# Patient Record
Sex: Female | Born: 1985 | Race: White | Hispanic: No | Marital: Married | Smoking: Former smoker
Health system: Southern US, Community
[De-identification: ages and names within clinical notes are randomized; demographics above are authoritative.]

## PROBLEM LIST (undated history)

## (undated) ENCOUNTER — Inpatient Hospital Stay: Payer: Self-pay

## (undated) DIAGNOSIS — E669 Obesity, unspecified: Secondary | ICD-10-CM

## (undated) DIAGNOSIS — A6000 Herpesviral infection of urogenital system, unspecified: Secondary | ICD-10-CM

## (undated) DIAGNOSIS — E049 Nontoxic goiter, unspecified: Secondary | ICD-10-CM

## (undated) DIAGNOSIS — F329 Major depressive disorder, single episode, unspecified: Secondary | ICD-10-CM

## (undated) DIAGNOSIS — O139 Gestational [pregnancy-induced] hypertension without significant proteinuria, unspecified trimester: Secondary | ICD-10-CM

## (undated) DIAGNOSIS — F909 Attention-deficit hyperactivity disorder, unspecified type: Secondary | ICD-10-CM

## (undated) DIAGNOSIS — F32A Depression, unspecified: Secondary | ICD-10-CM

## (undated) DIAGNOSIS — E66811 Obesity, class 1: Secondary | ICD-10-CM

## (undated) HISTORY — PX: THYROID SURGERY: SHX805

## (undated) HISTORY — PX: WISDOM TOOTH EXTRACTION: SHX21

## (undated) HISTORY — PX: BREAST ENHANCEMENT SURGERY: SHX7

## (undated) HISTORY — PX: DILATION AND CURETTAGE OF UTERUS: SHX78

## (undated) HISTORY — DX: Attention-deficit hyperactivity disorder, unspecified type: F90.9

---

## 2006-01-12 ENCOUNTER — Ambulatory Visit: Payer: Self-pay

## 2006-09-15 ENCOUNTER — Emergency Department (HOSPITAL_COMMUNITY): Admission: EM | Admit: 2006-09-15 | Discharge: 2006-09-15 | Payer: Self-pay | Admitting: Emergency Medicine

## 2006-09-22 ENCOUNTER — Emergency Department (HOSPITAL_COMMUNITY): Admission: EM | Admit: 2006-09-22 | Discharge: 2006-09-22 | Payer: Self-pay | Admitting: Emergency Medicine

## 2010-09-27 ENCOUNTER — Ambulatory Visit: Payer: Self-pay | Admitting: Otolaryngology

## 2010-10-12 ENCOUNTER — Ambulatory Visit: Payer: Self-pay | Admitting: Otolaryngology

## 2010-11-04 ENCOUNTER — Other Ambulatory Visit: Payer: Self-pay | Admitting: Otolaryngology

## 2011-08-09 ENCOUNTER — Other Ambulatory Visit: Payer: Self-pay

## 2011-08-16 ENCOUNTER — Ambulatory Visit: Payer: Self-pay | Admitting: Otolaryngology

## 2011-12-03 ENCOUNTER — Observation Stay: Payer: Self-pay | Admitting: Obstetrics and Gynecology

## 2011-12-03 LAB — COMPREHENSIVE METABOLIC PANEL
Alkaline Phosphatase: 131 U/L (ref 50–136)
Anion Gap: 14 (ref 7–16)
Calcium, Total: 8.5 mg/dL (ref 8.5–10.1)
Co2: 20 mmol/L — ABNORMAL LOW (ref 21–32)
EGFR (African American): >60
Glucose: 80 mg/dL (ref 65–99)
Osmolality: 285 (ref 275–301)
Potassium: 3.5 mmol/L (ref 3.5–5.1)
SGOT(AST): 25 U/L (ref 15–37)
SGPT (ALT): 33 U/L
Total Protein: 5.9 g/dL — ABNORMAL LOW (ref 6.4–8.2)

## 2011-12-03 LAB — CBC
Platelet: 199 10*3/uL (ref 150–440)
RBC: 3.64 10*6/uL — ABNORMAL LOW (ref 3.80–5.20)
RDW: 13.2 % (ref 11.5–14.5)
WBC: 9.4 10*3/uL (ref 3.6–11.0)

## 2011-12-04 ENCOUNTER — Ambulatory Visit: Payer: Self-pay

## 2011-12-04 LAB — URINALYSIS, COMPLETE
Bacteria: NONE SEEN
Bilirubin,UR: NEGATIVE
Blood: NEGATIVE
Glucose,UR: NEGATIVE mg/dL
Ketone: NEGATIVE
Leukocyte Esterase: NEGATIVE
Nitrite: NEGATIVE
Ph: 6
Protein: NEGATIVE
RBC,UR: NONE SEEN /HPF
Specific Gravity: 1.005
Squamous Epithelial: 2
WBC UR: NONE SEEN /HPF

## 2011-12-04 LAB — PROTEIN / CREATININE RATIO, URINE
Creatinine, Urine: 46.9 mg/dL (ref 30.0–125.0)
Protein, Random Urine: <5 mg/dL — ABNORMAL LOW (ref 0–12)

## 2011-12-05 ENCOUNTER — Ambulatory Visit: Payer: Self-pay | Admitting: Pediatric Cardiology

## 2011-12-06 ENCOUNTER — Encounter: Payer: Self-pay | Admitting: Pediatric Cardiology

## 2012-01-12 ENCOUNTER — Inpatient Hospital Stay: Payer: Self-pay

## 2012-01-12 LAB — PROTEIN / CREATININE RATIO, URINE: Protein, Random Urine: 267 mg/dL — ABNORMAL HIGH (ref 0–12)

## 2012-01-12 LAB — PIH PROFILE
Anion Gap: 9 (ref 7–16)
BUN: 9 mg/dL (ref 7–18)
Creatinine: 0.61 mg/dL (ref 0.60–1.30)
EGFR (Non-African Amer.): >60
MCH: 33.2 pg (ref 26.0–34.0)
MCV: 96 fL (ref 80–100)
Platelet: 173 10*3/uL (ref 150–440)
RBC: 3.73 10*6/uL — ABNORMAL LOW (ref 3.80–5.20)
RDW: 13 % (ref 11.5–14.5)
SGOT(AST): 26 U/L (ref 15–37)

## 2012-01-13 LAB — PROTEIN, URINE, 24 HOUR
Collection Hours: 24 hours
Total Volume: 5100 mL

## 2012-01-14 LAB — PIH PROFILE
BUN: 9 mg/dL (ref 7–18)
Creatinine: 0.59 mg/dL — ABNORMAL LOW (ref 0.60–1.30)
EGFR (African American): >60
EGFR (Non-African Amer.): >60
HCT: 37 % (ref 35.0–47.0)
MCH: 32.7 pg (ref 26.0–34.0)
MCHC: 33.7 g/dL (ref 32.0–36.0)
MCV: 97 fL (ref 80–100)
Potassium: 4 mmol/L (ref 3.5–5.1)
RDW: 13.4 % (ref 11.5–14.5)
SGOT(AST): 31 U/L (ref 15–37)
Uric Acid: 5.5 mg/dL (ref 2.6–6.0)

## 2012-01-16 LAB — CBC WITH DIFFERENTIAL/PLATELET
Basophil %: 0.1 %
Eosinophil %: 0.1 %
HCT: 39.8 % (ref 35.0–47.0)
HGB: 13.3 g/dL (ref 12.0–16.0)
Lymphocyte #: 2.2 10*3/uL (ref 1.0–3.6)
Lymphocyte %: 15.9 %
MCH: 32.6 pg (ref 26.0–34.0)
Monocyte #: 0.7 x10 3/mm (ref 0.2–0.9)
Neutrophil #: 10.8 10*3/uL — ABNORMAL HIGH (ref 1.4–6.5)
Neutrophil %: 78.9 %
RDW: 13.7 % (ref 11.5–14.5)

## 2012-01-18 LAB — HEMATOCRIT: HCT: 31.1 % — ABNORMAL LOW (ref 35.0–47.0)

## 2012-01-20 LAB — BASIC METABOLIC PANEL
Anion Gap: 10 (ref 7–16)
BUN: 10 mg/dL (ref 7–18)
Calcium, Total: 8.2 mg/dL — ABNORMAL LOW (ref 8.5–10.1)
Co2: 23 mmol/L (ref 21–32)
EGFR (Non-African Amer.): >60
Glucose: 93 mg/dL (ref 65–99)
Osmolality: 284 (ref 275–301)
Potassium: 3.7 mmol/L (ref 3.5–5.1)

## 2012-01-20 LAB — CBC WITH DIFFERENTIAL/PLATELET
Basophil %: 0.3 %
Eosinophil #: 0.1 10*3/uL (ref 0.0–0.7)
Eosinophil %: 0.7 %
HGB: 11 g/dL — ABNORMAL LOW (ref 12.0–16.0)
Lymphocyte %: 25.6 %
MCH: 33.6 pg (ref 26.0–34.0)
MCV: 98 fL (ref 80–100)
Monocyte #: 0.6 x10 3/mm (ref 0.2–0.9)
Neutrophil #: 5.8 10*3/uL (ref 1.4–6.5)
Platelet: 202 10*3/uL (ref 150–440)
RBC: 3.28 10*6/uL — ABNORMAL LOW (ref 3.80–5.20)
RDW: 13.6 % (ref 11.5–14.5)
WBC: 8.8 10*3/uL (ref 3.6–11.0)

## 2012-01-20 LAB — HEPATIC FUNCTION PANEL A (ARMC)
Albumin: 2.2 g/dL — ABNORMAL LOW (ref 3.4–5.0)
Alkaline Phosphatase: 143 U/L — ABNORMAL HIGH (ref 50–136)
Bilirubin,Total: 0.2 mg/dL (ref 0.2–1.0)
SGPT (ALT): 108 U/L — ABNORMAL HIGH
Total Protein: 5.5 g/dL — ABNORMAL LOW (ref 6.4–8.2)

## 2012-01-21 LAB — HEPATIC FUNCTION PANEL A (ARMC)
Alkaline Phosphatase: 158 U/L — ABNORMAL HIGH (ref 50–136)
SGPT (ALT): 99 U/L — ABNORMAL HIGH
Total Protein: 6.1 g/dL — ABNORMAL LOW (ref 6.4–8.2)

## 2013-09-30 ENCOUNTER — Ambulatory Visit: Payer: Self-pay | Admitting: Podiatry

## 2014-09-12 NOTE — L&D Delivery Note (Signed)
Delivery Note At 5:10 PM a viable female was delivered via C-Section, Low Transverse (Presentation: complete breech).  APGAR: 8, 9; weight 7 lb 2.6 oz (3250 g).   Placenta status: Intact, Manual removal.  Cord:  with the following complications: .  Cord pH: venous 7.20, arterial 7.15   Anesthesia: Spinal  Est. Blood Loss (mL): 600cc  See op note  For details  Mom to postpartum.  Baby to Couplet care / Skin to Skin.  ----- Ranae Plumber, MD Attending Obstetrician and Gynecologist [facility]

## 2014-11-04 LAB — OB RESULTS CONSOLE GC/CHLAMYDIA
Chlamydia: NEGATIVE
GC PROBE AMP, GENITAL: NEGATIVE

## 2014-11-13 LAB — OB RESULTS CONSOLE RUBELLA ANTIBODY, IGM: RUBELLA: UNDETERMINED

## 2014-11-13 LAB — OB RESULTS CONSOLE RPR: RPR: NONREACTIVE

## 2014-11-13 LAB — OB RESULTS CONSOLE HEPATITIS B SURFACE ANTIGEN: HEP B S AG: NEGATIVE

## 2014-11-13 LAB — OB RESULTS CONSOLE VARICELLA ZOSTER ANTIBODY, IGG: VARICELLA IGG: IMMUNE

## 2014-11-13 LAB — OB RESULTS CONSOLE HIV ANTIBODY (ROUTINE TESTING): HIV: NONREACTIVE

## 2015-01-04 NOTE — Consult Note (Signed)
PATIENT NAME:  Kimberly Knapp, Kimberly Knapp MR#:  [id number] DATE OF BIRTH:  Jan 06, 1986  DATE OF CONSULTATION:  01/20/2012  REFERRING PHYSICIAN:  Dr. Luella Cook from OB/GYN CONSULTING PHYSICIAN:  Krystal Eaton, MD  REASON FOR CONSULTATION: High blood pressure.    HISTORY OF PRESENT ILLNESS: Patient is a 29 year old Caucasian female with no history of hypertension who is postoperative day three of normal spontaneous vaginal delivery. Her pregnancy was complicated by preeclampsia with elevated blood pressure and proteinuria. This was started around 30 weeks pregnancy per patient. Patient was induced on Saturday and gave birth on Tuesday. Patient has had elevated blood pressure and currently is on labetalol. She is on 300 b.i.d. and it was increased yesterday to the current dose. Patient continues to have elevated blood pressure up to 180s/90s and medicine was consulted for further blood pressure management. Patient currently denies any chest pains, palpitations, shortness of breath, dizziness, abdominal pain. There no seizures. Patient did have a 24 hours of magnesium at least.   PAST MEDICAL HISTORY: History of thyroid nodule which was benign.   MEDICATIONS PREDELIVERY:  1. Prenatal vitamins. 2. Valtrex.  CURRENT MEDICATIONS: Currently she is on: 1. Colace 100 mg b.i.d.  2. Labetalol 300 mg b.i.d.  3. Prenatal vitamin 1 tab daily.  4. Ambien 10 mg at bedtime.  5. Tylenol p.r.n.  6. Norco 1 to 2 tabs q.4-6 hours p.r.n.  7. Magnesium hydroxide 30 mL p.r.n. for constipation. 8. Preparation H suppository one suppository rectally t.i.d. p.r.n. for hemorrhoids. 9. Epifoam cream.   ALLERGIES: Iodine and shrimp.   SOCIAL HISTORY: Denies tobacco, alcohol or drug use.   FAMILY HISTORY: Several family members with preeclampsia including her mom and aunt. History of lung cancer and heart disease in granddad.   REVIEW OF SYSTEMS: CONSTITUTIONAL: No fever, fatigue. No weakness. EYES: No blurry vision, double  vision. ENT: No headaches, hearing loss, or tinnitus. RESPIRATORY: No cough, wheezing, shortness of breath or chest pains. CARDIOVASCULAR: No chest pain, orthopnea. Had increased lower extremity edema during pregnancy. GASTROINTESTINAL: No nausea, vomiting, diarrhea. No abdominal pain. ENDOCRINE: Denies polyuria, nocturia. HEME/LYMPH: No anemia or easy bruising. SKIN: No rashes. MUSCULOSKELETAL: No arthritis. NEUROLOGIC: Denies numbness, transient ischemic attack, or CVA. PSYCHIATRIC: Denies anxiety or insomnia.   PHYSICAL EXAMINATION:  VITAL SIGNS: Temperature 97.8, pulse rate 73, respiratory rate 18, blood pressure 185/91, last was 163/81, highest diastolic noted to be 102 early afternoon, oxygen sats 98% on room air.   GENERAL: Patient is a mildly obese Caucasian female, lying in bed, no obvious distress, talking in full sentences.   HEENT: Normocephalic, atraumatic. Anicteric sclerae. Moist mucous membranes.   NECK: Supple. No JVD.   CARDIOVASCULAR: S1, S2. No murmurs, rubs, or gallops.   LUNGS: Clear to auscultation without wheezing, rhonchi.   ABDOMEN: Soft, nontender, nondistended. Positive hypoactive bowel sounds in all quadrants.   EXTREMITIES: 2+ pitting edema to below the knees.  NEUROLOGICAL: Cranial nerves II through XII grossly intact. Strength is 5/5 in all extremities.   PSYCH: Awake, alert, oriented x3, pleasant.   LABORATORY, DIAGNOSTIC AND RADIOLOGICAL DATA: Creatinine 0.59 on 05/04. Chloride 112. Hematocrit on 05/08 was 31.1, it was a 39.8 05/06. Platelets 208 as of 05/06. 24 hour urine creatinine was elevated at 1142. Urine creatinine spot was 22.4. 24 hour urine protein was 2907. RPR nonreactive from 05/06.  ASSESSMENT AND PLAN: We have a 29 year old Caucasian female status post spontaneous vaginal delivery of baby girl with complicated pregnancy by preeclampsia who has had elevated blood pressures  since week 30 of pregnancy. Patient still has elevated blood pressure  currently and is on labetalol. At this point we would increase labetalol to 300 mg every eight hours and can increase this further depending if heart rate can tolerate. Patient has no symptoms of chest pains, dizziness, headaches, palpitations. If the heart rate cannot tolerate increased labetalol we can add a second agent of nifedipine and re-evaluate blood pressure to keep systolic less than 150 and diastolic less than 90. Would also check a CBC, BMP and liver function tests to evaluate for possible sequelae of the hypertension. Patient did receive magnesium for the preeclampsia. High blood pressure post delivery and in a preeclamptic patient could be present for weeks after delivery. The blood pressure should be monitored here and also post discharge within one week and medications titrated as needed. Would defer to OB/GYN for further care.   Thank you for the opportunity to see this patient and would follow along with you.  ____________________________ Krystal EatonShayiq Tanisha Lutes, MD sa:cms D: 01/20/2012 20:00:36 ET T: 01/21/2012 14:33:33 ET JOB#: [id number]  cc: Krystal EatonShayiq Selenne Coggin, MD, <Dictator> Deloris PingPhilip J. Luella Cookosenow, MD Krystal EatonSHAYIQ Tashara Suder MD ELECTRONICALLY SIGNED 01/27/2012 16:30

## 2015-01-20 NOTE — H&P (Signed)
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  She denies headaches, visual disturbances, and RUQ pain.  She has had a baseline 24-hour urine total protein on 3/19 that returned as 167mg .  Also, of note she was recently seen by [facility] perinatal for asymmetric fetal growth (head circumference <2%ile) and had noted fetal cardiac dysrythmia.  She notes +FM, no contractions, no vaginal bleeding and no leakage of fluid.O+, R Eq, VZI, RPR NR, NGsAg neg, GBS unknown.    Presents with increased BPs, some in severe range    Patient's Medical History HSV    Patient's Surgical History D&C  Breast augmentation, Neck Surgery, Wisdom Teeth Extraction    Medications Pre Natal Vitamins    Allergies Iodine, shellfish    Social History Former tobacco user    Family History Non-Contributory   ROS:   ROS All systems were reviewed.  HEENT, CNS, GI, GU, Respiratory, CV, Renal and Musculoskeletal systems were found to be normal.   Exam:   Vital Signs 126-152/67-90    General no apparent distress    Mental Status clear    Chest clear    Heart normal sinus rhythm    Abdomen gravid, non-tender    Estimated Fetal Weight Average for gestational age    Back no CVAT    Reflexes 2+    Mebranes Intact    FHT normal rate with no decels    FHT Description 135/mod var/+accels/no decels    Ucx absent   Impression:   Impression evaluation for gestational hypertension, early 3rd trimester   Plan:   Plan EFM/NST, monitor BP, PIH panel    Comments Will monitor BPs.  Given her report of severe-range blood pressures with minimal activity at home (folding clothes), I am concerned that she may quickly develop preeclampsia early in her pregnancy.  I will go ahead and administer betamethasone tonight in case she quickly progresses to severe preeclampsia.  Discussed  risks/benefits of bmtz at length with patient and her family.   Electronic Signatures: Conard NovakJackson, Clary Meeker D (MD)  (Signed 24-Mar-13 00:03)  Authored: L&D Evaluation   Last Updated: 24-Mar-13 00:03 by Conard NovakJackson, Monda Chastain D (MD)

## 2015-01-20 NOTE — H&P (Signed)
L&D Evaluation:  History:   HPI 29 [facility]4P0030 [facility] clinic for evaluation of PIH. BP in clinic 142/80, 2+ proteinuria and 5lbs weight gain in 1 week.  Patient reports HA but has had HA off and on throughout pregnancy.  These have been self limited and have not changed in character.  She denies vision changes, RUQ or epigastric pain, some increase in edema.  +FM, no LOF, no VB. no CTX.  Patient has been followed in high risk clinic for hyperthyroidsim and BMI 31.  She had an HSV outbreak at 19 weeks and continues on daily supression.  She had several elevated BP's some in severe range at 30 weeks.  Collected a 24-hr urine during that admission 167mg , s/p BMZ on 3/23 & 3/24. Fetus with PAC's evaluated by [facility], aslo US at 30 weeks with asymetric growth with small head circumference seen by DP and felt to be normal not requiring further work up.  PNL: O+ / ABSC neg / RI / VZI / RPR NR / HIV neg / HBsAg neg / 1-hr OGTT 112.  Last TSH 1.24 with FT4 of 1.05, Antithyroglobulin antibodies of <20IU/mL.    Presents with Evalutate for preeclampsia    Patient's Medical History Thyroid Disease  HSV    Patient's Surgical History D&C  breast augmentation, thryoid biopsy, wisdom teeth extraction    Medications Pre Natal Vitamins    Allergies Shellfish, iodine    Social History none    Family History Non-Contributory   Exam:   Vital Signs BP >140/90    Urine Protein 2+    General no apparent distress    Mental Status clear    Abdomen gravid, non-tender    Estimated Fetal Weight Average for gestational age    Fetal Position vtx    Edema 1+    Reflexes 2+    Clonus negative    FHT normal rate with no decels    Ucx absent   Impression:   Impression evaluation for PIH   Plan:   Comments 1) BP elevation - will obtain pre-eclampsia labs, pending review of serial BP's will decide on inpatient vs outpatient 24-hr urine protein  colection.  Addendum: PIH panel normal with exception of uric acid of 5.2 elevated for pregnancy as well as P/C ratio of 2151. Will admit for 24-hr urine nd serial BP's.  BP's have been running mild range 140-150/90's.   Electronic Signatures: Lorrene ReidStaebler, Larae Caison M (MD)  (Signed [phone]-May-13 18:09)  Authored: L&D Evaluation, Consent   Last Updated: 02-May-13 18:09 by Lorrene ReidStaebler, Gearold Wainer M (MD)

## 2015-05-19 LAB — OB RESULTS CONSOLE GBS: STREP GROUP B AG: NEGATIVE

## 2015-05-20 LAB — OB RESULTS CONSOLE GC/CHLAMYDIA
CHLAMYDIA, DNA PROBE: NEGATIVE
Gonorrhea: NEGATIVE

## 2015-05-27 ENCOUNTER — Encounter
Admission: RE | Admit: 2015-05-27 | Discharge: 2015-05-27 | Disposition: A | Discharge status: Home / Self Care | Payer: BLUE CROSS/BLUE SHIELD | Source: Ambulatory Visit | Attending: Obstetrics and Gynecology | Admitting: Obstetrics and Gynecology

## 2015-05-27 LAB — CBC
HCT: 35.9 % (ref 35.0–47.0)
Hemoglobin: 11.8 g/dL — ABNORMAL LOW (ref 12.0–16.0)
MCH: 29.9 pg (ref 26.0–34.0)
MCHC: 32.9 g/dL (ref 32.0–36.0)
MCV: 90.8 fL (ref 80.0–100.0)
PLATELETS: 255 10*3/uL (ref 150–440)
RBC: 3.95 MIL/uL (ref 3.80–5.20)
RDW: 12.9 % (ref 11.5–14.5)
WBC: 9.2 10*3/uL (ref 3.6–11.0)

## 2015-05-27 LAB — RAPID HIV SCREEN (HIV 1/2 AB+AG)
HIV 1/2 ANTIBODIES: NONREACTIVE
HIV-1 P24 ANTIGEN - HIV24: NONREACTIVE

## 2015-05-27 LAB — TYPE AND SCREEN
ABO/RH(D): O POS
Antibody Screen: NEGATIVE

## 2015-05-27 LAB — ABO/RH: ABO/RH(D): O POS

## 2015-05-27 NOTE — Procedures (Signed)
Kimberly Knapp   Your procedure is scheduled on: date 05/28/2015 at 10am   Report to Birthplace at [facility]             [address]             [place], [place] [address]  Call this number if you have problems the morning of surgery: [phone]   Remember:   Do not eat food or drink liquids after midnight.     Do not wear jewelry, make-up or nail polish.  Do not wear lotions, powders, or perfumes. You may wear deodorant.  Do not shave prior to surgery.  Do not bring valuables to the hospital.  [facility] is not responsible for any            belongings or valuables.                Contacts, dentures or bridgework may not be worn into surgery.  Leave suitcase in the car. After surgery it may be brought to your room.  For patients admitted to the hospital, discharge time is determined by your             treatment team.               Special Instructions: Preparing the skin before Cesarean Section              To help prevent the risk of infection at your surgical site, we are providing             you with rinse-free Sage 2% Chlorhexidine Gluconate (HCG) disposable             wipes.               The night before surgery:              1. Shower or bathe with warm water             2. Do not apply lotion or perfume             3. Wait one hour after shower, skin should be dry and cool             4. Open Sage wipe package - 2 disposable cloths are inside             5. Wipe the lower abdomen from the pubic line to the navel and hip bone to hip             bone with one cloth             6. Use the second cloth to wipe the front of the upper thighs             7. Allow the area to dry for one minute. DO NOT RINSE             8. Skin may feel "tacky" for several minutes             9. Dress in freshly laundered, clean clothes           10. Do not shower the morning of surgery    Please read over the following  fact sheets that you were given: Coughing and  Deep Breathing and Surgical Site Infection Prevention

## 2015-05-28 ENCOUNTER — Inpatient Hospital Stay: Payer: BLUE CROSS/BLUE SHIELD | Admitting: Anesthesiology

## 2015-05-28 ENCOUNTER — Encounter
Admission: RE | Disposition: A | Discharge status: Home / Self Care | Payer: Self-pay | Source: Ambulatory Visit | Attending: Obstetrics and Gynecology

## 2015-05-28 ENCOUNTER — Encounter: Payer: Self-pay | Admitting: Anesthesiology

## 2015-05-28 ENCOUNTER — Inpatient Hospital Stay
Admission: RE | Admit: 2015-05-28 | Discharge: 2015-05-31 | DRG: 765 | Disposition: A | Discharge status: Home / Self Care | Payer: BLUE CROSS/BLUE SHIELD | Source: Ambulatory Visit | Attending: Obstetrics and Gynecology | Admitting: Obstetrics and Gynecology

## 2015-05-28 DIAGNOSIS — Z8249 Family history of ischemic heart disease and other diseases of the circulatory system: Secondary | ICD-10-CM

## 2015-05-28 DIAGNOSIS — E669 Obesity, unspecified: Secondary | ICD-10-CM | POA: Diagnosis present

## 2015-05-28 DIAGNOSIS — Z888 Allergy status to other drugs, medicaments and biological substances status: Secondary | ICD-10-CM | POA: Diagnosis not present

## 2015-05-28 DIAGNOSIS — Z9889 Other specified postprocedural states: Secondary | ICD-10-CM

## 2015-05-28 DIAGNOSIS — Z87891 Personal history of nicotine dependence: Secondary | ICD-10-CM

## 2015-05-28 DIAGNOSIS — O321XX0 Maternal care for breech presentation, not applicable or unspecified: Secondary | ICD-10-CM | POA: Diagnosis present

## 2015-05-28 DIAGNOSIS — Z7982 Long term (current) use of aspirin: Secondary | ICD-10-CM | POA: Diagnosis not present

## 2015-05-28 DIAGNOSIS — Z3A37 37 weeks gestation of pregnancy: Secondary | ICD-10-CM | POA: Diagnosis present

## 2015-05-28 DIAGNOSIS — Z801 Family history of malignant neoplasm of trachea, bronchus and lung: Secondary | ICD-10-CM

## 2015-05-28 DIAGNOSIS — O133 Gestational [pregnancy-induced] hypertension without significant proteinuria, third trimester: Secondary | ICD-10-CM | POA: Diagnosis present

## 2015-05-28 DIAGNOSIS — Z833 Family history of diabetes mellitus: Secondary | ICD-10-CM

## 2015-05-28 DIAGNOSIS — O0990 Supervision of high risk pregnancy, unspecified, unspecified trimester: Secondary | ICD-10-CM

## 2015-05-28 DIAGNOSIS — Z6833 Body mass index (BMI) 33.0-33.9, adult: Secondary | ICD-10-CM

## 2015-05-28 DIAGNOSIS — D500 Iron deficiency anemia secondary to blood loss (chronic): Secondary | ICD-10-CM | POA: Diagnosis present

## 2015-05-28 DIAGNOSIS — Z79899 Other long term (current) drug therapy: Secondary | ICD-10-CM

## 2015-05-28 DIAGNOSIS — O99214 Obesity complicating childbirth: Secondary | ICD-10-CM | POA: Diagnosis present

## 2015-05-28 DIAGNOSIS — A6000 Herpesviral infection of urogenital system, unspecified: Secondary | ICD-10-CM | POA: Diagnosis present

## 2015-05-28 HISTORY — DX: Herpesviral infection of urogenital system, unspecified: A60.00

## 2015-05-28 HISTORY — DX: Gestational (pregnancy-induced) hypertension without significant proteinuria, unspecified trimester: O13.9

## 2015-05-28 HISTORY — DX: Nontoxic goiter, unspecified: E04.9

## 2015-05-28 HISTORY — DX: Depression, unspecified: F32.A

## 2015-05-28 HISTORY — DX: Obesity, class 1: E66.811

## 2015-05-28 HISTORY — DX: Obesity, unspecified: E66.9

## 2015-05-28 HISTORY — DX: Major depressive disorder, single episode, unspecified: F32.9

## 2015-05-28 LAB — RPR: RPR Ser Ql: NONREACTIVE

## 2015-05-28 SURGERY — Surgical Case
Anesthesia: Epidural

## 2015-05-28 MED ORDER — LACTATED RINGERS IV SOLN: 500.0000 mL | INTRAVENOUS | Status: DC | PRN

## 2015-05-28 MED ORDER — CITRIC ACID-SODIUM CITRATE 334-500 MG/5ML PO SOLN: 30.0000 mL | ORAL | Status: DC | PRN

## 2015-05-28 MED ORDER — BUPIVACAINE HCL 0.5 % IJ SOLN
5.0000 mL | Freq: Once | INTRAMUSCULAR | Status: DC
  Filled 2015-05-28: qty 5

## 2015-05-28 MED ORDER — ONDANSETRON HCL 4 MG/2ML IJ SOLN: 4.0000 mg | Freq: Four times a day (QID) | INTRAMUSCULAR | Status: DC | PRN

## 2015-05-28 MED ORDER — CALCIUM CARBONATE ANTACID 500 MG PO CHEW
CHEWABLE_TABLET | ORAL | Status: AC
  Administered 2015-05-28: 500 mg via ORAL
  Filled 2015-05-28: qty 2

## 2015-05-28 MED ORDER — DINOPROSTONE 10 MG VA INST
10.0000 mg | VAGINAL_INSERT | Freq: Once | VAGINAL | Status: AC
  Administered 2015-05-28: 10 mg via VAGINAL
  Filled 2015-05-28: qty 1

## 2015-05-28 MED ORDER — OXYTOCIN 10 UNIT/ML IJ SOLN: 10.0000 [IU] | Freq: Once | INTRAMUSCULAR | Status: DC

## 2015-05-28 MED ORDER — LACTATED RINGERS IV SOLN
Freq: Once | INTRAVENOUS | Status: AC
  Administered 2015-05-28: 07:00:00 via INTRAVENOUS

## 2015-05-28 MED ORDER — CALCIUM CARBONATE ANTACID 500 MG PO CHEW
1.0000 | CHEWABLE_TABLET | Freq: Three times a day (TID) | ORAL | Status: DC | PRN
Start: 2015-05-28 — End: 2015-05-31
  Administered 2015-05-28: 500 mg via ORAL

## 2015-05-28 MED ORDER — OXYTOCIN 40 UNITS IN LACTATED RINGERS INFUSION - SIMPLE MED
62.5000 mL/h | INTRAVENOUS | Status: DC
  Filled 2015-05-28 (×2): qty 1000

## 2015-05-28 MED ORDER — BUPIVACAINE 0.25 % ON-Q PUMP DUAL CATH 400 ML: 400.0000 mL | INJECTION | Status: DC

## 2015-05-28 MED ORDER — CITRIC ACID-SODIUM CITRATE 334-500 MG/5ML PO SOLN: 30.0000 mL | ORAL | Status: DC

## 2015-05-28 MED ORDER — OXYTOCIN BOLUS FROM INFUSION: 500.0000 mL | INTRAVENOUS | Status: DC

## 2015-05-28 MED ORDER — LACTATED RINGERS IV SOLN
INTRAVENOUS | Status: DC
  Administered 2015-05-28: 07:00:00 via INTRAVENOUS

## 2015-05-28 MED ORDER — CEFAZOLIN SODIUM-DEXTROSE 2-3 GM-% IV SOLR: 2.0000 g | INTRAVENOUS | Status: DC

## 2015-05-28 MED ORDER — TERBUTALINE SULFATE 1 MG/ML IJ SOLN: 0.2500 mg | Freq: Once | INTRAMUSCULAR | Status: DC | PRN

## 2015-05-28 MED ORDER — LIDOCAINE HCL (PF) 1 % IJ SOLN
30.0000 mL | INTRAMUSCULAR | Status: DC | PRN
  Filled 2015-05-28: qty 30

## 2015-05-28 MED ORDER — LACTATED RINGERS IV SOLN
INTRAVENOUS | Status: DC
  Administered 2015-05-29: 125 mL/h via INTRAVENOUS
  Administered 2015-05-29: 16:00:00 via INTRAVENOUS

## 2015-05-28 SURGICAL SUPPLY — 26 items
CANISTER SUCT 3000ML (MISCELLANEOUS) IMPLANT
CATH KIT ON-Q SILVERSOAK 5IN (CATHETERS) IMPLANT
DRSG TELFA 3X8 NADH (GAUZE/BANDAGES/DRESSINGS) IMPLANT
ELECT CAUTERY BLADE 6.4 (BLADE) IMPLANT
GAUZE SPONGE 4X4 12PLY STRL (GAUZE/BANDAGES/DRESSINGS) IMPLANT
GLOVE BIO SURGEON STRL SZ7 (GLOVE) IMPLANT
GLOVE INDICATOR 7.5 STRL GRN (GLOVE) IMPLANT
GOWN STRL REUS W/ TWL LRG LVL3 (GOWN DISPOSABLE) IMPLANT
GOWN STRL REUS W/TWL LRG LVL3 (GOWN DISPOSABLE)
LIQUID BAND (GAUZE/BANDAGES/DRESSINGS) IMPLANT
NS IRRIG 1000ML POUR BTL (IV SOLUTION) IMPLANT
PACK C SECTION AR (MISCELLANEOUS) IMPLANT
PAD GROUND ADULT SPLIT (MISCELLANEOUS) IMPLANT
PAD OB MATERNITY 4.3X12.25 (PERSONAL CARE ITEMS) IMPLANT
PAD PREP 24X41 OB/GYN DISP (PERSONAL CARE ITEMS) IMPLANT
SPONGE LAP 18X18 5 PK (GAUZE/BANDAGES/DRESSINGS) IMPLANT
STRIP CLOSURE SKIN 1/2X4 (GAUZE/BANDAGES/DRESSINGS) IMPLANT
SUT CHROMIC GUT BROWN 0 54 (SUTURE) IMPLANT
SUT CHROMIC GUT BROWN 0 54IN (SUTURE)
SUT MNCRL 4-0 (SUTURE)
SUT MNCRL 4-0 27XMFL (SUTURE)
SUT PDS AB 1 TP1 96 (SUTURE) IMPLANT
SUT PLAIN 2 0 XLH (SUTURE) IMPLANT
SUT VIC AB 0 CT1 36 (SUTURE) IMPLANT
SUTURE MNCRL 4-0 27XMF (SUTURE) IMPLANT
SWABSTK COMLB BENZOIN TINCTURE (MISCELLANEOUS) IMPLANT

## 2015-05-28 NOTE — H&P (Signed)
OB History & Physical   History of Present Illness:  Chief Complaint: Presents for external cephalic version and either induction or cesarean delivery  HPI:  Kimberly Knapp is a 29 y.o. [id number] female at [redacted]w[redacted]d dated by her LMP consistent with an 8 week ultrasound.  Her pregnancy has been complicated by obesity with an initial BMI of 33, a history of preeclampsia with severe features requiring induction at 35 weeks, depression, herpes genitalis, gestational hypertension in the current pregnancy..    She denies contractions.   She denies leakage of fluid.   She denies vginal bleeding.   She reports fetal movement.  She denies headaches, visual disturbances, and RUQ pain.  She denies any current symptoms of active or a prodromal outbreak of herpes.  Maternal Medical History:   Past Medical History  Diagnosis Date  . Pregnancy induced hypertension   . Depression   . Obesity (BMI 30.0-34.9)   . Goiter   . Herpes genitalis     Past Surgical History  Procedure Laterality Date  . Breast enhancement surgery Bilateral   . Dilation and curettage of uterus    . Thyroid surgery      thyroid biopsy  . Wisdom tooth extraction      Allergies  Allergen Reactions  . Iodine Swelling  . Shrimp [Shellfish Allergy] Swelling    Prior to Admission medications   Medication Sig Start Date End Date Taking? Authorizing Provider  aspirin 81 MG tablet Take 81 mg by mouth daily.   Yes Historical Provider, MD  Prenat w/o A-FeCbn-Meth-FA-DHA (PRENATE MINI PO) Take 1 tablet by mouth daily.   Yes Historical Provider, MD  ranitidine (ZANTAC) 150 MG capsule Take 150 mg by mouth 2 (two) times daily.   Yes Historical Provider, MD  valACYclovir (VALTREX) 1000 MG tablet Take 1,000 mg by mouth daily.   Yes Historical Provider, MD  Witch Hazel (PREPARATION H EX) Apply 1 application topically daily as needed.    Historical Provider, MD    OB History  Gravida Para Term Preterm AB SAB TAB Ectopic Multiple Living   5 1 0 # Outcome Date GA Lbr Len/2nd Weight Sex Delivery Anes PTL Lv  5 Current           4 SAB           3 SAB           2 SAB           1 Preterm               Prenatal care site: [facility]  Social History: She  reports that she has quit smoking. She has never used smokeless tobacco. She reports that she does not drink alcohol or use illicit drugs.  Family History: family history includes Diabetes type II in an other family member; Heart disease in an other family member; Lung cancer in some other family members.   Review of Systems: Negative x 10 systems reviewed except as noted in the HPI.    Physical Exam:  Vital Signs: BP 125/70 mmHg  Pulse 84  Temp(Src) 98 F (36.7 C) (Oral)  LMP 09/10/2014 General: no acute distress.  HEENT: normocephalic, atraumatic Heart: regular rate & rhythm.  No murmurs/rubs/gallops Lungs: clear to auscultation bilaterally Abdomen: soft, gravid, non-tender;  EFW: 7.5 pounds Pelvic:   External: Normal external female genitalia. No evidence of herpetic lesions.  Cervix: Dilation: Closed / Effacement (%):  50 / Station: -3  Extremities: non-tender, symmetric, trace edema bilaterally.  DTRs: 2+  Neurologic: Alert & oriented x 3.    Pertinent Results:  Prenatal Labs: Blood type/Rh O positive  Antibody screen negative  Rubella Not immune  RPR Non-reactive  HBsAg negative  HIV negative  GC negative  Chlamydia negative  Genetic screening Declined   1 hour GTT Early (88), 28 week 109  3 hour GTT n/a  GBS negative on 05/19/15   Baseline FHR: 130 beats/min   Variability: moderate   Accelerations: present   Decelerations: absent Contractions: present, frequency: infrequent, irregular Overall assessment: category 1  Bedside Ultrasound:  Number of Fetus: 1  Presentation: cephalic  Fluid: grossly normal  Placental Location: anterior  Assessment:  Kimberly Knapp is a 29 y.o. [id number] female at [redacted]w[redacted]d with gestational  hypertension.   Plan:  1. Admit to Labor & Delivery  2. CBC, T&S, NPO initially with likely transition to clears, IVF 3. GBS negative.   4. Fetwal well-being: reassuring  5. No need for external cephalic version as fetus is cephalic.  Will plan for induction of labor with either cervidil or cytotec.  Discussed that induction with cytotec is an off-label use of the medication.  Will leave to Dr. Bonney Aid as he is on-call today.  Patient is accepting of either method.   6. Herpes genitalis: no evidence of current outbreak and patient currently on suppressive medication.   Conard Novak, MD 05/28/2015 8:35 AM

## 2015-05-28 NOTE — Anesthesia Preprocedure Evaluation (Addendum)
Anesthesia Evaluation  Patient identified by MRN, date of birth, ID band Patient awake    Reviewed: Allergy & Precautions, H&P , NPO status , Patient's Chart, lab work & pertinent test results  History of Anesthesia Complications Negative for: history of anesthetic complications  Airway Mallampati: II  TM Distance: >3 FB Neck ROM: full    Dental no notable dental hx. (+) Teeth Intact   Pulmonary neg shortness of breath, former smoker,    Pulmonary exam normal breath sounds clear to auscultation       Cardiovascular Exercise Tolerance: Good hypertension, Normal cardiovascular exam Rhythm:regular Rate:Normal     Neuro/Psych PSYCHIATRIC DISORDERS Depression negative neurological ROS  negative psych ROS   GI/Hepatic Neg liver ROS, GERD  Controlled,  Endo/Other  negative endocrine ROS  Renal/GU negative Renal ROS  negative genitourinary   Musculoskeletal   Abdominal   Peds  Hematology negative hematology ROS (+)   Anesthesia Other Findings 29 y.o. [id number] female at [redacted]w[redacted]d dated by her LMP consistent with an 8 week ultrasound. Her pregnancy has been complicated by obesity with an initial BMI of 33, a history of preeclampsia with severe features requiring induction at 35 weeks, depression, herpes genitalis, gestational hypertension in the current pregnancy..   Reproductive/Obstetrics negative OB ROS                            Anesthesia Physical Anesthesia Plan  ASA: III  Anesthesia Plan: Spinal, Epidural and General   Post-op Pain Management:    Induction:   Airway Management Planned:   Additional Equipment:   Intra-op Plan:   Post-operative Plan:   Informed Consent: I have reviewed the patients History and Physical, chart, labs and discussed the procedure including the risks, benefits and alternatives for the proposed anesthesia with the patient or authorized representative who has  indicated his/her understanding and acceptance.   Dental Advisory Given  Plan Discussed with: Anesthesiologist, CRNA and Surgeon  Anesthesia Plan Comments:         Anesthesia Quick Evaluation

## 2015-05-29 ENCOUNTER — Inpatient Hospital Stay: Payer: BLUE CROSS/BLUE SHIELD | Admitting: Anesthesiology

## 2015-05-29 ENCOUNTER — Encounter: Payer: Self-pay | Admitting: Anesthesiology

## 2015-05-29 ENCOUNTER — Encounter
Admission: RE | Disposition: A | Discharge status: Home / Self Care | Payer: Self-pay | Source: Ambulatory Visit | Attending: Obstetrics and Gynecology

## 2015-05-29 LAB — COMPREHENSIVE METABOLIC PANEL
ALK PHOS: 195 U/L — AB (ref 38–126)
ALT: 17 U/L (ref 14–54)
ANION GAP: 6 (ref 5–15)
AST: 27 U/L (ref 15–41)
Albumin: 2.9 g/dL — ABNORMAL LOW (ref 3.5–5.0)
BILIRUBIN TOTAL: 0.7 mg/dL (ref 0.3–1.2)
BUN: 5 mg/dL — ABNORMAL LOW (ref 6–20)
CALCIUM: 8.8 mg/dL — AB (ref 8.9–10.3)
CO2: 21 mmol/L — AB (ref 22–32)
CREATININE: 0.58 mg/dL (ref 0.44–1.00)
Chloride: 110 mmol/L (ref 101–111)
Glucose, Bld: 75 mg/dL (ref 65–99)
Potassium: 3.3 mmol/L — ABNORMAL LOW (ref 3.5–5.1)
Sodium: 137 mmol/L (ref 135–145)
TOTAL PROTEIN: 6.2 g/dL — AB (ref 6.5–8.1)

## 2015-05-29 LAB — CBC
HEMATOCRIT: 35.1 % (ref 35.0–47.0)
HEMOGLOBIN: 11.6 g/dL — AB (ref 12.0–16.0)
MCH: 30 pg (ref 26.0–34.0)
MCHC: 33.2 g/dL (ref 32.0–36.0)
MCV: 90.4 fL (ref 80.0–100.0)
Platelets: 236 10*3/uL (ref 150–440)
RBC: 3.88 MIL/uL (ref 3.80–5.20)
RDW: 12.6 % (ref 11.5–14.5)
WBC: 9.7 10*3/uL (ref 3.6–11.0)

## 2015-05-29 SURGERY — Surgical Case
Anesthesia: Epidural | Site: Abdomen | Wound class: Clean Contaminated

## 2015-05-29 MED ORDER — FENTANYL CITRATE (PF) 100 MCG/2ML IJ SOLN
INTRAMUSCULAR | Status: DC | PRN
  Administered 2015-05-29: 15 ug via INTRATHECAL

## 2015-05-29 MED ORDER — SIMETHICONE 80 MG PO CHEW
80.0000 mg | CHEWABLE_TABLET | Freq: Three times a day (TID) | ORAL | Status: DC
Start: 2015-05-30 — End: 2015-05-31
  Administered 2015-05-30 – 2015-05-31 (×3): 80 mg via ORAL
  Filled 2015-05-29 (×3): qty 1

## 2015-05-29 MED ORDER — MISOPROSTOL 25 MCG QUARTER TABLET
25.0000 ug | ORAL_TABLET | ORAL | Status: DC | PRN
  Administered 2015-05-29: 25 ug via ORAL
  Filled 2015-05-29: qty 1

## 2015-05-29 MED ORDER — BUPIVACAINE HCL 0.5 % IJ SOLN: INTRAMUSCULAR | Status: DC | PRN

## 2015-05-29 MED ORDER — CITRIC ACID-SODIUM CITRATE 334-500 MG/5ML PO SOLN
30.0000 mL | ORAL | Status: AC
  Administered 2015-05-29: 30 mL via ORAL
  Filled 2015-05-29: qty 30

## 2015-05-29 MED ORDER — MEPERIDINE HCL 25 MG/ML IJ SOLN: 6.2500 mg | INTRAMUSCULAR | Status: DC | PRN

## 2015-05-29 MED ORDER — SODIUM CHLORIDE 0.9 % IJ SOLN: 3.0000 mL | INTRAMUSCULAR | Status: DC | PRN

## 2015-05-29 MED ORDER — BUPIVACAINE 0.25 % ON-Q PUMP DUAL CATH 400 ML
400.0000 mL | INJECTION | Status: DC
  Filled 2015-05-29: qty 400

## 2015-05-29 MED ORDER — ONDANSETRON HCL 4 MG/2ML IJ SOLN
4.0000 mg | Freq: Three times a day (TID) | INTRAMUSCULAR | Status: DC | PRN
Start: 2015-05-29 — End: 2015-05-29

## 2015-05-29 MED ORDER — NALBUPHINE HCL 10 MG/ML IJ SOLN: 5.0000 mg | Freq: Once | INTRAMUSCULAR | Status: DC | PRN

## 2015-05-29 MED ORDER — KETOROLAC TROMETHAMINE 30 MG/ML IJ SOLN
INTRAMUSCULAR | Status: AC
  Administered 2015-05-29: 30 mg via INTRAVENOUS
  Filled 2015-05-29: qty 8

## 2015-05-29 MED ORDER — MISOPROSTOL 25 MCG QUARTER TABLET: 25.0000 ug | ORAL_TABLET | ORAL | Status: DC | PRN

## 2015-05-29 MED ORDER — SIMETHICONE 80 MG PO CHEW: 80.0000 mg | CHEWABLE_TABLET | ORAL | Status: DC | PRN

## 2015-05-29 MED ORDER — OXYTOCIN 10 UNIT/ML IJ SOLN
INTRAMUSCULAR | Status: AC
Start: 2015-05-29 — End: 2015-05-29
  Filled 2015-05-29: qty 2

## 2015-05-29 MED ORDER — CEFAZOLIN SODIUM-DEXTROSE 2-3 GM-% IV SOLR
2.0000 g | INTRAVENOUS | Status: AC
  Administered 2015-05-29: 2 g via INTRAVENOUS
  Filled 2015-05-29: qty 50

## 2015-05-29 MED ORDER — OXYTOCIN 40 UNITS IN LACTATED RINGERS INFUSION - SIMPLE MED
INTRAVENOUS | Status: AC
  Filled 2015-05-29: qty 1000

## 2015-05-29 MED ORDER — PHENYLEPHRINE HCL 10 MG/ML IJ SOLN
INTRAMUSCULAR | Status: DC | PRN
  Administered 2015-05-29: 100 ug via INTRAVENOUS

## 2015-05-29 MED ORDER — LACTATED RINGERS IV SOLN
INTRAVENOUS | Status: DC
  Administered 2015-05-30: 06:00:00 via INTRAVENOUS

## 2015-05-29 MED ORDER — NALBUPHINE HCL 10 MG/ML IJ SOLN: 5.0000 mg | INTRAMUSCULAR | Status: DC | PRN

## 2015-05-29 MED ORDER — IBUPROFEN 600 MG PO TABS
600.0000 mg | ORAL_TABLET | Freq: Four times a day (QID) | ORAL | Status: DC | PRN
  Administered 2015-05-30 – 2015-05-31 (×4): 600 mg via ORAL
  Filled 2015-05-29 (×4): qty 1

## 2015-05-29 MED ORDER — MEASLES, MUMPS & RUBELLA VAC ~~LOC~~ INJ
0.5000 mL | INJECTION | Freq: Once | SUBCUTANEOUS | Status: AC
  Administered 2015-05-31: 0.5 mL via SUBCUTANEOUS
  Filled 2015-05-29: qty 0.5

## 2015-05-29 MED ORDER — BUPIVACAINE 0.5 % ON-Q PUMP DUAL CATH 300 ML
INJECTION | Status: DC | PRN
  Administered 2015-05-29: 300 mL

## 2015-05-29 MED ORDER — DIPHENHYDRAMINE HCL 25 MG PO CAPS: 25.0000 mg | ORAL_CAPSULE | ORAL | Status: DC | PRN

## 2015-05-29 MED ORDER — SIMETHICONE 80 MG PO CHEW
80.0000 mg | CHEWABLE_TABLET | ORAL | Status: DC
  Administered 2015-05-31: 80 mg via ORAL
  Filled 2015-05-29 (×2): qty 1

## 2015-05-29 MED ORDER — TERBUTALINE SULFATE 1 MG/ML IJ SOLN: 0.2500 mg | Freq: Once | INTRAMUSCULAR | Status: DC | PRN

## 2015-05-29 MED ORDER — MENTHOL 3 MG MT LOZG: 1.0000 | LOZENGE | OROMUCOSAL | Status: DC | PRN

## 2015-05-29 MED ORDER — SODIUM CHLORIDE 0.9 % IV SOLN
10000.0000 ug | INTRAVENOUS | Status: DC | PRN
  Administered 2015-05-29: 35 ug/min via INTRAVENOUS

## 2015-05-29 MED ORDER — LANOLIN HYDROUS EX OINT: 1.0000 "application " | TOPICAL_OINTMENT | CUTANEOUS | Status: DC | PRN

## 2015-05-29 MED ORDER — OXYTOCIN 40 UNITS IN LACTATED RINGERS INFUSION - SIMPLE MED
1.0000 m[IU]/min | INTRAVENOUS | Status: DC
  Administered 2015-05-29: 1 m[IU]/min via INTRAVENOUS

## 2015-05-29 MED ORDER — ONDANSETRON HCL 4 MG/2ML IJ SOLN: 4.0000 mg | Freq: Three times a day (TID) | INTRAMUSCULAR | Status: DC | PRN

## 2015-05-29 MED ORDER — ONDANSETRON HCL 4 MG/2ML IJ SOLN
INTRAMUSCULAR | Status: DC | PRN
  Administered 2015-05-29: 4 mg via INTRAVENOUS

## 2015-05-29 MED ORDER — SODIUM CHLORIDE 0.9 % IJ SOLN
INTRAMUSCULAR | Status: AC
  Filled 2015-05-29: qty 50

## 2015-05-29 MED ORDER — SCOPOLAMINE 1 MG/3DAYS TD PT72: 1.0000 | MEDICATED_PATCH | Freq: Once | TRANSDERMAL | Status: DC

## 2015-05-29 MED ORDER — OXYTOCIN 40 UNITS IN LACTATED RINGERS INFUSION - SIMPLE MED: 62.5000 mL/h | INTRAVENOUS | Status: AC

## 2015-05-29 MED ORDER — OXYTOCIN 40 UNITS IN LACTATED RINGERS INFUSION - SIMPLE MED
INTRAVENOUS | Status: DC | PRN
  Administered 2015-05-29: 1000 mL via INTRAVENOUS

## 2015-05-29 MED ORDER — PRENATAL MULTIVITAMIN CH
1.0000 | ORAL_TABLET | Freq: Every day | ORAL | Status: DC
  Administered 2015-05-30: 1 via ORAL
  Filled 2015-05-29: qty 1

## 2015-05-29 MED ORDER — KETOROLAC TROMETHAMINE 30 MG/ML IJ SOLN
30.0000 mg | Freq: Four times a day (QID) | INTRAMUSCULAR | Status: DC | PRN
  Administered 2015-05-29: 30 mg via INTRAVENOUS

## 2015-05-29 MED ORDER — MISOPROSTOL 25 MCG QUARTER TABLET
ORAL_TABLET | ORAL | Status: AC
  Filled 2015-05-29: qty 0.25

## 2015-05-29 MED ORDER — WITCH HAZEL-GLYCERIN EX PADS: 1.0000 "application " | MEDICATED_PAD | CUTANEOUS | Status: DC | PRN

## 2015-05-29 MED ORDER — OXYCODONE-ACETAMINOPHEN 5-325 MG PO TABS
2.0000 | ORAL_TABLET | ORAL | Status: DC | PRN
  Administered 2015-05-31 (×3): 2 via ORAL
  Filled 2015-05-29 (×2): qty 2

## 2015-05-29 MED ORDER — DIBUCAINE 1 % RE OINT: 1.0000 "application " | TOPICAL_OINTMENT | RECTAL | Status: DC | PRN

## 2015-05-29 MED ORDER — DIPHENHYDRAMINE HCL 25 MG PO CAPS: 25.0000 mg | ORAL_CAPSULE | Freq: Four times a day (QID) | ORAL | Status: DC | PRN

## 2015-05-29 MED ORDER — KETOROLAC TROMETHAMINE 30 MG/ML IJ SOLN: 30.0000 mg | Freq: Four times a day (QID) | INTRAMUSCULAR | Status: AC | PRN

## 2015-05-29 MED ORDER — NALOXONE HCL 1 MG/ML IJ SOLN: 1.0000 ug/kg/h | INTRAVENOUS | Status: DC | PRN

## 2015-05-29 MED ORDER — MORPHINE SULFATE (PF) 0.5 MG/ML IJ SOLN
INTRAMUSCULAR | Status: DC | PRN
  Administered 2015-05-29: .1 mg via INTRATHECAL

## 2015-05-29 MED ORDER — OXYCODONE-ACETAMINOPHEN 5-325 MG PO TABS
1.0000 | ORAL_TABLET | ORAL | Status: DC | PRN
  Administered 2015-05-30 (×3): 1 via ORAL
  Filled 2015-05-29 (×5): qty 1

## 2015-05-29 MED ORDER — SENNOSIDES-DOCUSATE SODIUM 8.6-50 MG PO TABS
2.0000 | ORAL_TABLET | ORAL | Status: DC
  Administered 2015-05-31: 2 via ORAL
  Filled 2015-05-29: qty 2

## 2015-05-29 MED ORDER — LIDOCAINE HCL (PF) 1 % IJ SOLN
INTRAMUSCULAR | Status: AC
  Filled 2015-05-29: qty 30

## 2015-05-29 MED ORDER — MISOPROSTOL 200 MCG PO TABS
ORAL_TABLET | ORAL | Status: AC
  Filled 2015-05-29: qty 4

## 2015-05-29 MED ORDER — DIPHENHYDRAMINE HCL 50 MG/ML IJ SOLN: 12.5000 mg | INTRAMUSCULAR | Status: DC | PRN

## 2015-05-29 MED ORDER — NALOXONE HCL 0.4 MG/ML IJ SOLN: 0.4000 mg | INTRAMUSCULAR | Status: DC | PRN

## 2015-05-29 MED ORDER — DIPHENHYDRAMINE HCL 50 MG/ML IJ SOLN
12.5000 mg | INTRAMUSCULAR | Status: DC | PRN
Start: 2015-05-29 — End: 2015-05-29

## 2015-05-29 MED ORDER — BUPIVACAINE ON-Q PAIN PUMP (FOR ORDER SET NO CHG)
INJECTION | Status: DC
  Filled 2015-05-29: qty 1

## 2015-05-29 MED ORDER — IBUPROFEN 600 MG PO TABS: 600.0000 mg | ORAL_TABLET | Freq: Four times a day (QID) | ORAL | Status: DC

## 2015-05-29 MED ORDER — AMMONIA AROMATIC IN INHA
RESPIRATORY_TRACT | Status: AC
  Filled 2015-05-29: qty 10

## 2015-05-29 MED ORDER — TETANUS-DIPHTH-ACELL PERTUSSIS 5-2.5-18.5 LF-MCG/0.5 IM SUSP
0.5000 mL | Freq: Once | INTRAMUSCULAR | Status: AC
  Administered 2015-05-31: 0.5 mL via INTRAMUSCULAR
  Filled 2015-05-29: qty 0.5

## 2015-05-29 MED ORDER — BUPIVACAINE HCL (PF) 0.5 % IJ SOLN
10.0000 mL | Freq: Once | INTRAMUSCULAR | Status: DC
  Filled 2015-05-29: qty 30

## 2015-05-29 MED ORDER — BUPIVACAINE IN DEXTROSE 0.75-8.25 % IT SOLN
INTRATHECAL | Status: DC | PRN
  Administered 2015-05-29: 1.6 mL via INTRATHECAL

## 2015-05-29 MED ORDER — KETOROLAC TROMETHAMINE 30 MG/ML IJ SOLN: 30.0000 mg | Freq: Four times a day (QID) | INTRAMUSCULAR | Status: DC | PRN

## 2015-05-29 MED ORDER — ACETAMINOPHEN 325 MG PO TABS: 650.0000 mg | ORAL_TABLET | ORAL | Status: DC | PRN

## 2015-05-29 SURGICAL SUPPLY — 28 items
CANISTER SUCT 3000ML (MISCELLANEOUS) ×2 IMPLANT
CATH KIT ON-Q SILVERSOAK 5IN (CATHETERS) ×4 IMPLANT
DRSG TELFA 3X8 NADH (GAUZE/BANDAGES/DRESSINGS) ×2 IMPLANT
ELECT CAUTERY BLADE 6.4 (BLADE) ×2 IMPLANT
GAUZE SPONGE 4X4 12PLY STRL (GAUZE/BANDAGES/DRESSINGS) ×2 IMPLANT
GLOVE BIOGEL PI IND STRL 6.5 (GLOVE) ×1 IMPLANT
GLOVE BIOGEL PI INDICATOR 6.5 (GLOVE) ×1
GLOVE SURG SYN 6.5 ES PF (GLOVE) ×2 IMPLANT
GOWN STRL REUS W/ TWL LRG LVL3 (GOWN DISPOSABLE) ×3 IMPLANT
GOWN STRL REUS W/TWL LRG LVL3 (GOWN DISPOSABLE) ×6
LIQUID BAND (GAUZE/BANDAGES/DRESSINGS) ×2 IMPLANT
NS IRRIG 1000ML POUR BTL (IV SOLUTION) ×2 IMPLANT
PACK C SECTION AR (MISCELLANEOUS) ×2 IMPLANT
PAD GROUND ADULT SPLIT (MISCELLANEOUS) ×2 IMPLANT
PAD OB MATERNITY 4.3X12.25 (PERSONAL CARE ITEMS) ×2 IMPLANT
PAD PREP 24X41 OB/GYN DISP (PERSONAL CARE ITEMS) ×2 IMPLANT
STRAP SAFETY BODY (MISCELLANEOUS) ×2 IMPLANT
STRIP CLOSURE SKIN 1/2X4 (GAUZE/BANDAGES/DRESSINGS) ×2 IMPLANT
SUT MNCRL 4-0 (SUTURE) ×1
SUT MNCRL 4-0 27XMFL (SUTURE) ×1
SUT PDS AB 1 TP1 96 (SUTURE) ×2 IMPLANT
SUT VIC AB 0 CT1 36 (SUTURE) ×4 IMPLANT
SUT VIC AB 2-0 CT1 27 (SUTURE) ×2
SUT VIC AB 2-0 CT1 TAPERPNT 27 (SUTURE) ×1 IMPLANT
SUT VIC AB 3-0 SH 27 (SUTURE) ×2
SUT VIC AB 3-0 SH 27X BRD (SUTURE) ×1 IMPLANT
SUTURE MNCRL 4-0 27XMF (SUTURE) ×1 IMPLANT
SWABSTK COMLB BENZOIN TINCTURE (MISCELLANEOUS) ×2 IMPLANT

## 2015-05-29 NOTE — Discharge Instructions (Signed)
Discharge instructions:  ° °Call office if you have any of the following: headache, visual changes, fever >101 F, chills, breast concerns, excessive vaginal bleeding, incision drainage or problems, leg pain or redness, depression or any other concerns.  ° °Activity: Do not lift > 10 lbs for 6 weeks.  °No intercourse or tampons for 6 weeks.  °No driving for 1-2 weeks.  ° °Call your doctor for increased pain or vaginal bleeding, temperature above 101.0, depression, or concerns.  No strenuous activity or heavy lifting for 6 weeks.  No intercourse, tampons, douching, or enemas for 6 weeks.  No tub baths-showers only.  No driving for 2 weeks or while taking pain medications.  Continue prenatal vitamin and iron.  Increase calories and fluids while breastfeeding. ° ° °

## 2015-05-29 NOTE — Discharge Summary (Signed)
Obstetrical Discharge Summary  Date of Admission: 05/28/2015 Date of Discharge: 05/31/2015  Primary OB: [facility]  Gestational Age at Delivery: [redacted]w[redacted]d  Antepartum complications: history of preeclampsia, history of 35-week delivery, BMI 336 history of goiter, Rubella non-immune, history of depression/depression during pregnancy, gestational hypertension @ 33 weeks Reason for Admission: gestational hypertension, breech, for external cephalic version and IOL or C/S --- version not performed due to cephalic on admission Date of Delivery:  05/29/15 Delivered By: CVikki PortsWard Delivery Type: primary cesarean section, low transverse incision with double layer closure Intrapartum complications/course: Ms GWitcheris a [id number] developed gestational hypertension at 368weeks gestation. She was managed as an outpatient and throughout had an unstable fetal lie. At 37 weeks she was set up for external cephalic version, and if successful would be induced, or if unsuccessful would be delivered by cesarean. Upon admission the baby was cephalic. Induction was started with cervadil, then pitocin, then cytotec. She was checked for placement of another cytotec and found to again be breech. External cephalic version versus cesarean delivery were offered and discussed in detail, with risks and benefits of both. Patient elected for cesarean. Anesthesia: epidural Placenta: sponatneous Laceration: n/a Episiotomy: none Newborn Data: Live born female  Birth Weight: 7 lb 2.6 oz (3250 g) APGAR: 8, 9    Discharge Physical Exam:  General: NAD CV: RRR Pulm: CTABL, nl effort ABD: s/nd/nt, fundus firm and below the umbilicus Lochia: moderate Incision: c/d/i DVT Evaluation: LE non-ttp, no evidence of DVT on exam.  HEMOGLOBIN  Date Value Ref Range Status  05/29/2015 11.6* 12.0 - 16.0 g/dL Final   HGB  Date Value Ref Range Status  01/20/2012 11.0* 12.0-16.0 g/dL Final   HCT  Date Value Ref Range  Status  05/29/2015 35.1 35.0 - 47.0 % Final  05/31/2015 28.9      Post partum course: Normal PO course. On POD #2 pt was ambulating without difficulty. Pain well managed with po meds. Mild blood loss anemia treated with Iron. Postpartum Procedures: MMR, Tdap, flu vaccines Disposition: stable, discharge to home.  Rh Immune globulin given:  no Rubella vaccine given: yes Tdap vaccine given in AP or PP setting: post partum Flu vaccine given in AP or PP setting: postpartum  Contraception:  undecided  Prenatal Labs:  Blood type/Rh O positive  Antibody screen negative  Rubella Not immune  RPR Non-reactive  HBsAg negative  HIV negative  GC negative  Chlamydia negative  Genetic screening Declined   1 hour GTT Early (88), 28 week 109  3 hour GTT n/a  GBS negative on 05/19/15          Plan:  NOneita Jollywas discharged to home in good condition. [facility]2 [facility] for incision check   Discharge Medications:   Medication List    ASK your doctor about these medications             ferrous sulfate 325 (65 FE) MG tablet  Take 1 tablet (325 mg total) by mouth daily with breakfast.     ibuprofen 600 MG tablet  Commonly known as:  ADVIL,MOTRIN  Take 1 tablet (600 mg total) by mouth every 6 (six) hours as needed for mild pain.     oxyCODONE-acetaminophen 5-325 MG per tablet  Commonly known as:  PERCOCET/ROXICET  Take 1-2 tablets by mouth every 4 (four) hours as needed (for pain scale 4-7).     PRENATE MINI PO  Take  1 tablet by mouth daily.     PREPARATION H EX  Apply 1 application topically daily as needed.        Signed:  Burlene Arnt, CNM

## 2015-05-29 NOTE — Anesthesia Preprocedure Evaluation (Signed)
Anesthesia Evaluation  Patient identified by MRN, date of birth, ID band Patient awake    Reviewed: Allergy & Precautions, H&P , NPO status , Patient's Chart, lab work & pertinent test results  History of Anesthesia Complications Negative for: history of anesthetic complications  Airway Mallampati: II  TM Distance: >3 FB Neck ROM: full    Dental no notable dental hx. (+) Teeth Intact   Pulmonary neg shortness of breath, former smoker,    Pulmonary exam normal breath sounds clear to auscultation       Cardiovascular Exercise Tolerance: Good hypertension, Normal cardiovascular exam Rhythm:regular Rate:Normal     Neuro/Psych PSYCHIATRIC DISORDERS Depression negative neurological ROS  negative psych ROS   GI/Hepatic Neg liver ROS, GERD  Controlled,  Endo/Other  negative endocrine ROS  Renal/GU negative Renal ROS  negative genitourinary   Musculoskeletal   Abdominal   Peds  Hematology negative hematology ROS (+)   Anesthesia Other Findings 29 y.o. [id number] female at [redacted]w[redacted]d dated by her LMP consistent with an 8 week ultrasound. Her pregnancy has been complicated by obesity with an initial BMI of 33, a history of preeclampsia with severe features requiring induction at 35 weeks, depression, herpes genitalis, gestational hypertension in the current pregnancy..   Reproductive/Obstetrics negative OB ROS                            Anesthesia Physical Anesthesia Plan  ASA: III  Anesthesia Plan: Spinal, Epidural and General   Post-op Pain Management:    Induction:   Airway Management Planned:   Additional Equipment:   Intra-op Plan:   Post-operative Plan:   Informed Consent: I have reviewed the patients History and Physical, chart, labs and discussed the procedure including the risks, benefits and alternatives for the proposed anesthesia with the patient or authorized representative who has  indicated his/her understanding and acceptance.   Dental Advisory Given  Plan Discussed with: Anesthesiologist, CRNA and Surgeon  Anesthesia Plan Comments:         Anesthesia Quick Evaluation  

## 2015-05-29 NOTE — Progress Notes (Addendum)
Patient was due for her cytotec at 12:15.    I performed the exam and while she was 1/long/high, I felt as though I palpated a foot or some extremity.  Ultrasound was performed at the bedside, and fetal lie had changed from cephalic to breech, head in RUQ.    I informed the patient of this finding and offered her external cephalic version versus cesarean delivery.  Due to her gestational hypertension, delivery is warranted.  She has been IOL since yesterday AM when she presented for external cephalic version and was found to be cephalic.  She is s/p cervadil and cytotec overnight.    Her BPs have been WNL and stable, thus time is not urgent for decision.  She will speak with her husband to decide what they would like to do.  I discussed that even if external cephalic version was successful, there may be spontaneous change, also that since the fetus is not engaged, rupture of membranes is unsafe.    They will let me know if they have any questions or of their decision when they have made it.    ----- Ranae Plumber, MD Attending Obstetrician and Gynecologist [facility]    ------------------------------ADDENDUM---------------------------------  Patient has elected for cesarean.  Risks and benefit have been reviewed in detail.  Notify anesthesia and or staff.  To be scheduled later this afternoon.  ----- Ranae Plumber, MD Attending Obstetrician and Gynecologist [facility]

## 2015-05-29 NOTE — Anesthesia Procedure Notes (Signed)
Spinal Patient location during procedure: OR Start time: 05/29/2015 4:40 PM End time: 05/29/2015 4:43 PM Staffing Anesthesiologist: Katy Fitch K Performed by: anesthesiologist  Preanesthetic Checklist Completed: patient identified, site marked, surgical consent, pre-op evaluation, timeout performed, IV checked, risks and benefits discussed and monitors and equipment checked Spinal Block Patient position: sitting Prep: Betadine Patient monitoring: heart rate, continuous pulse ox, blood pressure and cardiac monitor Approach: midline Location: L4-5 Injection technique: single-shot Needle Needle type: Whitacre and Introducer  Needle gauge: 24 G Needle length: 9 cm Assessment Sensory level: T5. Additional Notes Negative paresthesia. Negative blood return. Positive free-flowing CSF. Expiration date of kit checked and confirmed. Patient tolerated procedure well, without complications.

## 2015-05-29 NOTE — Progress Notes (Signed)
Subjective:  Feeling some contraction, not painful.  Objective:   Vitals: Blood pressure 121/64, pulse 91, temperature 97.4 F (36.3 C), temperature source Oral, resp. rate 16, last menstrual period 09/10/2014. General: NAD Abdomen: gravid, non-tender Cervical Exam:  Fingertip per nursing FHT: 110, moderate, +accles, no decels Toco: q3-52min  No results found for this or any previous visit (from the past 24 hour(s)).  Assessment:   29 y.o. [id number] [redacted]w[redacted]d IOL GHTN  Plan:   1) Labor - contracting to frequently for cytotec, will start pitocin plan on foley bulb once 1cm  2) Fetus - category I tracing

## 2015-05-29 NOTE — Op Note (Addendum)
Cesarean Section Procedure Note  Pre-operative Diagnosis: [redacted]w[redacted]d with gestational hypertension and breech presentation/unstable fetal lie  Post-operative Diagnosis: same, with live born female  Attending: Chelsea [name]  Assistant: n/a  Anesthesia: Spinal         Drains: Foley to gravity         Total IV Fluids:  ml  EBL:  Findings: 1. Normal appearing uterus, bilateral ovaries and tubes 2. Live born female, complete breech Birth Weight: 7 lb 2.6 oz (3250 g) APGAR: 8, 9   Specimens: Cord Blood,Cord Gas               Complications:  None apparent      Disposition: VS stable to PACU         Indication for procedure: Kimberly Knapp is a 29yo [id number] who developed gestational hypertension at [redacted] weeks gestation. She was managed as an outpatient and throughout had an unstable fetal lie.  At 37 weeks she was set up for external cephalic version, and if successful would be induced, or if unsuccessful would be delivered by cesarean.  Upon admission the baby was cephalic.  Induction was started with cervadil, then pitocin, then cytotec.  She was checked for placement of another cytotec and found to again be breech.  External cephalic version versus cesarean delivery were offered and discussed in detail, with risks and benefits of both.  Patient elected for cesarean.    Procedure Details   The risks, benefits, complications, treatment options, and expected outcomes were discussed with the patient. Informed consent was obtained. The patient was taken to Operating Room, identified as Kimberly Knapp and the procedure verified as a cesarean delivery.   After administration of anesthesia, the patient was draped and prepped in the usual sterile manner. A surgical time out was performed, with the pediatric team present. Anesthesia was found to be adequate.  A Pfannenstiel incision was made and carried down through the subcutaneous tissue to the fascia. Fascial incision was made and extended  transversely. The fascia was separated from the underlying rectus tissue superiorly and inferiorly. The peritoneum was identified and entered. Peritoneal incision was extended longitudinally.  A low transverse uterine incision was made. Delivered from breech presentation was a Live born female Birth Weight: 7 lb 2.6 oz (3250 g) APGAR: 8, 9. The umbilical cord was doubly clamped and cut, and the baby was handed off to the awaitng pediatrician.  Due to poor initial activity, a sample of cord was obtained and sent for cord gas.  After transfer to the warming bed there was a vigorous cry.  Cord blood was obtained for evaluation. The placenta was removed intact and appeared normal. The uterus was cleared of clots, membranes, and debris. The uterus, tubes and ovaries appeared normal. The uterine incision was closed with running locking sutures of 0 Vicryl, and then a second, imbricating stitch was placed. Hemostasis was observed. The abdominal cavity was evacuated of extraneous fluid. The uterus was returned to the abdominal cavity and again the incision was inspected for hemostasis, which was confirmed.  The paracolic gutters were cleaned.  The peritoneum was closed with 3-0 vicryl and the On-Q catheters were inserted sub-fascially. The fascia was then reapproximated with running suture of vicryl. After a change of gloves, the subcutaneous tissues were irrigated and reapproximated with 3-0 vicryl. The skin was closed with 4-0 Monocryl and covered with surgical glue. Bupivacaine was injected into the On-Q catheters.  Prior to leaving the OR we sang both Happy  Birthday and a Wellsite geologist of [employer].  Instrument, sponge, and needle counts were correct prior the abdominal closure and at the conclusion of the case.   I was present and performed this procedure in its entirety.  The patient was instructed that this closure would not prohibit her from trying to have a vaginal delivery in the  future.  ----- Ranae Plumber, MD Attending Obstetrician and Gynecologist [facility]

## 2015-05-29 NOTE — Transfer of Care (Signed)
Immediate Anesthesia Transfer of Care Note  Patient: Kimberly Knapp  Procedure(s) Performed: Procedure(s): CESAREAN SECTION (N/A)  Patient Location: PACU  Anesthesia Type:Spinal  Level of Consciousness: awake, alert  and oriented  Airway & Oxygen Therapy: Patient Spontanous Breathing  Post-op Assessment: Report given to RN and Post -op Vital signs reviewed and stable  Post vital signs: Reviewed and stable  Last Vitals:  Filed Vitals:   05/29/15 1804  BP: 131/84  Pulse: 79  Temp: 36.6 C  Resp: 18    Complications: No apparent anesthesia complications

## 2015-05-29 NOTE — OR Nursing (Signed)
Placenta placed in refridgerator

## 2015-05-30 LAB — CBC
HEMATOCRIT: 28.9 % — AB (ref 35.0–47.0)
HEMOGLOBIN: 9.5 g/dL — AB (ref 12.0–16.0)
MCH: 30.3 pg (ref 26.0–34.0)
MCHC: 33 g/dL (ref 32.0–36.0)
MCV: 92 fL (ref 80.0–100.0)
Platelets: 205 10*3/uL (ref 150–440)
RBC: 3.15 MIL/uL — AB (ref 3.80–5.20)
RDW: 12.8 % (ref 11.5–14.5)
WBC: 10.8 10*3/uL (ref 3.6–11.0)

## 2015-05-30 NOTE — Anesthesia Postprocedure Evaluation (Signed)
  Anesthesia Post-op Note  Patient: Kimberly Knapp  Procedure(s) Performed: Procedure(s): CESAREAN SECTION (N/A)  Anesthesia type:Spinal, Epidural, General  Patient location: PACU  Post pain: Pain level controlled  Post assessment: Post-op Vital signs reviewed, Patient's Cardiovascular Status Stable, Respiratory Function Stable, Patent Airway and No signs of Nausea or vomiting  Post vital signs: Reviewed and stable  Last Vitals:  Filed Vitals:   05/30/15 1315  BP: 145/70  Pulse: 103  Temp: 36.6 C  Resp: 20    Level of consciousness: awake, alert  and patient cooperative  Complications: No apparent anesthesia complications

## 2015-05-30 NOTE — Progress Notes (Signed)
  Post-operative Day 1  Subjective: no complaints, up ad lib, voiding and tolerating PO    Objective: Blood pressure 134/80, pulse 87, temperature 98.3 F (36.8 C), temperature source Oral, resp. rate 20, height  (1.702 m), weight 250 lb (113.399 kg), last menstrual period 09/10/2014, SpO2 99 %  Physical Exam:  General: alert and cooperative Lochia: appropriate Uterine Fundus: firm Incision: healing well, no significant drainage, On-Q intact DVT Evaluation: No evidence of DVT seen on physical exam. Abdomen: soft, mildly distended with gas   Recent Labs  05/29/15 1514 05/30/15 0544  HGB 11.6* 9.5*  HCT 35.1 28.9*    Assessment POD #1, mild blood loss anemia  Plan: Continue PO care, Advance activity as tolerated, Fe replacement, anemia precautions and Discharge POD 2 or 3  Feeding: breast Contraception: undecided R Non-immune/V Immune TDAP will need postpartum Flu: will need postpartum Blood Type: O+   Marta Antu, CNM 05/30/2015, 12:02 PM

## 2015-05-31 MED ORDER — IBUPROFEN 600 MG PO TABS: 600.0000 mg | ORAL_TABLET | Freq: Four times a day (QID) | ORAL | Status: DC | PRN

## 2015-05-31 MED ORDER — INFLUENZA VAC SPLIT QUAD 0.5 ML IM SUSY: 0.5000 mL | PREFILLED_SYRINGE | INTRAMUSCULAR | Status: DC

## 2015-05-31 MED ORDER — FERROUS SULFATE 325 (65 FE) MG PO TABS: 325.0000 mg | ORAL_TABLET | Freq: Every day | ORAL | Status: DC

## 2015-05-31 MED ORDER — INFLUENZA VAC SPLIT QUAD 0.5 ML IM SUSY
0.5000 mL | PREFILLED_SYRINGE | Freq: Once | INTRAMUSCULAR | Status: AC
  Administered 2015-05-31: 0.5 mL via INTRAMUSCULAR
  Filled 2015-05-31: qty 0.5

## 2015-05-31 MED ORDER — TETANUS-DIPHTH-ACELL PERTUSSIS 5-2.5-18.5 LF-MCG/0.5 IM SUSP: 0.5000 mL | Freq: Once | INTRAMUSCULAR | Status: DC

## 2015-05-31 MED ORDER — OXYCODONE-ACETAMINOPHEN 5-325 MG PO TABS
1.0000 | ORAL_TABLET | ORAL | Status: DC | PRN
Start: 2015-05-31 — End: 2016-11-12

## 2015-05-31 MED ORDER — MEASLES, MUMPS & RUBELLA VAC ~~LOC~~ INJ: 0.5000 mL | INJECTION | Freq: Once | SUBCUTANEOUS | Status: DC

## 2015-05-31 NOTE — Progress Notes (Signed)
Vaccines given.  VIS reviewed. Discharge summary reviewed and all questions answered.

## 2015-06-01 ENCOUNTER — Encounter: Payer: Self-pay | Admitting: Obstetrics & Gynecology

## 2015-06-03 ENCOUNTER — Ambulatory Visit
Admission: RE | Admit: 2015-06-03 | Discharge: 2015-06-03 | Disposition: A | Discharge status: Home / Self Care | Payer: BLUE CROSS/BLUE SHIELD | Source: Ambulatory Visit | Attending: Pediatrics | Admitting: Pediatrics

## 2015-06-03 NOTE — Lactation Note (Signed)
Lactation Consultation Note  Patient Name: Kimberly Knapp Date: 06/03/2015     Maternal Data    Mom had bilateral saline breast implants placed under muscle in 2003. Left breast had been A cup; Right B cup. Now both DD. Healed periareolar incision seen. Mom had poor feeds with 1 baby (now 31 y.o.) and did not pump long. Does says breasts became larger and more sensitive during pregnancy. States baby fed fairly well in hopsital, just very sleepy. Breasts are very firm with lumpy areas dispersed. Mom hand expressed 3 ml form left breast before feed to soften areola. Breast never softened during feeding. She pumped 15 minutes with PNS and 30mm flanges with only 3 ml from right side. Mom lives nearby and plans to go home to apply cabbage leaves and continue with pain meds for engorgement. Right now, not sure if poor supply is from engorgement, sleepy baby, implants or all three. Once engorgement and sleepy baby relsolves, we should know if supply is from breast surgery.   Feeding  Birth wt= 7'2.6"  DC= 6'14" Today at MD office= 6'11".  My office with clean diaper only= 6'8". Kimberly Knapp is jaundiced and sleepy. Bili 13.5, but low intermediate risk for a 5 day old. She woke long enough to try to breastfeed for a while. Mouth, tongue, suck palate WNL. We never heard any swallows even with breast compression and vigorous nursing. After 6 minutes, she stopped trying. No milk intake per pre/post wt check. We then did same thing on right breast with same results. I fed her all of mom's pumped colostrum and then gave her Simillac with iron with slow flow nipple. She took a total of 45ml.  PLAN: Mom to control engorgement with cold packs/cabbage, pain meds, hand expression/pumping every 2-3 hours while Kimberly Knapp gets slow flow bottle of breastmilk/formula of approx 45 ml every 3 hours...adjusting, increasing volume as needed over time. Mom to follow up with LC within the week to retry more breastfeeding and  adjust plan.  I reported to Dr Noralyn Pick about consult and bili level. She agrees with the plan.   [facility] Score/Interventions                      Lactation Tools Discussed/Used     Consult Status      Sunday Corn 06/03/2015, 3:39 PM

## 2016-11-12 ENCOUNTER — Ambulatory Visit (HOSPITAL_COMMUNITY)
Admission: EM | Admit: 2016-11-12 | Discharge: 2016-11-12 | Disposition: A | Discharge status: Home / Self Care | Payer: BLUE CROSS/BLUE SHIELD | Attending: Internal Medicine | Admitting: Internal Medicine

## 2016-11-12 ENCOUNTER — Encounter (HOSPITAL_COMMUNITY): Payer: Self-pay | Admitting: Family Medicine

## 2016-11-12 DIAGNOSIS — M940 Chondrocostal junction syndrome [Tietze]: Secondary | ICD-10-CM

## 2016-11-12 DIAGNOSIS — R0789 Other chest pain: Secondary | ICD-10-CM | POA: Diagnosis not present

## 2016-11-12 NOTE — Discharge Instructions (Signed)
The pain in your chest is coming from inflammation of the cartilage adjacent to the breast bone.. EKG is normal. You have very few risk factors for heart disease at this time. No diagnoses of heart disease or lung problems. This allows for low probability of a cardiovascular or cardiopulmonary problem. For discomfort place ice over the area of soreness. He may take ibuprofen or Aleve for inflammation and pain. Remember this may last a few weeks as we discussed. If you develop chest pain of any tight and you have associated symptoms such as shortness of breath, sweating, vomiting, weakness, passing out or more severe pain call 911 or go to the emergency department.

## 2016-11-12 NOTE — ED Provider Notes (Signed)
CSN: [id number]     Arrival date & time 11/12/16  1751 History   First MD Initiated Contact with Patient 11/12/16 1910     Chief Complaint  Patient presents with  . Chest Pain   (Consider location/radiation/quality/duration/timing/severity/associated sxs/prior Treatment) 31 year old female complaining of chest tightness along the lower third of the sternum. It does not radiate. It started approximately 2-3 days ago it tends to come and go. It is not associated with other symptoms such as diaphoresis, nausea, vomiting, shortness of breath, dizziness, syncope, abdominal pain or indigestion/GI symptoms. She has no history of cardiopulmonary disease. No history of hypertension except when she was pregnant. The  chest discomfort is not affected by exertion or rest. It is not worse or better by that one. Nothing seems to make it worse. Nothing seems to make it better. She does have a history of anxiety, she took exam 0.25 mg thinking it might help and there was no change. She has a history of ADD and takes Adderall.       Past Medical History:  Diagnosis Date  . Depression   . Goiter   . Herpes genitalis   . Obesity (BMI 30.0-34.9)   . Pregnancy induced hypertension    Past Surgical History:  Procedure Laterality Date  . BREAST ENHANCEMENT SURGERY Bilateral   . CESAREAN SECTION N/A 05/29/2015   Procedure: CESAREAN SECTION;  Surgeon: [name], MD;  Location: [facility] ORS;  Service: Obstetrics;  Laterality: N/A;  . DILATION AND CURETTAGE OF UTERUS    . THYROID SURGERY     thyroid biopsy  . WISDOM TOOTH EXTRACTION     Family History  Problem Relation Age of Onset  . Diabetes type II    . Lung cancer    . Heart disease    . Lung cancer     Social History  Substance Use Topics  . Smoking status: Former Games developer  . Smokeless tobacco: Never Used  . Alcohol use No   OB History    Gravida Para Term Preterm AB Living   5 2 1 1 3  0   SAB TAB Ectopic Multiple Live Births   3     0        Review of Systems  Constitutional: Negative.  Negative for fever.  HENT: Negative.   Respiratory: Negative.  Negative for cough and shortness of breath.   Cardiovascular: Positive for chest pain. Negative for palpitations and leg swelling.  Gastrointestinal: Negative.   Genitourinary: Negative.   Musculoskeletal: Negative.   Neurological: Negative.   Psychiatric/Behavioral: The patient is nervous/anxious.   All other systems reviewed and are negative.   Allergies  Iodine and Shrimp [shellfish allergy]  Home Medications   Prior to Admission medications   Medication Sig Start Date End Date Taking? Authorizing Provider  ALPRAZolam Prudy Feeler) 0.25 MG tablet Take 0.25 mg by mouth at bedtime as needed for anxiety.   Yes Historical Provider, MD  amphetamine-dextroamphetamine (ADDERALL) 10 MG tablet Take 10 mg by mouth daily with breakfast.   Yes Historical Provider, MD  Prenat w/o A-FeCbn-Meth-FA-DHA (PRENATE MINI PO) Take 1 tablet by mouth daily.    Historical Provider, MD   Meds Ordered and Administered this Visit  Medications - No data to display  BP 127/83   Pulse 82   Temp 98 F (36.7 C)   Resp 18   LMP 11/05/2016   SpO2 100%  No data found.   Physical Exam  Constitutional: She is oriented to person, place,  and time. She appears well-developed and well-nourished. No distress.  HENT:  Head: Normocephalic and atraumatic.  Eyes: EOM are normal.  Neck: Normal range of motion. Neck supple.  Cardiovascular: Normal rate, regular rhythm, normal heart sounds and intact distal pulses.  Exam reveals no gallop and no friction rub.   No murmur heard. Pulmonary/Chest: Effort normal and breath sounds normal. No respiratory distress. She has no wheezes. She has no rales. She exhibits tenderness.  Unequivocal, reproducible tenderness to the left lower sternal border and along the left medial costal margin. Having the patient take a very deep breath also increases pain along the costal  margin. Manual pressure against the sternal border reproduces the same pain and tenderness for which she has been experiencing the past 2-3 days.  This exam was performed with the patient's top and bra on, no exposure.   Lymphadenopathy:    She has no cervical adenopathy.  Neurological: She is alert and oriented to person, place, and time.  Skin: Skin is warm and dry.  Psychiatric: She has a normal mood and affect. Her behavior is normal.  Nursing note and vitals reviewed.   Urgent Care Course    ED ECG REPORT   Date: 11/12/2016  Rate: 66  Rhythm: normal sinus rhythm  QRS Axis: normal  Intervals: normal  ST/T Wave abnormalities: normal  Conduction Disutrbances:none  Narrative Interpretation:   Old EKG Reviewed: none available  I have personally reviewed the EKG tracing and agree with the computerized printout as noted.  Procedures (including critical care time)  Labs Review Labs Reviewed - No data to display  Imaging Review No results found.   Visual Acuity Review  Right Eye Distance:   Left Eye Distance:   Bilateral Distance:    Right Eye Near:   Left Eye Near:    Bilateral Near:         MDM   1. Other chest pain   2. Costochondritis, acute    The pain in your chest is coming from inflammation of the cartilage adjacent to the breast bone.. EKG is normal. You have very few risk factors for heart disease at this time. No diagnoses of heart disease or lung problems. This allows for low probability of a cardiovascular or cardiopulmonary problem. For discomfort place ice over the area of soreness. He may take ibuprofen or Aleve for inflammation and pain. Remember this may last a few weeks as we discussed. If you develop chest pain of any tight and you have associated symptoms such as shortness of breath, sweating, vomiting, weakness, passing out or more severe pain call 911 or go to the emergency department.     Hayden Rasmussenavid Foy Vanduyne, NP 11/12/16 1958

## 2016-11-12 NOTE — ED Triage Notes (Signed)
Pt here for chest pain that has been intermittent over the past few days. stst hat she thought it was her anxiety and she took xanax without relief. sts that it is heaviness and no radiation. sts that she has been under a lot of stress.

## 2017-02-08 ENCOUNTER — Ambulatory Visit (INDEPENDENT_AMBULATORY_CARE_PROVIDER_SITE_OTHER): Payer: BLUE CROSS/BLUE SHIELD | Admitting: Advanced Practice Midwife

## 2017-02-08 ENCOUNTER — Encounter: Payer: Self-pay | Admitting: Advanced Practice Midwife

## 2017-02-08 VITALS — BP 112/80 | HR 84 | Ht 67.0 in | Wt 227.0 lb

## 2017-02-08 DIAGNOSIS — Z124 Encounter for screening for malignant neoplasm of cervix: Secondary | ICD-10-CM

## 2017-02-08 DIAGNOSIS — Z01419 Encounter for gynecological examination (general) (routine) without abnormal findings: Secondary | ICD-10-CM

## 2017-02-08 NOTE — Progress Notes (Signed)
Patient ID: Kimberly Knapp, female   DOB: Apr 21, 1986, 31 y.o.   MRN: [id number]     Gynecology Annual Exam  PCP: Patient, No Pcp Per  Chief Complaint:  Chief Complaint  Patient presents with  . Gynecologic Exam    History of Present Illness: Patient is a 31 y.o. [id number] presents for annual exam. The patient has no complaints today.   LMP: Patient's last menstrual period was 01/09/2017. Menarche:not applicable Average Interval: regular, 28 days Duration of flow: 5 days Heavy Menses: no Clots: no Intermenstrual Bleeding: no Postcoital Bleeding: no Dysmenorrhea: no  The patient is sexually active. She currently uses None for contraception. She denies dyspareunia.  The patient does not perform self breast exams.  There is no notable family history of breast or ovarian cancer in her family.  The patient wears seatbelts: yes.   The patient has regular exercise: yes.    The patient denies current symptoms of depression.    Review of Systems: Review of Systems  Constitutional: Negative.   HENT: Negative.   Eyes: Negative.   Respiratory: Negative.   Cardiovascular: Negative.   Gastrointestinal: Negative.   Genitourinary: Negative.   Musculoskeletal: Negative.   Skin: Negative.   Neurological: Negative.   Endo/Heme/Allergies: Negative.   Psychiatric/Behavioral: Negative.     Past Medical History:  Past Medical History:  Diagnosis Date  . Depression   . Goiter   . Herpes genitalis   . Obesity (BMI 30.0-34.9)   . Pregnancy induced hypertension     Past Surgical History:  Past Surgical History:  Procedure Laterality Date  . BREAST ENHANCEMENT SURGERY Bilateral   . CESAREAN SECTION N/A 05/29/2015   Procedure: CESAREAN SECTION;  Surgeon: Elenora Fenderhelsea C [name], MD;  Location: [facility] ORS;  Service: Obstetrics;  Laterality: N/A;  . DILATION AND CURETTAGE OF UTERUS    . THYROID SURGERY     thyroid biopsy  . WISDOM TOOTH EXTRACTION      Gynecologic History:  Patient's  last menstrual period was 01/09/2017. Contraception: none Last Pap: Results were: no abnormalities   Obstetric History: [id number]  Family History:  Family History  Problem Relation Age of Onset  . Diabetes type II Unknown   . Lung cancer Unknown   . Heart disease Unknown   . Lung cancer Unknown     Social History:  Social History   Social History  . Marital status: Single    Spouse name: N/A  . Number of children: N/A  . Years of education: N/A   Occupational History  . Not on file.   Social History Main Topics  . Smoking status: Former Games developermoker  . Smokeless tobacco: Never Used  . Alcohol use No  . Drug use: No  . Sexual activity: Yes    Birth control/ protection: None   Other Topics Concern  . Not on file   Social History Narrative  . No narrative on file    Allergies:  Allergies  Allergen Reactions  . Iodine Swelling  . Shrimp [Shellfish Allergy] Swelling    Medications: Prior to Admission medications   Medication Sig Start Date End Date Taking? Authorizing Provider  amphetamine-dextroamphetamine (ADDERALL) 10 MG tablet Take 10 mg by mouth daily with breakfast.   Yes [provider]    Physical Exam Vitals: Blood pressure 112/80, pulse 84, height 5\' 7"  (1.702 m), weight 227 lb (103 kg), last menstrual period 01/09/2017, not currently breastfeeding.  General: NAD HEENT: normocephalic, anicteric Thyroid: no enlargement, no palpable nodules Pulmonary:  No increased work of breathing, CTAB Cardiovascular: RRR, distal pulses 2+ Breast: Breast symmetrical, no tenderness, no palpable nodules or masses, no skin or nipple retraction present, no nipple discharge.  No axillary or supraclavicular lymphadenopathy. Abdomen: NABS, soft, non-tender, non-distended.  Umbilicus without lesions.  No hepatomegaly, splenomegaly or masses palpable. No evidence of hernia  Genitourinary:  External: Normal external female genitalia.  Normal urethral meatus, normal    Bartholin's and Skene's glands.    Vagina: Normal vaginal mucosa, no evidence of prolapse.    Cervix: Grossly normal in appearance, no bleeding, no CMT  Uterus: Non-enlarged, mobile, normal contour.    Adnexa: ovaries non-enlarged, no adnexal masses  Rectal: deferred  Lymphatic: no evidence of inguinal lymphadenopathy Extremities: no edema, erythema, or tenderness Neurologic: Grossly intact Psychiatric: mood appropriate, affect full   Assessment: 31 y.o. [id number] Well Woman exam with PAP  Plan: Problem List Items Addressed This Visit    None    Visit Diagnoses    Well woman exam with routine gynecological exam    -  Primary   Relevant Orders   IGP, Aptima HPV, rfx 16/18,45   Cervical cancer screening       Relevant Orders   IGP, Aptima HPV, rfx 16/18,45      1) Continue healthy lifestyle diet and exercise  2) ASCCP guidelines and rational discussed.  Patient opts for yearly screening interval  3) Contraception - Patient plans pregnancy  4) Routine healthcare maintenance including cholesterol, diabetes screening discussed managed by PCP  5) Follow up 1 year for routine annual exam   Tresea Mall, CNM

## 2017-02-10 LAB — IGP, APTIMA HPV, RFX 16/18,45
HPV Aptima: NEGATIVE
PAP SMEAR COMMENT: 0

## 2017-09-26 ENCOUNTER — Encounter: Payer: Self-pay | Admitting: Family Medicine

## 2017-09-26 ENCOUNTER — Ambulatory Visit: Payer: BLUE CROSS/BLUE SHIELD | Admitting: Family Medicine

## 2017-09-26 VITALS — BP 118/74 | HR 89 | Temp 98.1°F | Ht 67.0 in | Wt 218.8 lb

## 2017-09-26 DIAGNOSIS — Z7689 Persons encountering health services in other specified circumstances: Secondary | ICD-10-CM | POA: Diagnosis not present

## 2017-09-26 DIAGNOSIS — F900 Attention-deficit hyperactivity disorder, predominantly inattentive type: Secondary | ICD-10-CM | POA: Insufficient documentation

## 2017-09-26 DIAGNOSIS — R5383 Other fatigue: Secondary | ICD-10-CM

## 2017-09-26 DIAGNOSIS — J111 Influenza due to unidentified influenza virus with other respiratory manifestations: Secondary | ICD-10-CM

## 2017-09-26 DIAGNOSIS — F988 Other specified behavioral and emotional disorders with onset usually occurring in childhood and adolescence: Secondary | ICD-10-CM

## 2017-09-26 LAB — VERITOR FLU A/B WAIVED
INFLUENZA B: NEGATIVE
Influenza A: NEGATIVE

## 2017-09-26 NOTE — Progress Notes (Signed)
BP 118/74 (BP Location: Left Arm, Patient Position: Sitting, Cuff Size: Normal)   Pulse 89   Temp 98.1 F (36.7 C) (Oral)   Ht 5\' 7"  (1.702 m)   Wt 218 lb 12.8 oz (99.2 kg)   SpO2 100%   BMI 34.27 kg/m    Subjective:    Patient ID: Kimberly Knapp, female    DOB: 1986-02-21, 32 y.o.   MRN: [id number]  HPI: Kimberly Knapp is a 33 y.o. female  Chief Complaint  Patient presents with  . Establish Care  . Cough    Since Sunday. Extreme fatigue.   . Nasal Congestion  . Fatigue  . Chills  . Fever   Pt here today to establish care. Has several concerns she would like to discuss.   Started Sunday night with sudden onset headache, sore throat, body aches, chills, fevers, fatigue, ear pressure, congestion. Fever was 101 on tylenol the first day. Both children have been sick off and on for months. Denies CP, SOB, N/V/D. Taking OTC cold medications with minimal relief.   Fatigue and memory issues worsening the past 2 months but started about a year ago originally. States her brain feels "foggy" most of the time. Does have a hx of depression, does not feel like she's struggling with her moods currently. Last pregnancy was about 2 years ago. No hx of post-partum depression known.   Taking adderall for ADD, wanting to come off of that. This is managed by her GYN Dr. Skeet Simmer. States it helps some but thinks there should be ways to get better relief. Wanting Psychiatry referral for this.   Past Medical History:  Diagnosis Date  . Depression   . Goiter   . Herpes genitalis   . Obesity (BMI 30.0-34.9)   . Pregnancy induced hypertension    Social History   Socioeconomic History  . Marital status: Single    Spouse name: Not on file  . Number of children: Not on file  . Years of education: Not on file  . Highest education level: Not on file  Social Needs  . Financial resource strain: Not on file  . Food insecurity - worry: Not on file  . Food insecurity - inability: Not on file    . Transportation needs - medical: Not on file  . Transportation needs - non-medical: Not on file  Occupational History  . Not on file  Tobacco Use  . Smoking status: Former Games developer  . Smokeless tobacco: Never Used  Substance and Sexual Activity  . Alcohol use: No  . Drug use: No  . Sexual activity: Yes    Birth control/protection: None  Other Topics Concern  . Not on file  Social History Narrative  . Not on file   Relevant past medical, surgical, family and social history reviewed and updated as indicated. Interim medical history since our last visit reviewed. Allergies and medications reviewed and updated.  Review of Systems  Constitutional: Positive for chills, fatigue and fever.  HENT: Positive for congestion, ear pain and sore throat.   Respiratory: Negative.   Cardiovascular: Negative.   Gastrointestinal: Negative.   Genitourinary: Negative.   Musculoskeletal: Positive for myalgias.  Neurological: Positive for headaches.  Psychiatric/Behavioral: Positive for confusion and decreased concentration.   Per HPI unless specifically indicated above     Objective:    BP 118/74 (BP Location: Left Arm, Patient Position: Sitting, Cuff Size: Normal)   Pulse 89   Temp 98.1 F (36.7 C) (Oral)  Ht 5\' 7"  (1.702 m)   Wt 218 lb 12.8 oz (99.2 kg)   SpO2 100%   BMI 34.27 kg/m   Wt Readings from Last 3 Encounters:  09/26/17 218 lb 12.8 oz (99.2 kg)  02/08/17 227 lb (103 kg)  05/29/15 250 lb (113.4 kg)    Physical Exam  Constitutional: She is oriented to person, place, and time. She appears well-developed and well-nourished. No distress.  HENT:  Head: Atraumatic.  Significant erythema of nasal mucosa and oropharynx B/l middle ear effusion  Eyes: Conjunctivae are normal. No scleral icterus.  Neck: Normal range of motion. Neck supple. No thyromegaly present.  Cardiovascular: Normal rate and normal heart sounds.  Pulmonary/Chest: Effort normal and breath sounds normal. No  respiratory distress.  Musculoskeletal: Normal range of motion.  Lymphadenopathy:    She has no cervical adenopathy.  Neurological: She is alert and oriented to person, place, and time.  Skin: Skin is warm and dry.  Psychiatric: She has a normal mood and affect. Her behavior is normal.  Nursing note and vitals reviewed.  Results for orders placed or performed in visit on 09/26/17  Influenza A & B (STAT)  Result Value Ref Range   Influenza A Negative Negative   Influenza B Negative Negative  CBC with Differential/Platelet  Result Value Ref Range   WBC 13.1 (H) 3.4 - 10.8 x10E3/uL   RBC 4.29 3.77 - 5.28 x10E6/uL   Hemoglobin 13.7 11.1 - 15.9 g/dL   Hematocrit 16.139.8 09.634.0 - 46.6 %   MCV 93 79 - 97 fL   MCH 31.9 26.6 - 33.0 pg   MCHC 34.4 31.5 - 35.7 g/dL   RDW 04.513.0 40.912.3 - 81.115.4 %   Platelets 291 150 - 379 x10E3/uL   Neutrophils 75 Not Estab. %   Lymphs 16 Not Estab. %   Monocytes 8 Not Estab. %   Eos 1 Not Estab. %   Basos 0 Not Estab. %   Neutrophils Absolute 9.6 (H) 1.4 - 7.0 x10E3/uL   Lymphocytes Absolute 2.1 0.7 - 3.1 x10E3/uL   Monocytes Absolute 1.1 (H) 0.1 - 0.9 x10E3/uL   EOS (ABSOLUTE) 0.2 0.0 - 0.4 x10E3/uL   Basophils Absolute 0.0 0.0 - 0.2 x10E3/uL   Immature Granulocytes 0 Not Estab. %   Immature Grans (Abs) 0.0 0.0 - 0.1 x10E3/uL  TSH  Result Value Ref Range   TSH 0.423 (L) 0.450 - 4.500 uIU/mL      Assessment & Plan:   Problem List Items Addressed This Visit      Other   ADD (attention deficit disorder)    Mild relief with adderall, pt requesting Psychiatric eval for better options for management. Discussed with patient some options she had but pt wanting to see Psychiatry at this time. Continue adderall in meantime      Relevant Orders   Ambulatory referral to Psychiatry    Other Visit Diagnoses    Influenza    -  Primary   Rapid flu neg but sxs consistent. Pt not interested in tamiflu, will continue symptomatic tx and follow up if worsening or no  improvement   Relevant Orders   Influenza A & B (STAT) (Completed)   Encounter to establish care       Fatigue, unspecified type       Will r/o organic causes such as hyopthyroid and anemia, discussed depression may be contributing. Await results, has Psychiatry consult coming   Relevant Orders   CBC with Differential/Platelet (Completed)   TSH (  Completed)       Follow up plan: Return for CPE.

## 2017-09-27 ENCOUNTER — Telehealth: Payer: Self-pay | Admitting: Family Medicine

## 2017-09-27 LAB — CBC WITH DIFFERENTIAL/PLATELET
Basophils Absolute: 0 10*3/uL (ref 0.0–0.2)
Basos: 0 %
EOS (ABSOLUTE): 0.2 10*3/uL (ref 0.0–0.4)
EOS: 1 %
HEMATOCRIT: 39.8 % (ref 34.0–46.6)
HEMOGLOBIN: 13.7 g/dL (ref 11.1–15.9)
Immature Grans (Abs): 0 10*3/uL (ref 0.0–0.1)
Immature Granulocytes: 0 %
LYMPHS ABS: 2.1 10*3/uL (ref 0.7–3.1)
Lymphs: 16 %
MCH: 31.9 pg (ref 26.6–33.0)
MCHC: 34.4 g/dL (ref 31.5–35.7)
MCV: 93 fL (ref 79–97)
MONOCYTES: 8 %
MONOS ABS: 1.1 10*3/uL — AB (ref 0.1–0.9)
NEUTROS ABS: 9.6 10*3/uL — AB (ref 1.4–7.0)
Neutrophils: 75 %
Platelets: 291 10*3/uL (ref 150–379)
RBC: 4.29 x10E6/uL (ref 3.77–5.28)
RDW: 13 % (ref 12.3–15.4)
WBC: 13.1 10*3/uL — ABNORMAL HIGH (ref 3.4–10.8)

## 2017-09-27 LAB — TSH: TSH: 0.423 u[IU]/mL — ABNORMAL LOW (ref 0.450–4.500)

## 2017-09-27 NOTE — Telephone Encounter (Signed)
Called pt about lab results. No anemia and thyroid was a big hyperactive but very close to normal. Will plan to recheck that level in 6 or so months. Discussed that fatigue is likely mood related given her hx of depression, pt opting to wait to get into Psychiatry to discuss management options further

## 2017-09-29 NOTE — Assessment & Plan Note (Signed)
Mild relief with adderall, pt requesting Psychiatric eval for better options for management. Discussed with patient some options she had but pt wanting to see Psychiatry at this time. Continue adderall in meantime

## 2017-09-29 NOTE — Patient Instructions (Signed)
Follow up for CPE 

## 2017-10-13 ENCOUNTER — Encounter: Payer: Self-pay | Admitting: Psychiatry

## 2017-10-13 ENCOUNTER — Ambulatory Visit: Payer: BLUE CROSS/BLUE SHIELD | Admitting: Psychiatry

## 2017-10-13 ENCOUNTER — Other Ambulatory Visit: Payer: Self-pay

## 2017-10-13 VITALS — BP 127/82 | HR 72 | Temp 98.5°F | Wt 218.0 lb

## 2017-10-13 DIAGNOSIS — F411 Generalized anxiety disorder: Secondary | ICD-10-CM | POA: Diagnosis not present

## 2017-10-13 DIAGNOSIS — F900 Attention-deficit hyperactivity disorder, predominantly inattentive type: Secondary | ICD-10-CM | POA: Diagnosis not present

## 2017-10-13 DIAGNOSIS — F330 Major depressive disorder, recurrent, mild: Secondary | ICD-10-CM | POA: Diagnosis not present

## 2017-10-13 MED ORDER — TRAZODONE HCL 50 MG PO TABS: 25.0000 mg | ORAL_TABLET | Freq: Every evening | ORAL | 1 refills | Status: DC | PRN

## 2017-10-13 MED ORDER — AMPHETAMINE-DEXTROAMPHETAMINE 5 MG PO TABS: 5.0000 mg | ORAL_TABLET | Freq: Every day | ORAL | 0 refills | Status: DC | PRN

## 2017-10-13 MED ORDER — ESCITALOPRAM OXALATE 5 MG PO TABS: 5.0000 mg | ORAL_TABLET | Freq: Every day | ORAL | 1 refills | Status: DC

## 2017-10-13 MED ORDER — AMPHETAMINE-DEXTROAMPHET ER 20 MG PO CP24: 20.0000 mg | ORAL_CAPSULE | Freq: Every day | ORAL | 0 refills | Status: DC

## 2017-10-13 NOTE — Patient Instructions (Signed)
Amphetamine; Dextroamphetamine tablets What is this medicine? AMPHETAMINE; DEXTROAMPHETAMINE(am FET a meen; dex troe am FET a meen) is used to treat attention-deficit hyperactivity disorder (ADHD). It may also be used for narcolepsy. Federal law prohibits giving this medicine to any person other than the person for whom it was prescribed. Do not share this medicine with anyone else. This medicine may be used for other purposes; ask your health care provider or pharmacist if you have questions. COMMON BRAND NAME(S): Adderall What should I tell my health care provider before I take this medicine? They need to know if you have any of these conditions: -anxiety or panic attacks -circulation problems in fingers and toes -glaucoma -hardening or blockages of the arteries or heart blood vessels -heart disease or a heart defect -high blood pressure -history of a drug or alcohol abuse problem -history of stroke -kidney disease -liver disease -mental illness -seizures -suicidal thoughts, plans, or attempt; a previous suicide attempt by you or a family member -thyroid disease -Tourette's syndrome -an unusual or allergic reaction to dextroamphetamine, other amphetamines, other medicines, foods, dyes, or preservatives -pregnant or trying to get pregnant -breast-feeding How should I use this medicine? Take this medicine by mouth with a glass of water. Follow the directions on the prescription label. Take your doses at regular intervals. Do not take your medicine more often than directed. Do not suddenly stop your medicine. You must gradually reduce the dose or you may feel withdrawal effects. Ask your doctor or health care professional for advice. Talk to your pediatrician regarding the use of this medicine in children. Special care may be needed. While this drug may be prescribed for children as young as 3 years for selected conditions, precautions do apply. Overdosage: If you think you have taken too  much of this medicine contact a poison control center or emergency room at once. NOTE: This medicine is only for you. Do not share this medicine with others. What if I miss a dose? If you miss a dose, take it as soon as you can. If it is almost time for your next dose, take only that dose. Do not take double or extra doses. What may interact with this medicine? Do not take this medicine with any of the following medications: -MAOIS like Carbex, Eldepryl, Marplan, Nardil, and Parnate -other stimulant medicines for attention disorders, weight loss, or to stay awake This medicine may also interact with the following medications: -acetazolamide -ammonium chloride -antacids -ascorbic acid -atomoxetine -caffeine -certain medicines for blood pressure -certain medicines for depression, anxiety, or psychotic disturbances -certain medicines for seizures like carbamazepine, phenobarbital, phenytoin -certain medicines for stomach problems like cimetidine, famotidine, omeprazole, lansoprazole -cold or allergy medicines -glutamic acid -lithium -meperidine -methenamine; sodium acid phosphate -narcotic medicines for pain -norepinephrine -phenothiazines like chlorpromazine, mesoridazine, prochlorperazine, thioridazine -sodium acid phosphate -sodium bicarbonate This list may not describe all possible interactions. Give your health care provider a list of all the medicines, herbs, non-prescription drugs, or dietary supplements you use. Also tell them if you smoke, drink alcohol, or use illegal drugs. Some items may interact with your medicine. What should I watch for while using this medicine? Visit your doctor or health care professional for regular checks on your progress. This prescription requires that you follow special procedures with your doctor and pharmacy. You will need to have a new written prescription from your doctor every time you need a refill. This medicine may affect your  concentration, or hide signs of tiredness. Until you know how this   medicine affects you, do not drive, ride a bicycle, use machinery, or do anything that needs mental alertness. Tell your doctor or health care professional if this medicine loses its effects, or if you feel you need to take more than the prescribed amount. Do not change the dosage without talking to your doctor or health care professional. Decreased appetite is a common side effect when starting this medicine. Eating small, frequent meals or snacks can help. Talk to your doctor if you continue to have poor eating habits. Height and weight growth of a child taking this medicine will be monitored closely. Do not take this medicine close to bedtime. It may prevent you from sleeping. If you are going to need surgery, a MRI, CT scan, or other procedure, tell your doctor that you are taking this medicine. You may need to stop taking this medicine before the procedure. Tell your doctor or healthcare professional right away if you notice unexplained wounds on your fingers and toes while taking this medicine. You should also tell your healthcare provider if you experience numbness or pain, changes in the skin color, or sensitivity to temperature in your fingers or toes. What side effects may I notice from receiving this medicine? Side effects that you should report to your doctor or health care professional as soon as possible: -allergic reactions like skin rash, itching or hives, swelling of the face, lips, or tongue -changes in vision -chest pain or chest tightness -confusion, trouble speaking or understanding -fast, irregular heartbeat -fingers or toes feel numb, cool, painful -hallucination, loss of contact with reality -high blood pressure -males: prolonged or painful erection -seizures -severe headaches -shortness of breath -suicidal thoughts or other mood changes -trouble walking, dizziness, loss of balance or  coordination -uncontrollable head, mouth, neck, arm, or leg movements Side effects that usually do not require medical attention (report to your doctor or health care professional if they continue or are bothersome): -anxious -headache -loss of appetite -nausea, vomiting -trouble sleeping -weight loss This list may not describe all possible side effects. Call your doctor for medical advice about side effects. You may report side effects to FDA at 1-800-FDA-1088. Where should I keep my medicine? Keep out of the reach of children. This medicine can be abused. Keep your medicine in a safe place to protect it from theft. Do not share this medicine with anyone. Selling or giving away this medicine is dangerous and against the law. Store at room temperature between 15 and 30 degrees C (59 and 86 degrees F). Keep container tightly closed. Throw away any unused medicine after the expiration date. Dispose of properly. This medicine may cause accidental overdose and death if it is taken by other adults, children, or pets. Mix any unused medicine with a substance like cat litter or coffee grounds. Then throw the medicine away in a sealed container like a sealed bag or a coffee can with a lid. Do not use the medicine after the expiration date. NOTE: This sheet is a summary. It may not cover all possible information. If you have questions about this medicine, talk to your doctor, pharmacist, or health care provider.  2018 [employer] Standard (2014-07-02 18:44:41) Escitalopram tablets What is this medicine? ESCITALOPRAM (es sye TAL oh pram) is used to treat depression and certain types of anxiety. This medicine may be used for other purposes; ask your health care provider or pharmacist if you have questions. COMMON BRAND NAME(S): Lexapro What should I tell my health care provider before I take  this medicine? They need to know if you have any of these conditions: -bipolar disorder or a family history of  bipolar disorder -diabetes -glaucoma -heart disease -kidney or liver disease -receiving electroconvulsive therapy -seizures (convulsions) -suicidal thoughts, plans, or attempt by you or a family member -an unusual or allergic reaction to escitalopram, the related drug citalopram, other medicines, foods, dyes, or preservatives -pregnant or trying to become pregnant -breast-feeding How should I use this medicine? Take this medicine by mouth with a glass of water. Follow the directions on the prescription label. You can take it with or without food. If it upsets your stomach, take it with food. Take your medicine at regular intervals. Do not take it more often than directed. Do not stop taking this medicine suddenly except upon the advice of your doctor. Stopping this medicine too quickly may cause serious side effects or your condition may worsen. A special MedGuide will be given to you by the pharmacist with each prescription and refill. Be sure to read this information carefully each time. Talk to your pediatrician regarding the use of this medicine in children. Special care may be needed. Overdosage: If you think you have taken too much of this medicine contact a poison control center or emergency room at once. NOTE: This medicine is only for you. Do not share this medicine with others. What if I miss a dose? If you miss a dose, take it as soon as you can. If it is almost time for your next dose, take only that dose. Do not take double or extra doses. What may interact with this medicine? Do not take this medicine with any of the following medications: -certain medicines for fungal infections like fluconazole, itraconazole, ketoconazole, posaconazole, voriconazole -cisapride -citalopram -dofetilide -dronedarone -linezolid -MAOIs like Carbex, Eldepryl, Marplan, Nardil, and Parnate -methylene blue (injected into a vein) -pimozide -thioridazine -ziprasidone This medicine may also  interact with the following medications: -alcohol -amphetamines -aspirin and aspirin-like medicines -carbamazepine -certain medicines for depression, anxiety, or psychotic disturbances -certain medicines for migraine headache like almotriptan, eletriptan, frovatriptan, naratriptan, rizatriptan, sumatriptan, zolmitriptan -certain medicines for sleep -certain medicines that treat or prevent blood clots like warfarin, enoxaparin, dalteparin -cimetidine -diuretics -fentanyl -furazolidone -isoniazid -lithium -metoprolol -NSAIDs, medicines for pain and inflammation, like ibuprofen or naproxen -other medicines that prolong the QT interval (cause an abnormal heart rhythm) -procarbazine -rasagiline -supplements like St. John's wort, kava kava, valerian -tramadol -tryptophan This list may not describe all possible interactions. Give your health care provider a list of all the medicines, herbs, non-prescription drugs, or dietary supplements you use. Also tell them if you smoke, drink alcohol, or use illegal drugs. Some items may interact with your medicine. What should I watch for while using this medicine? Tell your doctor if your symptoms do not get better or if they get worse. Visit your doctor or health care professional for regular checks on your progress. Because it may take several weeks to see the full effects of this medicine, it is important to continue your treatment as prescribed by your doctor. Patients and their families should watch out for new or worsening thoughts of suicide or depression. Also watch out for sudden changes in feelings such as feeling anxious, agitated, panicky, irritable, hostile, aggressive, impulsive, severely restless, overly excited and hyperactive, or not being able to sleep. If this happens, especially at the beginning of treatment or after a change in dose, call your health care professional. Bonita Quin may get drowsy or dizzy. Do not drive, use  machinery, or do  anything that needs mental alertness until you know how this medicine affects you. Do not stand or sit up quickly, especially if you are an older patient. This reduces the risk of dizzy or fainting spells. Alcohol may interfere with the effect of this medicine. Avoid alcoholic drinks. Your mouth may get dry. Chewing sugarless gum or sucking hard candy, and drinking plenty of water may help. Contact your doctor if the problem does not go away or is severe. What side effects may I notice from receiving this medicine? Side effects that you should report to your doctor or health care professional as soon as possible: -allergic reactions like skin rash, itching or hives, swelling of the face, lips, or tongue -anxious -black, tarry stools -changes in vision -confusion -elevated mood, decreased need for sleep, racing thoughts, impulsive behavior -eye pain -fast, irregular heartbeat -feeling faint or lightheaded, falls -feeling agitated, angry, or irritable -hallucination, loss of contact with reality -loss of balance or coordination -loss of memory -painful or prolonged erections -restlessness, pacing, inability to keep still -seizures -stiff muscles -suicidal thoughts or other mood changes -trouble sleeping -unusual bleeding or bruising -unusually weak or tired -vomiting Side effects that usually do not require medical attention (report to your doctor or health care professional if they continue or are bothersome): -changes in appetite -change in sex drive or performance -headache -increased sweating -indigestion, nausea -tremors This list may not describe all possible side effects. Call your doctor for medical advice about side effects. You may report side effects to FDA at 1-800-FDA-1088. Where should I keep my medicine? Keep out of reach of children. Store at room temperature between 15 and 30 degrees C (59 and 86 degrees F). Throw away any unused medicine after the expiration  date. NOTE: This sheet is a summary. It may not cover all possible information. If you have questions about this medicine, talk to your doctor, pharmacist, or health care provider.  2018 [employer] Standard (2016-02-01 13:20:23) Trazodone tablets What is this medicine? TRAZODONE (TRAZ oh done) is used to treat depression. This medicine may be used for other purposes; ask your health care provider or pharmacist if you have questions. COMMON BRAND NAME(S): Desyrel What should I tell my health care provider before I take this medicine? They need to know if you have any of these conditions: -attempted suicide or thinking about it -bipolar disorder -bleeding problems -glaucoma -heart disease, or previous heart attack -irregular heart beat -kidney or liver disease -low levels of sodium in the blood -an unusual or allergic reaction to trazodone, other medicines, foods, dyes or preservatives -pregnant or trying to get pregnant -breast-feeding How should I use this medicine? Take this medicine by mouth with a glass of water. Follow the directions on the prescription label. Take this medicine shortly after a meal or a light snack. Take your medicine at regular intervals. Do not take your medicine more often than directed. Do not stop taking this medicine suddenly except upon the advice of your doctor. Stopping this medicine too quickly may cause serious side effects or your condition may worsen. A special MedGuide will be given to you by the pharmacist with each prescription and refill. Be sure to read this information carefully each time. Talk to your pediatrician regarding the use of this medicine in children. Special care may be needed. Overdosage: If you think you have taken too much of this medicine contact a poison control center or emergency room at once. NOTE: This medicine is only  for you. Do not share this medicine with others. What if I miss a dose? If you miss a dose, take it as  soon as you can. If it is almost time for your next dose, take only that dose. Do not take double or extra doses. What may interact with this medicine? Do not take this medicine with any of the following medications: -certain medicines for fungal infections like fluconazole, itraconazole, ketoconazole, posaconazole, voriconazole -cisapride -dofetilide -dronedarone -linezolid -MAOIs like Carbex, Eldepryl, Marplan, Nardil, and Parnate -mesoridazine -methylene blue (injected into a vein) -pimozide -saquinavir -thioridazine -ziprasidone This medicine may also interact with the following medications: -alcohol -antiviral medicines for HIV or AIDS -aspirin and aspirin-like medicines -barbiturates like phenobarbital -certain medicines for blood pressure, heart disease, irregular heart beat -certain medicines for depression, anxiety, or psychotic disturbances -certain medicines for migraine headache like almotriptan, eletriptan, frovatriptan, naratriptan, rizatriptan, sumatriptan, zolmitriptan -certain medicines for seizures like carbamazepine and phenytoin -certain medicines for sleep -certain medicines that treat or prevent blood clots like dalteparin, enoxaparin, warfarin -digoxin -fentanyl -lithium -NSAIDS, medicines for pain and inflammation, like ibuprofen or naproxen -other medicines that prolong the QT interval (cause an abnormal heart rhythm) -rasagiline -supplements like St. John's wort, kava kava, valerian -tramadol -tryptophan This list may not describe all possible interactions. Give your health care provider a list of all the medicines, herbs, non-prescription drugs, or dietary supplements you use. Also tell them if you smoke, drink alcohol, or use illegal drugs. Some items may interact with your medicine. What should I watch for while using this medicine? Tell your doctor if your symptoms do not get better or if they get worse. Visit your doctor or health care professional  for regular checks on your progress. Because it may take several weeks to see the full effects of this medicine, it is important to continue your treatment as prescribed by your doctor. Patients and their families should watch out for new or worsening thoughts of suicide or depression. Also watch out for sudden changes in feelings such as feeling anxious, agitated, panicky, irritable, hostile, aggressive, impulsive, severely restless, overly excited and hyperactive, or not being able to sleep. If this happens, especially at the beginning of treatment or after a change in dose, call your health care professional. Bonita Quin may get drowsy or dizzy. Do not drive, use machinery, or do anything that needs mental alertness until you know how this medicine affects you. Do not stand or sit up quickly, especially if you are an older patient. This reduces the risk of dizzy or fainting spells. Alcohol may interfere with the effect of this medicine. Avoid alcoholic drinks. This medicine may cause dry eyes and blurred vision. If you wear contact lenses you may feel some discomfort. Lubricating drops may help. See your eye doctor if the problem does not go away or is severe. Your mouth may get dry. Chewing sugarless gum, sucking hard candy and drinking plenty of water may help. Contact your doctor if the problem does not go away or is severe. What side effects may I notice from receiving this medicine? Side effects that you should report to your doctor or health care professional as soon as possible: -allergic reactions like skin rash, itching or hives, swelling of the face, lips, or tongue -elevated mood, decreased need for sleep, racing thoughts, impulsive behavior -confusion -fast, irregular heartbeat -feeling faint or lightheaded, falls -feeling agitated, angry, or irritable -loss of balance or coordination -painful or prolonged erections -restlessness, pacing, inability to keep still -suicidal  thoughts or other  mood changes -tremors -trouble sleeping -seizures -unusual bleeding or bruising Side effects that usually do not require medical attention (report to your doctor or health care professional if they continue or are bothersome): -change in sex drive or performance -change in appetite or weight -constipation -headache -muscle aches or pains -nausea This list may not describe all possible side effects. Call your doctor for medical advice about side effects. You may report side effects to FDA at 1-800-FDA-1088. Where should I keep my medicine? Keep out of the reach of children. Store at room temperature between 15 and 30 degrees C (59 to 86 degrees F). Protect from light. Keep container tightly closed. Throw away any unused medicine after the expiration date. NOTE: This sheet is a summary. It may not cover all possible information. If you have questions about this medicine, talk to your doctor, pharmacist, or health care provider.  2018 [employer] Standard (2016-01-28 16:57:05) Escitalopram tablets What is this medicine? ESCITALOPRAM (es sye TAL oh pram) is used to treat depression and certain types of anxiety. This medicine may be used for other purposes; ask your health care provider or pharmacist if you have questions. COMMON BRAND NAME(S): Lexapro What should I tell my health care provider before I take this medicine? They need to know if you have any of these conditions: -bipolar disorder or a family history of bipolar disorder -diabetes -glaucoma -heart disease -kidney or liver disease -receiving electroconvulsive therapy -seizures (convulsions) -suicidal thoughts, plans, or attempt by you or a family member -an unusual or allergic reaction to escitalopram, the related drug citalopram, other medicines, foods, dyes, or preservatives -pregnant or trying to become pregnant -breast-feeding How should I use this medicine? Take this medicine by mouth with a glass of water. Follow  the directions on the prescription label. You can take it with or without food. If it upsets your stomach, take it with food. Take your medicine at regular intervals. Do not take it more often than directed. Do not stop taking this medicine suddenly except upon the advice of your doctor. Stopping this medicine too quickly may cause serious side effects or your condition may worsen. A special MedGuide will be given to you by the pharmacist with each prescription and refill. Be sure to read this information carefully each time. Talk to your pediatrician regarding the use of this medicine in children. Special care may be needed. Overdosage: If you think you have taken too much of this medicine contact a poison control center or emergency room at once. NOTE: This medicine is only for you. Do not share this medicine with others. What if I miss a dose? If you miss a dose, take it as soon as you can. If it is almost time for your next dose, take only that dose. Do not take double or extra doses. What may interact with this medicine? Do not take this medicine with any of the following medications: -certain medicines for fungal infections like fluconazole, itraconazole, ketoconazole, posaconazole, voriconazole -cisapride -citalopram -dofetilide -dronedarone -linezolid -MAOIs like Carbex, Eldepryl, Marplan, Nardil, and Parnate -methylene blue (injected into a vein) -pimozide -thioridazine -ziprasidone This medicine may also interact with the following medications: -alcohol -amphetamines -aspirin and aspirin-like medicines -carbamazepine -certain medicines for depression, anxiety, or psychotic disturbances -certain medicines for migraine headache like almotriptan, eletriptan, frovatriptan, naratriptan, rizatriptan, sumatriptan, zolmitriptan -certain medicines for sleep -certain medicines that treat or prevent blood clots like warfarin, enoxaparin,  dalteparin -cimetidine -diuretics -fentanyl -furazolidone -isoniazid -lithium -metoprolol -NSAIDs, medicines  for pain and inflammation, like ibuprofen or naproxen -other medicines that prolong the QT interval (cause an abnormal heart rhythm) -procarbazine -rasagiline -supplements like St. John's wort, kava kava, valerian -tramadol -tryptophan This list may not describe all possible interactions. Give your health care provider a list of all the medicines, herbs, non-prescription drugs, or dietary supplements you use. Also tell them if you smoke, drink alcohol, or use illegal drugs. Some items may interact with your medicine. What should I watch for while using this medicine? Tell your doctor if your symptoms do not get better or if they get worse. Visit your doctor or health care professional for regular checks on your progress. Because it may take several weeks to see the full effects of this medicine, it is important to continue your treatment as prescribed by your doctor. Patients and their families should watch out for new or worsening thoughts of suicide or depression. Also watch out for sudden changes in feelings such as feeling anxious, agitated, panicky, irritable, hostile, aggressive, impulsive, severely restless, overly excited and hyperactive, or not being able to sleep. If this happens, especially at the beginning of treatment or after a change in dose, call your health care professional. Bonita QuinYou may get drowsy or dizzy. Do not drive, use machinery, or do anything that needs mental alertness until you know how this medicine affects you. Do not stand or sit up quickly, especially if you are an older patient. This reduces the risk of dizzy or fainting spells. Alcohol may interfere with the effect of this medicine. Avoid alcoholic drinks. Your mouth may get dry. Chewing sugarless gum or sucking hard candy, and drinking plenty of water may help. Contact your doctor if the problem does not go  away or is severe. What side effects may I notice from receiving this medicine? Side effects that you should report to your doctor or health care professional as soon as possible: -allergic reactions like skin rash, itching or hives, swelling of the face, lips, or tongue -anxious -black, tarry stools -changes in vision -confusion -elevated mood, decreased need for sleep, racing thoughts, impulsive behavior -eye pain -fast, irregular heartbeat -feeling faint or lightheaded, falls -feeling agitated, angry, or irritable -hallucination, loss of contact with reality -loss of balance or coordination -loss of memory -painful or prolonged erections -restlessness, pacing, inability to keep still -seizures -stiff muscles -suicidal thoughts or other mood changes -trouble sleeping -unusual bleeding or bruising -unusually weak or tired -vomiting Side effects that usually do not require medical attention (report to your doctor or health care professional if they continue or are bothersome): -changes in appetite -change in sex drive or performance -headache -increased sweating -indigestion, nausea -tremors This list may not describe all possible side effects. Call your doctor for medical advice about side effects. You may report side effects to FDA at 1-800-FDA-1088. Where should I keep my medicine? Keep out of reach of children. Store at room temperature between 15 and 30 degrees C (59 and 86 degrees F). Throw away any unused medicine after the expiration date. NOTE: This sheet is a summary. It may not cover all possible information. If you have questions about this medicine, talk to your doctor, pharmacist, or health care provider.  2018 Elsevier/Gold Standard (2016-02-01 13:20:23)
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
## 2017-11-06 ENCOUNTER — Encounter: Payer: BLUE CROSS/BLUE SHIELD | Admitting: Family Medicine

## 2017-11-21 ENCOUNTER — Encounter: Payer: BLUE CROSS/BLUE SHIELD | Admitting: Family Medicine

## 2017-12-26 DIAGNOSIS — O99211 Obesity complicating pregnancy, first trimester: Secondary | ICD-10-CM | POA: Insufficient documentation

## 2017-12-26 LAB — OB RESULTS CONSOLE HEPATITIS B SURFACE ANTIGEN: HEP B S AG: NEGATIVE

## 2017-12-26 LAB — OB RESULTS CONSOLE RPR: RPR: NONREACTIVE

## 2017-12-26 LAB — OB RESULTS CONSOLE VARICELLA ZOSTER ANTIBODY, IGG: VARICELLA IGG: IMMUNE

## 2017-12-26 LAB — OB RESULTS CONSOLE GBS: GBS: NEGATIVE

## 2017-12-26 LAB — OB RESULTS CONSOLE HIV ANTIBODY (ROUTINE TESTING): HIV: NONREACTIVE

## 2017-12-26 LAB — OB RESULTS CONSOLE GC/CHLAMYDIA
Chlamydia: NEGATIVE
GC PROBE AMP, GENITAL: NEGATIVE

## 2017-12-26 LAB — OB RESULTS CONSOLE RUBELLA ANTIBODY, IGM: RUBELLA: NON-IMMUNE/NOT IMMUNE

## 2018-05-31 ENCOUNTER — Other Ambulatory Visit: Payer: Self-pay

## 2018-05-31 ENCOUNTER — Observation Stay
Admission: EM | Admit: 2018-05-31 | Discharge: 2018-05-31 | Disposition: A | Discharge status: Home / Self Care | Payer: BLUE CROSS/BLUE SHIELD | Attending: Certified Nurse Midwife | Admitting: Certified Nurse Midwife

## 2018-05-31 DIAGNOSIS — Z818 Family history of other mental and behavioral disorders: Secondary | ICD-10-CM | POA: Diagnosis not present

## 2018-05-31 DIAGNOSIS — Z9889 Other specified postprocedural states: Secondary | ICD-10-CM | POA: Diagnosis not present

## 2018-05-31 DIAGNOSIS — Z8249 Family history of ischemic heart disease and other diseases of the circulatory system: Secondary | ICD-10-CM | POA: Insufficient documentation

## 2018-05-31 DIAGNOSIS — Z888 Allergy status to other drugs, medicaments and biological substances status: Secondary | ICD-10-CM | POA: Insufficient documentation

## 2018-05-31 DIAGNOSIS — O99213 Obesity complicating pregnancy, third trimester: Secondary | ICD-10-CM | POA: Insufficient documentation

## 2018-05-31 DIAGNOSIS — O163 Unspecified maternal hypertension, third trimester: Secondary | ICD-10-CM | POA: Diagnosis present

## 2018-05-31 DIAGNOSIS — Z3A33 33 weeks gestation of pregnancy: Secondary | ICD-10-CM | POA: Insufficient documentation

## 2018-05-31 DIAGNOSIS — Z7982 Long term (current) use of aspirin: Secondary | ICD-10-CM | POA: Insufficient documentation

## 2018-05-31 DIAGNOSIS — O26893 Other specified pregnancy related conditions, third trimester: Secondary | ICD-10-CM | POA: Diagnosis present

## 2018-05-31 DIAGNOSIS — R030 Elevated blood-pressure reading, without diagnosis of hypertension: Secondary | ICD-10-CM | POA: Diagnosis not present

## 2018-05-31 DIAGNOSIS — F329 Major depressive disorder, single episode, unspecified: Secondary | ICD-10-CM | POA: Diagnosis not present

## 2018-05-31 DIAGNOSIS — Z91013 Allergy to seafood: Secondary | ICD-10-CM | POA: Diagnosis not present

## 2018-05-31 DIAGNOSIS — Z8619 Personal history of other infectious and parasitic diseases: Secondary | ICD-10-CM | POA: Diagnosis not present

## 2018-05-31 DIAGNOSIS — Z8759 Personal history of other complications of pregnancy, childbirth and the puerperium: Secondary | ICD-10-CM | POA: Diagnosis not present

## 2018-05-31 DIAGNOSIS — O1203 Gestational edema, third trimester: Secondary | ICD-10-CM | POA: Insufficient documentation

## 2018-05-31 DIAGNOSIS — Z79899 Other long term (current) drug therapy: Secondary | ICD-10-CM | POA: Insufficient documentation

## 2018-05-31 DIAGNOSIS — F909 Attention-deficit hyperactivity disorder, unspecified type: Secondary | ICD-10-CM | POA: Diagnosis not present

## 2018-05-31 DIAGNOSIS — O99343 Other mental disorders complicating pregnancy, third trimester: Secondary | ICD-10-CM | POA: Insufficient documentation

## 2018-05-31 DIAGNOSIS — Z87891 Personal history of nicotine dependence: Secondary | ICD-10-CM | POA: Diagnosis not present

## 2018-05-31 LAB — CBC
HCT: 35.1 % (ref 35.0–47.0)
Hemoglobin: 12.2 g/dL (ref 12.0–16.0)
MCH: 31.8 pg (ref 26.0–34.0)
MCHC: 34.7 g/dL (ref 32.0–36.0)
MCV: 91.6 fL (ref 80.0–100.0)
PLATELETS: 265 10*3/uL (ref 150–440)
RBC: 3.83 MIL/uL (ref 3.80–5.20)
RDW: 12.8 % (ref 11.5–14.5)
WBC: 11.5 10*3/uL — ABNORMAL HIGH (ref 3.6–11.0)

## 2018-05-31 LAB — COMPREHENSIVE METABOLIC PANEL
ALT: 16 U/L (ref 0–44)
AST: 19 U/L (ref 15–41)
Albumin: 3.1 g/dL — ABNORMAL LOW (ref 3.5–5.0)
Alkaline Phosphatase: 93 U/L (ref 38–126)
Anion gap: 9 (ref 5–15)
BILIRUBIN TOTAL: 0.4 mg/dL (ref 0.3–1.2)
BUN: 5 mg/dL — AB (ref 6–20)
CHLORIDE: 109 mmol/L (ref 98–111)
CO2: 21 mmol/L — ABNORMAL LOW (ref 22–32)
CREATININE: 0.46 mg/dL (ref 0.44–1.00)
Calcium: 8.6 mg/dL — ABNORMAL LOW (ref 8.9–10.3)
Glucose, Bld: 90 mg/dL (ref 70–99)
Potassium: 3.4 mmol/L — ABNORMAL LOW (ref 3.5–5.1)
Sodium: 139 mmol/L (ref 135–145)
TOTAL PROTEIN: 6.4 g/dL — AB (ref 6.5–8.1)

## 2018-05-31 LAB — PROTEIN / CREATININE RATIO, URINE
CREATININE, URINE: 159 mg/dL
Protein Creatinine Ratio: 0.08 mg/mg{Cre} (ref 0.00–0.15)
TOTAL PROTEIN, URINE: 13 mg/dL

## 2018-05-31 MED ORDER — ACETAMINOPHEN 325 MG PO TABS: 650.0000 mg | ORAL_TABLET | ORAL | Status: DC | PRN

## 2018-05-31 NOTE — Discharge Summary (Signed)
Kimberly Knapp is a 32 y.o. female. She is at 5175w3d gestation. Patient's last menstrual period was 09/15/2017. Estimated Date of Delivery: 07/16/18  [facility]  Chief complaint: Elevated BP in the office Associated signs/symptoms: lower extremity edema that is worse than it has been Context: Kimberly Knapp was seen in the office today with an elevated BP of 150/90. She has a history of preeclampsia with two prior pregnancies, occurring at 6110w5d with 1st baby and 37w with second baby.   S: Resting comfortably.   She reports:  -active fetal movement -no leakage of fluid  -no vaginal bleeding -no contractions -no headache or vision changes -no epigastric or RUQ pain -some increased lower extremity edema  Maternal Medical History:   Past Medical History:  Diagnosis Date  . ADHD (attention deficit hyperactivity disorder)   . Depression   . Goiter   . Herpes genitalis   . Obesity (BMI 30.0-34.9)   . Pregnancy induced hypertension     Past Surgical History:  Procedure Laterality Date  . BREAST ENHANCEMENT SURGERY Bilateral   . CESAREAN SECTION N/A 05/29/2015   Procedure: CESAREAN SECTION;  Surgeon: Kimberly Fenderhelsea C [name], Knapp;  Location: [facility] ORS;  Service: Obstetrics;  Laterality: N/A;  . DILATION AND CURETTAGE OF UTERUS    . THYROID SURGERY     thyroid biopsy  . WISDOM TOOTH EXTRACTION      Allergies  Allergen Reactions  . Iodine Swelling  . Shrimp [Shellfish Allergy] Swelling    Prior to Admission medications   Medication Sig Start Date End Date Taking? Authorizing Provider  aspirin EC 81 MG tablet Take 81 mg by mouth daily.   Yes Provider, Historical, Knapp  Prenatal Vit-Fe Fumarate-FA (MULTIVITAMIN-PRENATAL) 27-0.8 MG TABS tablet Take 1 tablet by mouth daily at 12 noon.   Yes Provider, Historical, Knapp  amphetamine-dextroamphetamine (ADDERALL XR) 20 MG 24 hr capsule Take 1 capsule (20 mg total) by mouth daily. Patient not taking: Reported on 05/31/2018  10/13/17   Kimberly Knapp  amphetamine-dextroamphetamine (ADDERALL) 5 MG tablet Take 1 tablet (5 mg total) by mouth daily as needed. In the afternoon Patient not taking: Reported on 05/31/2018 10/13/17   Kimberly Knapp  escitalopram (LEXAPRO) 5 MG tablet Take 1 tablet (5 mg total) by mouth daily. Patient not taking: Reported on 05/31/2018 10/13/17   Kimberly Knapp  traZODone (DESYREL) 50 MG tablet Take 0.5-1 tablets (25-50 mg total) by mouth at bedtime as needed for sleep. Patient not taking: Reported on 05/31/2018 10/13/17   Kimberly Knapp     Social History: She  reports that she quit smoking about 3 years ago. Her smoking use included cigarettes. She has never used smokeless tobacco. She reports that she does not drink alcohol or use drugs.  Family History: family history includes ADD / ADHD in her father; Alcohol abuse in her maternal grandfather; Diabetes type II in her unknown relative; Heart disease in her unknown relative; Lung cancer in her unknown relative and unknown relative.   Review of Systems: A full review of systems was performed and negative except as noted in the HPI.    O:  BP 131/84   Pulse 85   Temp 98.1 F (36.7 C) (Oral)   Resp 16   Ht 5\' 7"  (1.702 m)   Wt 113.4 kg   LMP 09/15/2017   BMI 39.16 kg/m  Results for orders placed or performed during the hospital encounter of 05/31/18 (from the past 48 hour(s))  Protein / creatinine ratio, urine   Collection Time: 05/31/18  4:55 PM  Result Value Ref Range   Creatinine, Urine 159 mg/dL   Total Protein, Urine 13 mg/dL   Protein Creatinine Ratio 0.08 0.00 - 0.15 mg/mg[Cre]  Comprehensive metabolic panel   Collection Time: 05/31/18  5:08 PM  Result Value Ref Range   Sodium 139 135 - 145 mmol/L   Potassium 3.4 (L) 3.5 - 5.1 mmol/L   Chloride 109 98 - 111 mmol/L   CO2 21 (L) 22 - 32 mmol/L   Glucose, Bld 90 70 - 99 mg/dL   BUN 5 (L) 6 - 20 mg/dL   Creatinine, Ser 1.61 0.44 - 1.00 mg/dL   Calcium 8.6  (L) 8.9 - 10.3 mg/dL   Total Protein 6.4 (L) 6.5 - 8.1 g/dL   Albumin 3.1 (L) 3.5 - 5.0 g/dL   AST 19 15 - 41 U/L   ALT 16 0 - 44 U/L   Alkaline Phosphatase 93 38 - 126 U/L   Total Bilirubin 0.4 0.3 - 1.2 mg/dL   GFR calc non Af Amer >60 >60 mL/min   GFR calc Af Amer >60 >60 mL/min   Anion gap 9 5 - 15  CBC   Collection Time: 05/31/18  5:08 PM  Result Value Ref Range   WBC 11.5 (H) 3.6 - 11.0 K/uL   RBC 3.83 3.80 - 5.20 MIL/uL   Hemoglobin 12.2 12.0 - 16.0 g/dL   HCT 09.6 04.5 - 40.9 %   MCV 91.6 80.0 - 100.0 fL   MCH 31.8 26.0 - 34.0 pg   MCHC 34.7 32.0 - 36.0 g/dL   RDW 81.1 91.4 - 78.2 %   Platelets 265 150 - 440 K/uL     Constitutional: NAD, AAOx3  HE/ENT: extraocular movements grossly intact, moist mucous membranes CV: RRR PULM: normal respiratory effort, CTABL     Abd: gravid, non-tender, non-distended, soft      Ext: Non-tender, +2 b/l LE non-pitting edema   Psych: mood appropriate, speech normal Pelvic deferred  NST:  Baseline: 130 Variability: moderate Accelerations: 15x15s present x >2 Decelerations: one variable Time: Toco: quiet   A/P: 32 y.o. [redacted]w[redacted]d here for antenatal surveillance for elevated blood pressure with history of preeclampsia.   Labor  Not present  Fetal Wellbeing  Reactive NST, reassuring for GA  Elevated BP  BPs mostly normotensive with one mild range value  Elevated BPs ~2 hours apart, so does not meet criteria for gestational hypertension at this point  CBC, CMP, and P/C ratio WNL  D/c home stable, precautions reviewed, follow-up as scheduled.  Advised to check BP at home and call with any elevated readings   Advised to call the office tomorrow to set up BP check for Monday   Kimberly Knapp 05/31/2018 6:33 PM  ----- Kimberly Knapp, CNM Certified Nurse Midwife [facility], Department of OB/GYN [facility]

## 2018-05-31 NOTE — Discharge Instructions (Signed)
Please call [phone] [clinic M-F 8:30a-4:30p] or [phone] [L&D] if you have any of the following symptoms: -Swelling of the face or hands -Headache that will not go away -Blurry vision, white or black spots in your vision, bright lights/stars in your vision -Pain on the right, upper side of your abdomen that aches and is not from the baby's feet kicking you -Pain in the upper, middle of your abdomen, directly under your ribcage -Nausea or vomiting/unable to keep food or liquids down -Difficulty breathing  

## 2018-06-07 ENCOUNTER — Inpatient Hospital Stay
Admission: EM | Admit: 2018-06-07 | Discharge: 2018-06-07 | Disposition: A | Discharge status: Home / Self Care | Payer: BLUE CROSS/BLUE SHIELD | Attending: Obstetrics & Gynecology | Admitting: Obstetrics & Gynecology

## 2018-06-07 DIAGNOSIS — O133 Gestational [pregnancy-induced] hypertension without significant proteinuria, third trimester: Secondary | ICD-10-CM | POA: Diagnosis present

## 2018-06-07 DIAGNOSIS — Z3A35 35 weeks gestation of pregnancy: Secondary | ICD-10-CM | POA: Diagnosis not present

## 2018-06-07 MED ORDER — BETAMETHASONE SOD PHOS & ACET 6 (3-3) MG/ML IJ SUSP: 12.0000 mg | INTRAMUSCULAR | Status: DC

## 2018-06-07 MED ORDER — BETAMETHASONE SOD PHOS & ACET 6 (3-3) MG/ML IJ SUSP
INTRAMUSCULAR | Status: AC
  Administered 2018-06-07: 30 mg
  Filled 2018-06-07: qty 5

## 2018-06-07 NOTE — Discharge Summary (Signed)
RN gave patient her first dose of betamethasone. Instructed patient to return tomorrow for her second dose. RN monitored patient for reaction.No reactions noted. Reviewed discharge instructions with patient. Patient verbalized understanding. Pt discharged home

## 2018-06-08 ENCOUNTER — Observation Stay
Admission: EM | Admit: 2018-06-08 | Discharge: 2018-06-08 | Disposition: A | Discharge status: Home / Self Care | Payer: BLUE CROSS/BLUE SHIELD | Attending: Obstetrics and Gynecology | Admitting: Obstetrics and Gynecology

## 2018-06-08 DIAGNOSIS — O163 Unspecified maternal hypertension, third trimester: Secondary | ICD-10-CM | POA: Insufficient documentation

## 2018-06-08 DIAGNOSIS — Z3A34 34 weeks gestation of pregnancy: Secondary | ICD-10-CM | POA: Diagnosis not present

## 2018-06-08 MED ORDER — BETAMETHASONE SOD PHOS & ACET 6 (3-3) MG/ML IJ SUSP
INTRAMUSCULAR | Status: AC
  Filled 2018-06-08: qty 5

## 2018-06-08 MED ORDER — BETAMETHASONE SOD PHOS & ACET 6 (3-3) MG/ML IJ SUSP
12.0000 mg | Freq: Once | INTRAMUSCULAR | Status: AC
  Administered 2018-06-08: 12 mg via INTRAMUSCULAR

## 2018-06-11 NOTE — Discharge Summary (Addendum)
Pt presented for completion dose of steroids without complication at 34+[redacted]wks EGA  Final diagnosis: Preterm contractions, elevated blood pressure

## 2018-06-18 DIAGNOSIS — O321XX0 Maternal care for breech presentation, not applicable or unspecified: Secondary | ICD-10-CM | POA: Insufficient documentation

## 2018-06-18 DIAGNOSIS — O34219 Maternal care for unspecified type scar from previous cesarean delivery: Secondary | ICD-10-CM | POA: Insufficient documentation

## 2018-06-22 ENCOUNTER — Other Ambulatory Visit: Payer: Self-pay

## 2018-06-22 ENCOUNTER — Encounter
Admission: RE | Admit: 2018-06-22 | Discharge: 2018-06-22 | Disposition: A | Discharge status: Home / Self Care | Payer: BLUE CROSS/BLUE SHIELD | Source: Ambulatory Visit | Attending: Obstetrics & Gynecology | Admitting: Obstetrics & Gynecology

## 2018-06-22 DIAGNOSIS — Z3A00 Weeks of gestation of pregnancy not specified: Secondary | ICD-10-CM | POA: Diagnosis not present

## 2018-06-22 DIAGNOSIS — Z01818 Encounter for other preprocedural examination: Secondary | ICD-10-CM | POA: Diagnosis present

## 2018-06-22 DIAGNOSIS — O139 Gestational [pregnancy-induced] hypertension without significant proteinuria, unspecified trimester: Secondary | ICD-10-CM | POA: Insufficient documentation

## 2018-06-22 NOTE — Pre-Procedure Instructions (Signed)
No orders at time of PAT visit, this RN called L&D nursing station.  Pt will need blood drawn on DOS, ok to do any ordered blood work on [employer] including T&S.  Paper chart sent to L&D.

## 2018-06-22 NOTE — Patient Instructions (Addendum)
  Your procedure is scheduled on: Monday June 25, 2018 Report to [facility] Emergency Department at 5:30 am  Remember: Instructions that are not followed completely may result in serious medical risk, up to and including death, or upon the discretion of your surgeon and anesthesiologist your surgery may need to be rescheduled.    __x__ 1. Do not eat food (including mints, candies, chewing gum) after midnight the night before your procedure. You may drink clear liquids up to 2 hours before you are scheduled to arrive at the hospital for your procedure.  Do not drink anything within 2 hours of your scheduled arrival to the hospital.  Approved clear liquids:  --Water or Apple juice without pulp  --Clear carbohydrate beverage such as Gatorade or Powerade  --Black Coffee or Clear Tea (No milk, no creamers, do not add anything to the coffee or tea)    __x__ 2. No Alcohol for 24 hours before or after surgery.   __x__ 3. No Smoking or e-cigarettes for 24 hours before surgery.  Do not use any chewable tobacco products for at least 6 hours before surgery.   __x__ 4. Notify your doctor if there is any change in your medical condition (cold, fever, infections).   __x__ 5. On the morning of surgery brush your teeth with toothpaste and water.  You may rinse your mouth with mouthwash if you wish.  Do not swallow any toothpaste or mouthwash.  Please read over the following fact sheets that you were given:   [facility] Preparing for Surgery and/or MRSA Information    __x__ Use CHG Soap or Sage wipes as directed on instruction sheet   Do not wear jewelry, make-up, hairpins, clips or nail polish on the day of surgery.  Do not wear lotions, powders, deodorant, or perfumes.   Do not shave below the face/neck 48 hours prior to surgery.   Do not bring valuables to the hospital.    [facility] is not responsible for any belongings or valuables.               Contacts, dentures or bridgework may not be worn  into surgery.  Leave your suitcase in the car. After surgery it may be brought to your room.  For patients admitted to the hospital, discharge time is determined by your treatment team.  __x__ Take anti-hypertensive listed below, cardiac, seizure, asthma, anti-reflux and psychiatric medicines. These include:  1. None  __x__ Follow recommendations from Cardiologist, Pulmonologist or PCP regarding stopping Aspirin, Coumadin, Plavix, Eliquis, Effient, Pradaxa, and Pletal.  __x__ Stop Anti-inflammatories such as Advil, Ibuprofen, Motrin, Aleve, Naproxen, Naprosyn, BC/Goodies powders or aspirin products. You may continue to take Tylenol and Celebrex.   __x__ Stop supplements until after surgery. You may continue to take Vitamin D, Vitamin B, and multivitamin.

## 2018-06-25 ENCOUNTER — Other Ambulatory Visit: Payer: Self-pay

## 2018-06-25 ENCOUNTER — Inpatient Hospital Stay
Admission: RE | Admit: 2018-06-25 | Discharge: 2018-06-28 | DRG: 787 | Disposition: A | Discharge status: Home / Self Care | Payer: BLUE CROSS/BLUE SHIELD | Attending: Obstetrics & Gynecology | Admitting: Obstetrics & Gynecology

## 2018-06-25 ENCOUNTER — Inpatient Hospital Stay: Payer: BLUE CROSS/BLUE SHIELD | Admitting: Certified Registered Nurse Anesthetist

## 2018-06-25 ENCOUNTER — Encounter
Admission: RE | Disposition: A | Discharge status: Home / Self Care | Payer: Self-pay | Source: Home / Self Care | Attending: Obstetrics & Gynecology

## 2018-06-25 ENCOUNTER — Encounter: Payer: Self-pay | Admitting: Obstetrics and Gynecology

## 2018-06-25 DIAGNOSIS — M5126 Other intervertebral disc displacement, lumbar region: Secondary | ICD-10-CM | POA: Diagnosis present

## 2018-06-25 DIAGNOSIS — O34211 Maternal care for low transverse scar from previous cesarean delivery: Secondary | ICD-10-CM | POA: Diagnosis present

## 2018-06-25 DIAGNOSIS — O3403 Maternal care for unspecified congenital malformation of uterus, third trimester: Secondary | ICD-10-CM | POA: Diagnosis present

## 2018-06-25 DIAGNOSIS — D62 Acute posthemorrhagic anemia: Secondary | ICD-10-CM | POA: Diagnosis not present

## 2018-06-25 DIAGNOSIS — O134 Gestational [pregnancy-induced] hypertension without significant proteinuria, complicating childbirth: Secondary | ICD-10-CM | POA: Diagnosis present

## 2018-06-25 DIAGNOSIS — Z87891 Personal history of nicotine dependence: Secondary | ICD-10-CM | POA: Diagnosis not present

## 2018-06-25 DIAGNOSIS — O99214 Obesity complicating childbirth: Secondary | ICD-10-CM | POA: Diagnosis present

## 2018-06-25 DIAGNOSIS — O9832 Other infections with a predominantly sexual mode of transmission complicating childbirth: Secondary | ICD-10-CM | POA: Diagnosis present

## 2018-06-25 DIAGNOSIS — Z3A37 37 weeks gestation of pregnancy: Secondary | ICD-10-CM | POA: Diagnosis not present

## 2018-06-25 DIAGNOSIS — E669 Obesity, unspecified: Secondary | ICD-10-CM | POA: Diagnosis present

## 2018-06-25 DIAGNOSIS — O321XX0 Maternal care for breech presentation, not applicable or unspecified: Secondary | ICD-10-CM | POA: Diagnosis present

## 2018-06-25 DIAGNOSIS — O9081 Anemia of the puerperium: Secondary | ICD-10-CM | POA: Diagnosis not present

## 2018-06-25 DIAGNOSIS — A6000 Herpesviral infection of urogenital system, unspecified: Secondary | ICD-10-CM | POA: Diagnosis present

## 2018-06-25 DIAGNOSIS — Q51810 Arcuate uterus: Secondary | ICD-10-CM

## 2018-06-25 LAB — CBC WITH DIFFERENTIAL/PLATELET
Abs Immature Granulocytes: 0.08 10*3/uL — ABNORMAL HIGH (ref 0.00–0.07)
BASOS ABS: 0 10*3/uL (ref 0.0–0.1)
Basophils Relative: 0 %
EOS PCT: 1 %
Eosinophils Absolute: 0.1 10*3/uL (ref 0.0–0.5)
HCT: 33.4 % — ABNORMAL LOW (ref 36.0–46.0)
Hemoglobin: 11.4 g/dL — ABNORMAL LOW (ref 12.0–15.0)
Immature Granulocytes: 1 %
Lymphocytes Relative: 31 %
Lymphs Abs: 2.5 10*3/uL (ref 0.7–4.0)
MCH: 31.1 pg (ref 26.0–34.0)
MCHC: 34.1 g/dL (ref 30.0–36.0)
MCV: 91.3 fL (ref 80.0–100.0)
Monocytes Absolute: 0.7 10*3/uL (ref 0.1–1.0)
Monocytes Relative: 9 %
NRBC: 0 % (ref 0.0–0.2)
Neutro Abs: 4.9 10*3/uL (ref 1.7–7.7)
Neutrophils Relative %: 58 %
Platelets: 227 10*3/uL (ref 150–400)
RBC: 3.66 MIL/uL — AB (ref 3.87–5.11)
RDW: 13.1 % (ref 11.5–15.5)
WBC: 8.3 10*3/uL (ref 4.0–10.5)

## 2018-06-25 LAB — TYPE AND SCREEN
ABO/RH(D): O POS
ANTIBODY SCREEN: NEGATIVE

## 2018-06-25 SURGERY — Surgical Case
Anesthesia: Spinal | Site: Abdomen

## 2018-06-25 MED ORDER — INFLUENZA VAC SPLIT QUAD 0.5 ML IM SUSY: 0.5000 mL | PREFILLED_SYRINGE | INTRAMUSCULAR | Status: DC | PRN

## 2018-06-25 MED ORDER — MORPHINE SULFATE (PF) 0.5 MG/ML IJ SOLN
INTRAMUSCULAR | Status: AC
  Filled 2018-06-25: qty 10

## 2018-06-25 MED ORDER — SODIUM CHLORIDE 0.9 % IV SOLN: 250.0000 mL | INTRAVENOUS | Status: DC

## 2018-06-25 MED ORDER — SODIUM CHLORIDE 0.9 % IJ SOLN
INTRAMUSCULAR | Status: AC
  Filled 2018-06-25: qty 50

## 2018-06-25 MED ORDER — MEASLES, MUMPS & RUBELLA VAC ~~LOC~~ INJ
0.5000 mL | INJECTION | Freq: Once | SUBCUTANEOUS | Status: AC
  Administered 2018-06-26: 0.5 mL via SUBCUTANEOUS
  Filled 2018-06-25 (×3): qty 0.5

## 2018-06-25 MED ORDER — SOD CITRATE-CITRIC ACID 500-334 MG/5ML PO SOLN
ORAL | Status: AC
  Administered 2018-06-25: 30 mL via ORAL
  Filled 2018-06-25: qty 30

## 2018-06-25 MED ORDER — NALBUPHINE HCL 10 MG/ML IJ SOLN: 5.0000 mg | INTRAMUSCULAR | Status: DC | PRN

## 2018-06-25 MED ORDER — ONDANSETRON HCL 4 MG/2ML IJ SOLN
INTRAMUSCULAR | Status: AC
  Filled 2018-06-25: qty 2

## 2018-06-25 MED ORDER — NALOXONE HCL 0.4 MG/ML IJ SOLN: 0.4000 mg | INTRAMUSCULAR | Status: DC | PRN

## 2018-06-25 MED ORDER — ONDANSETRON HCL 4 MG/2ML IJ SOLN: 4.0000 mg | Freq: Three times a day (TID) | INTRAMUSCULAR | Status: DC | PRN

## 2018-06-25 MED ORDER — SODIUM CHLORIDE 0.9% FLUSH: 3.0000 mL | INTRAVENOUS | Status: DC | PRN

## 2018-06-25 MED ORDER — DIPHENHYDRAMINE HCL 50 MG/ML IJ SOLN
INTRAMUSCULAR | Status: DC | PRN
  Administered 2018-06-25: 25 mg via INTRAVENOUS

## 2018-06-25 MED ORDER — FENTANYL CITRATE (PF) 100 MCG/2ML IJ SOLN
INTRAMUSCULAR | Status: AC
  Filled 2018-06-25: qty 2

## 2018-06-25 MED ORDER — PHENYLEPHRINE HCL 10 MG/ML IJ SOLN
INTRAMUSCULAR | Status: AC
  Filled 2018-06-25: qty 1

## 2018-06-25 MED ORDER — BUPIVACAINE HCL (PF) 0.5 % IJ SOLN
INTRAMUSCULAR | Status: AC
  Filled 2018-06-25: qty 30

## 2018-06-25 MED ORDER — MENTHOL 3 MG MT LOZG
1.0000 | LOZENGE | OROMUCOSAL | Status: DC | PRN
  Filled 2018-06-25: qty 9

## 2018-06-25 MED ORDER — IBUPROFEN 600 MG PO TABS
600.0000 mg | ORAL_TABLET | Freq: Four times a day (QID) | ORAL | Status: DC
  Administered 2018-06-26: 600 mg via ORAL
  Filled 2018-06-25: qty 1

## 2018-06-25 MED ORDER — DEXAMETHASONE SODIUM PHOSPHATE 10 MG/ML IJ SOLN
INTRAMUSCULAR | Status: AC
  Filled 2018-06-25: qty 1

## 2018-06-25 MED ORDER — NALBUPHINE HCL 10 MG/ML IJ SOLN: 5.0000 mg | Freq: Once | INTRAMUSCULAR | Status: DC | PRN

## 2018-06-25 MED ORDER — SODIUM CHLORIDE 0.9% FLUSH
3.0000 mL | Freq: Two times a day (BID) | INTRAVENOUS | Status: DC
  Administered 2018-06-26: 3 mL via INTRAVENOUS

## 2018-06-25 MED ORDER — DIPHENHYDRAMINE HCL 50 MG/ML IJ SOLN: 12.5000 mg | INTRAMUSCULAR | Status: DC | PRN

## 2018-06-25 MED ORDER — DIPHENHYDRAMINE HCL 25 MG PO CAPS
25.0000 mg | ORAL_CAPSULE | Freq: Four times a day (QID) | ORAL | Status: DC | PRN
  Filled 2018-06-25: qty 1

## 2018-06-25 MED ORDER — MORPHINE SULFATE (PF) 0.5 MG/ML IJ SOLN
INTRAMUSCULAR | Status: DC | PRN
  Administered 2018-06-25: .1 mg via INTRATHECAL

## 2018-06-25 MED ORDER — SIMETHICONE 80 MG PO CHEW
160.0000 mg | CHEWABLE_TABLET | Freq: Four times a day (QID) | ORAL | Status: DC | PRN
  Administered 2018-06-25 – 2018-06-28 (×3): 160 mg via ORAL
  Filled 2018-06-25 (×3): qty 2

## 2018-06-25 MED ORDER — FAMOTIDINE 20 MG PO TABS: 20.0000 mg | ORAL_TABLET | Freq: Once | ORAL | Status: DC

## 2018-06-25 MED ORDER — WITCH HAZEL-GLYCERIN EX PADS: 1.0000 "application " | MEDICATED_PAD | CUTANEOUS | Status: DC | PRN

## 2018-06-25 MED ORDER — SENNOSIDES-DOCUSATE SODIUM 8.6-50 MG PO TABS
2.0000 | ORAL_TABLET | ORAL | Status: DC
  Administered 2018-06-26 – 2018-06-27 (×3): 2 via ORAL
  Filled 2018-06-25 (×3): qty 2

## 2018-06-25 MED ORDER — OXYCODONE HCL 5 MG PO TABS
5.0000 mg | ORAL_TABLET | ORAL | Status: DC | PRN
  Administered 2018-06-26 – 2018-06-27 (×2): 5 mg via ORAL
  Filled 2018-06-25: qty 1

## 2018-06-25 MED ORDER — KETOROLAC TROMETHAMINE 30 MG/ML IJ SOLN
30.0000 mg | Freq: Four times a day (QID) | INTRAMUSCULAR | Status: AC
  Administered 2018-06-25 – 2018-06-26 (×4): 30 mg via INTRAVENOUS
  Filled 2018-06-25 (×4): qty 1

## 2018-06-25 MED ORDER — SOD CITRATE-CITRIC ACID 500-334 MG/5ML PO SOLN
30.0000 mL | ORAL | Status: AC
  Administered 2018-06-25: 30 mL via ORAL

## 2018-06-25 MED ORDER — OXYCODONE HCL 5 MG PO TABS: 5.0000 mg | ORAL_TABLET | ORAL | Status: DC | PRN

## 2018-06-25 MED ORDER — PRENATAL MULTIVITAMIN CH
1.0000 | ORAL_TABLET | Freq: Every day | ORAL | Status: DC
  Administered 2018-06-25 – 2018-06-28 (×4): 1 via ORAL
  Filled 2018-06-25 (×4): qty 1

## 2018-06-25 MED ORDER — OXYTOCIN 40 UNITS IN LACTATED RINGERS INFUSION - SIMPLE MED
2.5000 [IU]/h | INTRAVENOUS | Status: DC
  Filled 2018-06-25: qty 1000

## 2018-06-25 MED ORDER — BUPIVACAINE IN DEXTROSE 0.75-8.25 % IT SOLN
INTRATHECAL | Status: DC | PRN
  Administered 2018-06-25: 1.6 mL via INTRATHECAL

## 2018-06-25 MED ORDER — EPHEDRINE SULFATE 50 MG/ML IJ SOLN
INTRAMUSCULAR | Status: AC
  Filled 2018-06-25: qty 1

## 2018-06-25 MED ORDER — OXYTOCIN 40 UNITS IN LACTATED RINGERS INFUSION - SIMPLE MED
INTRAVENOUS | Status: AC
  Filled 2018-06-25: qty 1000

## 2018-06-25 MED ORDER — LACTATED RINGERS IV SOLN: INTRAVENOUS | Status: DC

## 2018-06-25 MED ORDER — OXYTOCIN 40 UNITS IN LACTATED RINGERS INFUSION - SIMPLE MED
INTRAVENOUS | Status: DC | PRN
  Administered 2018-06-25: 500 mL via INTRAVENOUS

## 2018-06-25 MED ORDER — OXYCODONE HCL 5 MG PO TABS: 10.0000 mg | ORAL_TABLET | ORAL | Status: DC | PRN

## 2018-06-25 MED ORDER — ACETAMINOPHEN 325 MG PO TABS: 650.0000 mg | ORAL_TABLET | Freq: Four times a day (QID) | ORAL | Status: DC

## 2018-06-25 MED ORDER — LACTATED RINGERS IV SOLN
INTRAVENOUS | Status: DC
  Administered 2018-06-25 (×2): via INTRAVENOUS

## 2018-06-25 MED ORDER — DIPHENHYDRAMINE HCL 25 MG PO CAPS
25.0000 mg | ORAL_CAPSULE | ORAL | Status: DC | PRN
  Administered 2018-06-25: 25 mg via ORAL

## 2018-06-25 MED ORDER — ACETAMINOPHEN 650 MG RE SUPP
650.0000 mg | Freq: Once | RECTAL | Status: AC
  Administered 2018-06-25: 650 mg via RECTAL
  Filled 2018-06-25: qty 1

## 2018-06-25 MED ORDER — DIPHENHYDRAMINE HCL 50 MG/ML IJ SOLN
INTRAMUSCULAR | Status: AC
  Filled 2018-06-25: qty 1

## 2018-06-25 MED ORDER — SODIUM CHLORIDE 0.9 % IV SOLN
INTRAVENOUS | Status: DC | PRN
  Administered 2018-06-25: 25 ug/min via INTRAVENOUS

## 2018-06-25 MED ORDER — EPHEDRINE SULFATE 50 MG/ML IJ SOLN
INTRAMUSCULAR | Status: DC | PRN
  Administered 2018-06-25: 5 mg via INTRAVENOUS

## 2018-06-25 MED ORDER — MEPERIDINE HCL 25 MG/ML IJ SOLN: 6.2500 mg | INTRAMUSCULAR | Status: DC | PRN

## 2018-06-25 MED ORDER — FENTANYL CITRATE (PF) 100 MCG/2ML IJ SOLN
INTRAMUSCULAR | Status: DC | PRN
  Administered 2018-06-25: 15 ug via INTRATHECAL

## 2018-06-25 MED ORDER — DIBUCAINE 1 % RE OINT: 1.0000 "application " | TOPICAL_OINTMENT | RECTAL | Status: DC | PRN

## 2018-06-25 MED ORDER — BUPIVACAINE HCL (PF) 0.5 % IJ SOLN: 30.0000 mL | Freq: Once | INTRAMUSCULAR | Status: DC

## 2018-06-25 MED ORDER — OXYCODONE HCL 5 MG PO TABS
10.0000 mg | ORAL_TABLET | ORAL | Status: DC | PRN
  Administered 2018-06-26 – 2018-06-28 (×10): 10 mg via ORAL
  Filled 2018-06-25 (×11): qty 2

## 2018-06-25 MED ORDER — KETOROLAC TROMETHAMINE 30 MG/ML IJ SOLN: 30.0000 mg | Freq: Four times a day (QID) | INTRAMUSCULAR | Status: AC

## 2018-06-25 MED ORDER — SODIUM CHLORIDE 0.9 % IV SOLN
INTRAVENOUS | Status: DC | PRN
  Administered 2018-06-25: 70 mL

## 2018-06-25 MED ORDER — OXYTOCIN 40 UNITS IN LACTATED RINGERS INFUSION - SIMPLE MED
INTRAVENOUS | Status: AC
  Administered 2018-06-25: 16:00:00
  Filled 2018-06-25: qty 1000

## 2018-06-25 MED ORDER — BUPIVACAINE LIPOSOME 1.3 % IJ SUSP
20.0000 mL | Freq: Once | INTRAMUSCULAR | Status: DC
  Filled 2018-06-25: qty 20

## 2018-06-25 MED ORDER — ACETAMINOPHEN 500 MG PO TABS
1000.0000 mg | ORAL_TABLET | Freq: Four times a day (QID) | ORAL | Status: DC
  Administered 2018-06-25 – 2018-06-28 (×11): 1000 mg via ORAL
  Filled 2018-06-25 (×12): qty 2

## 2018-06-25 MED ORDER — BUPIVACAINE HCL (PF) 0.5 % IJ SOLN
INTRAMUSCULAR | Status: DC | PRN
  Administered 2018-06-25: 30 mL

## 2018-06-25 MED ORDER — COCONUT OIL OIL: 1.0000 "application " | TOPICAL_OIL | Status: DC | PRN

## 2018-06-25 MED ORDER — CEFAZOLIN SODIUM-DEXTROSE 2-4 GM/100ML-% IV SOLN
2.0000 g | Freq: Three times a day (TID) | INTRAVENOUS | Status: DC
  Administered 2018-06-25: 2 g via INTRAVENOUS
  Filled 2018-06-25 (×3): qty 100

## 2018-06-25 SURGICAL SUPPLY — 34 items
ADH SKN CLS APL DERMABOND .7 (GAUZE/BANDAGES/DRESSINGS) ×2
CANISTER SUCT 3000ML PPV (MISCELLANEOUS) ×2 IMPLANT
COVER WAND RF STERILE (DRAPES) ×2 IMPLANT
DERMABOND ADVANCED (GAUZE/BANDAGES/DRESSINGS) ×2
DERMABOND ADVANCED .7 DNX12 (GAUZE/BANDAGES/DRESSINGS) ×2 IMPLANT
DRSG TELFA 3X8 NADH (GAUZE/BANDAGES/DRESSINGS) ×2 IMPLANT
ELECT CAUTERY BLADE 6.4 (BLADE) ×2 IMPLANT
ELECT REM PT RETURN 9FT ADLT (ELECTROSURGICAL) ×2
ELECTRODE REM PT RTRN 9FT ADLT (ELECTROSURGICAL) ×1 IMPLANT
EXTRACTOR VACUUM KIWI (MISCELLANEOUS) ×2 IMPLANT
GAUZE SPONGE 4X4 12PLY STRL (GAUZE/BANDAGES/DRESSINGS) ×2 IMPLANT
GLOVE PI ORTHOPRO 6.5 (GLOVE) ×2
GLOVE PI ORTHOPRO STRL 6.5 (GLOVE) ×2 IMPLANT
GLOVE SURG SYN 6.5 ES PF (GLOVE) ×10 IMPLANT
GOWN STRL REUS W/ TWL LRG LVL3 (GOWN DISPOSABLE) ×4 IMPLANT
GOWN STRL REUS W/TWL LRG LVL3 (GOWN DISPOSABLE) ×4
NS IRRIG 1000ML POUR BTL (IV SOLUTION) ×2 IMPLANT
PACK C SECTION AR (MISCELLANEOUS) ×2 IMPLANT
PAD OB MATERNITY 4.3X12.25 (PERSONAL CARE ITEMS) ×2 IMPLANT
PAD PREP 24X41 OB/GYN DISP (PERSONAL CARE ITEMS) ×2 IMPLANT
SPONGE LAP 18X18 RF (DISPOSABLE) ×2 IMPLANT
STAPLER INSORB 30 2030 C-SECTI (MISCELLANEOUS) ×2 IMPLANT
STRAP SAFETY 5IN WIDE (MISCELLANEOUS) ×2 IMPLANT
STRIP CLOSURE SKIN 1/2X4 (GAUZE/BANDAGES/DRESSINGS) ×2 IMPLANT
SUT MNCRL 4-0 (SUTURE) ×2
SUT MNCRL 4-0 27XMFL (SUTURE) ×1
SUT PDS AB 1 TP1 96 (SUTURE) ×2 IMPLANT
SUT VIC AB 0 CT1 36 (SUTURE) ×4 IMPLANT
SUT VIC AB 2-0 CT1 27 (SUTURE) ×2
SUT VIC AB 2-0 CT1 TAPERPNT 27 (SUTURE) ×1 IMPLANT
SUT VIC AB 3-0 SH 27 (SUTURE) ×2
SUT VIC AB 3-0 SH 27X BRD (SUTURE) ×1 IMPLANT
SUTURE MNCRL 4-0 27XMF (SUTURE) ×1 IMPLANT
SWABSTK COMLB BENZOIN TINCTURE (MISCELLANEOUS) ×2 IMPLANT

## 2018-06-25 NOTE — Op Note (Addendum)
Cesarean Section Procedure Note  06/25/2018   Patient:  Kimberly Knapp  32 y.o. female at [redacted]w[redacted]d.  Patient's last menstrual period was 09/15/2017. Preoperative diagnosis:  Unstable fetal lie, gestational hypertension, prior cesarean delivery Postoperative diagnosis: Unstable fetal lie, gestational hypertension, prior cesarean delivery  PROCEDURE:  Procedure(s): CESAREAN SECTION, repeat (N/A) with Vacuum assist  Surgeon:  Surgeon(s) and Role:    * [name], Elenora Fender, MD - Primary Assist: Milon Score, CNM Anesthesia:  spinal I/O: Total I/O In: 400 [I.V.:400] Out: 728 [Urine:150; Blood:778] Specimens:  Cord Blood Complications: None Apparent Disposition:  VS stable to PACU  Findings: heart-shaped/arcuate uterus, tubes and ovaries bilaterally Live born female, double loose nuchal, arm cord loose Birth Weight: 7 lb 5.1 oz (3320 g) APGAR: 9, 9  Newborn Delivery   Birth date/time:  06/25/2018 09:07:00 Delivery type:  C-Section, Vacuum Assisted Trial of labor:  No C-section categorization:  Repeat      Indication for procedure: 32 y.o. female at [redacted]w[redacted]d with gestational hypertension, previously breech and unstable lie, and previous cesarean delivery.  Procedure Details   The risks, benefits, complications, treatment options, and expected outcomes were discussed with the patient. Informed consent was obtained. The patient was taken to Operating Room, identified as HELYN SCHWAN and the procedure verified as a cesarean delivery.   After administration of anesthesia, the patient was prepped and draped in the usual sterile manner, including a vaginal prep. A surgical time out was performed, with the pediatric team present. After confirming adequate anesthesia, a Pfannenstiel incision was made and carried down through the subcutaneous tissue to the fascia. Fascial incision was made and extended transversely. The fascia was separated from the underlying rectus tissue superiorly and  inferiorly. The rectus muscles were divided in the midline. The peritoneum was identified and entered. Peritoneal incision was extended longitudinally.  A low transverse uterine incision was made. With fundal pressure, the head was unable to be delivered through the hysterotomy.  A flat Kiwi vacuum was applied at the vertex and gentle traction facilitated the delivery of the fetal head.  Delivered from oblique, cephalic presentation was a live born female. Delayed cord clamping was performed for >60 seconds, during which we sang happy birthday to [place]. The umbilical cord was doubly clamped and cut, and the baby was handed off to the awaitng pediatrician.  Cord blood was obtained for evaluation. The placenta was removed intact and appeared normal. The uterus was delivered from the abdominal cavity and cleared of clots, membranes, and debris. The uterus, tubes and ovaries appeared normal, with the uterus having a subtle heart-shape to its fundus. The uterine incision was closed with running locking sutures of 0 Vicryl, and then a second, imbricating stitch was placed. Hemostasis was observed. The abdominal cavity was evacuated of extraneous fluid. The uterus was returned to the abdominal cavity and again the incision was inspected for hemostasis, which was confirmed.  The paracolic gutters were cleaned. The fascia was then reapproximated with running suture of vicryl. 80cc of Long- and short-acting bupivicaine was injected circumferentially into the fascia.  After a change of gloves, the subcutaneous tissue was irrigated and reapproximated with 3-0 vicryl. The skin was closed with absorbable staples, and 20cc of long- and short-acting bupivacaine injected into the skin and subcutaneous tissues.  The incision was covered with surgical glue. An abdominal binder was applied.   Instrument, sponge, and needle counts were correct prior the abdominal closure and at the conclusion of the case.   I was  present and performed  this procedure in its entirety.  VTE: SCDs Perioperative antibiotics: Ancef 2g  ----- Ranae Plumber, MD Attending Obstetrician and Gynecologist [facility], Department of OB/GYN [facility]

## 2018-06-25 NOTE — Transfer of Care (Signed)
Immediate Anesthesia Transfer of Care Note  Patient: Kimberly Knapp  Procedure(s) Performed: CESAREAN SECTION, repeat (N/A Abdomen)  Patient Location: PACU  Anesthesia Type:Spinal  Level of Consciousness: awake, alert  and oriented  Airway & Oxygen Therapy: Patient Spontanous Breathing  Post-op Assessment: Report given to RN and Post -op Vital signs reviewed and stable  Post vital signs: Reviewed and stable  Last Vitals:  Vitals Value Taken Time  BP 149/101 06/25/2018 10:02 AM  Temp 36.7 C 06/25/2018 10:02 AM  Pulse 85 06/25/2018 10:02 AM  Resp 18 06/25/2018 10:02 AM  SpO2 100 % 06/25/2018 10:02 AM    Last Pain:  Vitals:   06/25/18 1002  TempSrc: Oral  PainSc:          Complications: No apparent anesthesia complications

## 2018-06-25 NOTE — Anesthesia Procedure Notes (Signed)
Spinal  Patient location during procedure: OR Start time: 06/25/2018 8:30 AM End time: 06/25/2018 8:36 AM Staffing Anesthesiologist: Emmie Niemann, MD Resident/CRNA: Johnna Acosta, CRNA Performed: resident/CRNA  Preanesthetic Checklist Completed: patient identified, site marked, surgical consent, pre-op evaluation, timeout performed, IV checked, risks and benefits discussed and monitors and equipment checked Spinal Block Patient position: sitting Prep: ChloraPrep Patient monitoring: heart rate, continuous pulse ox and blood pressure Approach: midline Location: L4-5 Injection technique: single-shot Needle Needle type: Introducer and Pencil-Tip  Needle gauge: 24 G Needle length: 9 cm Additional Notes Negative paresthesia. Negative blood return. Positive free-flowing CSF. Expiration date of kit checked and confirmed. Patient tolerated procedure well, without complications.

## 2018-06-25 NOTE — Discharge Summary (Signed)
Obstetrical Discharge Summary  Patient Name: Kimberly Knapp DOB: [birth year] MRN: [id number]  Date of Admission: 06/25/2018 Date of Delivery: 06/25/18 Delivered by: Ranae Plumber, MD Date of Discharge: 06/25/2018  Primary OB: [facility]   YQM:VHQIONG'E last menstrual period was 09/15/2017. EDC Estimated Date of Delivery: 07/16/18 Gestational Age at Delivery: [redacted]w[redacted]d   Antepartum complications:  1. Breech 2. Previous cesarean for breech, desires TOLAC 3. Gestational hypertension developed in last 2 weeks 4. Obesity 5. 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 6. Rubella equivocal Admitting Diagnosis:  Unstable fetal lie, previous cesarean, gestational hypertension, term IUP Secondary Diagnosis: Patient Active Problem List   Diagnosis Date Noted  . Labor and delivery indication for care or intervention 06/08/2018  . Elevated blood pressure affecting pregnancy in third trimester, antepartum 05/31/2018  . ADD (attention deficit disorder) 09/26/2017  . Supervision of high-risk pregnancy 05/28/2015  . Gestational hypertension w/o significant proteinuria in 3rd trimester 05/28/2015  . Herpes genitalis     Augmentation: n/a Complications: None Intrapartum complications/course: patient presented for scheduled cesarean at 37 weeks due to gestational hypertension and previous cesarean, as well as breech.  On presentation fetal head was oblique but down toward pelvis.  She elected to continue with cesarean.  Date of Delivery: 06/25/18 Delivered By: Leeroy Bock [name] Delivery Type: repeat cesarean section, low transverse incision vacuum assisted Anesthesia: spinal Placenta: expressed Laceration: n/a Episiotomy: n/a Newborn Data: Live born female "Bo" Birth Weight: 7 lb 5.1 oz (3320 g) APGAR: 9, 9  Newborn Delivery   Birth date/time:  06/25/2018 09:07:00 Delivery type:  C-Section, Vacuum Assisted Trial of labor:  No C-section categorization:  Repeat     Postpartum Procedures: Anesthesia follow up for evaluation of back pain in lumbar area, plan for Gabapentin Rx and Percocet for postop pain. PT has evaluated pt and able to ambulate without difficulty.   Post partum course Patient had an uncomplicated postpartum course.  By time of discharge on POD#3, her pain was controlled on oral pain medications; she had appropriate lochia and was ambulating, voiding without difficulty, tolerating regular diet and passing flatus.   She was deemed stable for discharge to home.    Discharge Physical Exam: BP 109/79   Pulse 76   Temp 98 F (36.7 C) (Oral)   Resp 18   Ht 5' 7.5" (1.715 m)   Wt 117.8 kg   LMP 09/15/2017   SpO2 100%   Breastfeeding? Unknown   BMI 40.07 kg/m   General: NAD CV: RRR Pulm: CTABL, nl effort ABD: s/nd/nt, fundus firm and below the umbilicus Lochia: moderate Incision: c/d/i, covered in surgical glue  DVT Evaluation: LE non-ttp, no evidence of DVT on exam. Observed pt ambulate: NO weakness or disability of Rt foot or leg noted, no foot drop, no sensory deprivation, no temperature concerns, no nerve tingling or radiculopathy of the lower extrems or buttocks noted. Pt has +paravertebral spinal spasm of Lt side.  Hemoglobin  Date Value Ref Range Status  06/25/2018 11.4 (L) 12.0 - 15.0 g/dL Final  95/28/4132 44.0 11.1 - 15.9 g/dL Final   HCT  Date Value Ref Range Status  06/25/2018 33.4 (L) 36.0 - 46.0 % Final   Hematocrit  Date Value Ref Range Status  09/26/2017 39.8 34.0 - 46.6 % Final   Disposition: stable, discharge to home. Baby Feeding: breastmilk  Baby Disposition: home with mom  Rh Immune globulin given:  n/a Rubella vaccine given: yes Tdap vaccine given in AP or PP setting:  AP Flu vaccine given in AP or PP setting: offered PP  Contraception: Mirena IUD  Prenatal Labs:  Blood type/Rh O+  Antibody screen neg  Rubella equivocal  Varicella Immune  RPR NR  HBsAg Neg  HIV NR  GC neg  Chlamydia neg   Genetic screening declined  1 hour GTT 116  3 hour GTT ---  GBS negative     Plan:  Kimberly Knapp was discharged to home in good condition. Follow-up appointment with Dr. Elesa Massed in 2 weeks for a postop check.  Discharge Medications: Gabapentin 300 mg po tid, 600 mg po at hs, Percocet Rx for pain, Fe for anemia   Signed: Myrtie Cruise, MSN, CNM, FNP Certified Nurse Midwife [facility]

## 2018-06-25 NOTE — H&P (Signed)
OB History & Physical   History of Present Illness:  Chief Complaint:   HPI:  Kimberly Knapp is a 32 y.o. [id number] female at [redacted]w[redacted]d dated by Patient's last menstrual period was 09/15/2017. c/w early ultrasound Estimated Date of Delivery: 07/16/18   She presents to L&D for scheduled cesarean due to breech, gestational hypertension, and history of prior cesarean.  Patient says she thinks the baby flipped as she felt pelvic pressure.  +FM, no CTX, no LOF, no VB  Pregnancy Issues: 1. Breech 2. Previous cesarean for breech, desires TOLAC 3. Gestational hypertension developed in last 2 weeks 4. Obesity 5. 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 6. Rubella equivocal  Maternal Medical History:   Past Medical History:  Diagnosis Date  . ADHD (attention deficit hyperactivity disorder)   . Depression   . Goiter   . Herpes genitalis   . Obesity (BMI 30.0-34.9)   . Pregnancy induced hypertension     Past Surgical History:  Procedure Laterality Date  . BREAST ENHANCEMENT SURGERY Bilateral   . CESAREAN SECTION N/A 05/29/2015   Procedure: CESAREAN SECTION;  Surgeon: [name], MD;  Location: [facility] ORS;  Service: Obstetrics;  Laterality: N/A;  . DILATION AND CURETTAGE OF UTERUS    . THYROID SURGERY     thyroid biopsy  . WISDOM TOOTH EXTRACTION      Allergies  Allergen Reactions  . Iodine Swelling  . Shrimp [Shellfish Allergy] Swelling    Prior to Admission medications   Medication Sig Start Date End Date Taking? Authorizing Provider  acetaminophen (TYLENOL) 325 MG tablet Take 650 mg by mouth every 6 (six) hours as needed (for pain).   Yes [provider]  aspirin EC 81 MG tablet Take 81 mg by mouth at bedtime. 04/06/18  Yes [provider]  calcium carbonate (TUMS - DOSED IN MG ELEMENTAL CALCIUM) 500 MG chewable tablet Chew 2 tablets by mouth every 2 (two) hours as needed for indigestion or heartburn.   Yes [provider]   Prenatal Vit-Fe Fumarate-FA (MULTIVITAMIN-PRENATAL) 27-0.8 MG TABS tablet Take 1 tablet by mouth at bedtime.    Yes [provider]  valACYclovir (VALTREX) 500 MG tablet Take 500 mg by mouth 2 (two) times daily. 06/04/18  Yes [provider]  escitalopram (LEXAPRO) 5 MG tablet Take 1 tablet (5 mg total) by mouth daily. Patient not taking: Reported on 05/31/2018 10/13/17   Jomarie Longs, MD  traZODone (DESYREL) 50 MG tablet Take 0.5-1 tablets (25-50 mg total) by mouth at bedtime as needed for sleep. Patient not taking: Reported on 05/31/2018 10/13/17   Jomarie Longs, MD     Prenatal care site: [facility]    Social History: She  reports that she quit smoking about 8 months ago. Her smoking use included cigarettes. She has never used smokeless tobacco. She reports that she does not drink alcohol or use drugs.  Family History: family history includes ADD / ADHD in her father; Alcohol abuse in her maternal grandfather; Diabetes type II in her unknown relative; Heart disease in her unknown relative; Lung cancer in her unknown relative and unknown relative.   Review of Systems: A full review of systems was performed and negative except as noted in the HPI.     Physical Exam:  Vital Signs: BP 113/80 (BP Location: Left Arm)   Pulse (!) 101   Temp 98 F (36.7 C) (Oral)   Resp 16   Ht 5' 7.5" (1.715 m)  Wt 117.8 kg   LMP 09/15/2017   BMI 40.07 kg/m  General: no acute distress.  HEENT: normocephalic, atraumatic Heart: regular rate & rhythm.  No murmurs/rubs/gallops Lungs: clear to auscultation bilaterally, normal respiratory effort Abdomen: soft, gravid, non-tender;  EFW: 6.12 Pelvic:   External: Normal external female genitalia  Cervix: Dilation: Fingertip / Effacement (%): Thick /      Extremities: non-tender, symmetric, mild edema bilaterally.  DTRs: 2+  Neurologic: Alert & oriented x 3.    Results for orders placed or performed during the hospital  encounter of 06/25/18 (from the past 24 hour(s))  Type and screen [facility]     Status: None   Collection Time: 06/25/18  6:22 AM  Result Value Ref Range   ABO/RH(D) O POS    Antibody Screen NEG    Sample Expiration      06/28/2018 Performed at [facility], [address] [place], [place] [address]   CBC with Differential/Platelet     Status: Abnormal   Collection Time: 06/25/18  6:27 AM  Result Value Ref Range   WBC 8.3 4.0 - 10.5 K/uL   RBC 3.66 (L) 3.87 - 5.11 MIL/uL   Hemoglobin 11.4 (L) 12.0 - 15.0 g/dL   HCT 95.2 (L) 84.1 - 32.4 %   MCV 91.3 80.0 - 100.0 fL   MCH 31.1 26.0 - 34.0 pg   MCHC 34.1 30.0 - 36.0 g/dL   RDW 40.1 02.7 - 25.3 %   Platelets 227 150 - 400 K/uL   nRBC 0.0 0.0 - 0.2 %   Neutrophils Relative % 58 %   Neutro Abs 4.9 1.7 - 7.7 K/uL   Lymphocytes Relative 31 %   Lymphs Abs 2.5 0.7 - 4.0 K/uL   Monocytes Relative 9 %   Monocytes Absolute 0.7 0.1 - 1.0 K/uL   Eosinophils Relative 1 %   Eosinophils Absolute 0.1 0.0 - 0.5 K/uL   Basophils Relative 0 %   Basophils Absolute 0.0 0.0 - 0.1 K/uL   Immature Granulocytes 1 %   Abs Immature Granulocytes 0.08 (H) 0.00 - 0.07 K/uL    Pertinent Results:  Prenatal Labs: Blood type/Rh O+  Antibody screen neg  Rubella equivocal  Varicella Immune  RPR NR  HBsAg Neg  HIV NR  GC neg  Chlamydia neg  Genetic screening declined  1 hour GTT 116  3 hour GTT ---  GBS negative   FHT: 135 mod, +accels, no decel TOCO: none SVE:  Dilation: Fingertip / Effacement (%): Thick /      Ultrasound shows head down, not engaged, slightly oblique  No results found.  Assessment:  Kimberly Knapp is a 32 y.o. [id number] female at [redacted]w[redacted]d with gestational hypertension.   Plan:  1. Admit to Labor & Delivery 2. CBC, T&S, NPO, IVF 3. GBS  neg 4. Consents obtained. 5. Continuous efm/toco until OR 6. Discussed options with patient and family regarding delivery/induction. Since she has had a  previous cesarean we cannot use exogenous PGE. Her options:  1. Go home and come back at designated time to see if her cervix changes.  2. Cook cathether with discharge and soon follow up to attempt cervical ripening as outpatient.  3. Cesarean.    Fetal head is down but not engaged.  Slightly oblique.  High risk of change of position in next few days.  They decided after time to discuss to proceed with scheduled cesarean.  To OR when ready.  ----- Leeroy Bock  [name], MD Attending Obstetrician and Gynecologist [facility], Department of OB/GYN [facility]

## 2018-06-25 NOTE — Anesthesia Procedure Notes (Signed)
Date/Time: 06/25/2018 8:30 AM Performed by: Ginger Carne, CRNA Pre-anesthesia Checklist: Patient identified, Emergency Drugs available, Suction available, Patient being monitored and Timeout performed Patient Re-evaluated:Patient Re-evaluated prior to induction Oxygen Delivery Method: Nasal cannula Preoxygenation: Pre-oxygenation with 100% oxygen

## 2018-06-25 NOTE — Lactation Note (Addendum)
This note was copied from a baby's chart. Lactation Consultation Note  Patient Name: Kimberly Knapp [id number] Date: 06/25/2018 Reason for consult: Initial assessment;Breast augmentation;Early term 37-38.6wks;1st time breastfeeding;Mother's request Assisted mom with first breast feeding after Cesarean Section skin to skin on left breast in biological cross cradle hold.  Mom had breast augmentation with removal and reinsertion of areola and nipple.  Breast tissue and areola are easily compressible.  Demonstrated hand expression of lots of colostrum easily.  Beau was opening his mouth wide once he was eased near the breast.  He latched with minimal assistance and began good rhythmic sucking with occasional swallow.  When finished breast feeding, encouraged mom to hand express some colostrum and rub on nipples.  Mom could feel strong tug at the breast.  Mom reports getting engorged with other 36 and 34 year old girls.  When she had a hard time getting milk to let down, she started supplementing with bottles of formula which decreased milk supply.  Mom really wants to breast feed with this baby.  Reviewed supply and demand, normal course of lactation and routine newborn feeding patterns.    Maternal Data Formula Feeding for Exclusion: No Has patient been taught Hand Expression?: Yes Does the patient have breastfeeding experience prior to this delivery?: Yes  Feeding Feeding Type: Breast Fed  LATCH Score Latch: Repeated attempts needed to sustain latch, nipple held in mouth throughout feeding, stimulation needed to elicit sucking reflex.  Audible Swallowing: A few with stimulation  Type of Nipple: Everted at rest and after stimulation(Easily compressible)  Comfort (Breast/Nipple): Soft / non-tender  Hold (Positioning): Assistance needed to correctly position infant at breast and maintain latch.  LATCH Score: 7  Interventions Interventions: Breast feeding basics reviewed;Reverse  pressure;Assisted with latch;Breast compression;Skin to skin;Adjust position;Breast massage;Support pillows;Position options;Hand express  Lactation Tools Discussed/Used WIC Program: Yes(Also has [employer])   Consult Status Consult Status: Follow-up Date: 06/25/18 Follow-up type: Call as needed    Louis Meckel 06/25/2018, 10:55 AM

## 2018-06-25 NOTE — Plan of Care (Signed)
Reviewed plan of care with pt. All questions answered. Will monitor closely. 

## 2018-06-25 NOTE — Anesthesia Preprocedure Evaluation (Signed)
Anesthesia Evaluation  Patient identified by MRN, date of birth, ID band Patient awake    Reviewed: Allergy & Precautions, NPO status , Patient's Chart, lab work & pertinent test results  History of Anesthesia Complications Negative for: history of anesthetic complications  Airway Mallampati: III  TM Distance: >3 FB Neck ROM: Full    Dental no notable dental hx.    Pulmonary neg sleep apnea, neg COPD, former smoker,    breath sounds clear to auscultation- rhonchi (-) wheezing      Cardiovascular Exercise Tolerance: Good hypertension, (-) CAD, (-) Past MI, (-) Cardiac Stents and (-) CABG  Rhythm:Regular Rate:Normal - Systolic murmurs and - Diastolic murmurs    Neuro/Psych PSYCHIATRIC DISORDERS Depression negative neurological ROS     GI/Hepatic negative GI ROS, Neg liver ROS,   Endo/Other  negative endocrine ROSneg diabetes  Renal/GU negative Renal ROS     Musculoskeletal negative musculoskeletal ROS (+)   Abdominal (+) + obese,   Peds  Hematology negative hematology ROS (+)   Anesthesia Other Findings Past Medical History: No date: ADHD (attention deficit hyperactivity disorder) No date: Depression No date: Goiter No date: Herpes genitalis No date: Obesity (BMI 30.0-34.9) No date: Pregnancy induced hypertension   Reproductive/Obstetrics (+) Pregnancy                             Lab Results  Component Value Date   WBC 8.3 06/25/2018   HGB 11.4 (L) 06/25/2018   HCT 33.4 (L) 06/25/2018   MCV 91.3 06/25/2018   PLT 227 06/25/2018    Anesthesia Physical Anesthesia Plan  ASA: II  Anesthesia Plan: Spinal   Post-op Pain Management:    Induction:   PONV Risk Score and Plan: 2 and Ondansetron  Airway Management Planned: Natural Airway  Additional Equipment:   Intra-op Plan:   Post-operative Plan:   Informed Consent: I have reviewed the patients History and Physical,  chart, labs and discussed the procedure including the risks, benefits and alternatives for the proposed anesthesia with the patient or authorized representative who has indicated his/her understanding and acceptance.   Dental advisory given  Plan Discussed with: CRNA and Anesthesiologist  Anesthesia Plan Comments:         Anesthesia Quick Evaluation

## 2018-06-25 NOTE — Anesthesia Post-op Follow-up Note (Signed)
Anesthesia QCDR form completed.        

## 2018-06-25 NOTE — Consult Note (Signed)
Neonatology Note:   Attendance at C-section:    I was asked by Dr. Elesa Massed to attend this repeat C/S at 37 0/7 weeks due to gestational HTN. The mother is a G76P2A3 O pos, Rubella equivocal, and GBS neg with history of HSV, on Valtrex suppression, obesity, previous breech presentation, and recent onset of gestational HTN, on no medications. ROM at delivery, fluid clear. Infant vigorous with good spontaneous cry and tone. Delayed cord clamping was done. Needed only minimal bulb suctioning. Infant had mild subcostal retractions and nasal flaring noted from about 2-10 minutes, but which improved over time. Color very pink, O2 sat 99% in room air. Ap 9/9. Lungs clear to ausc in DR. Infant is able to remain with his mother for skin to skin time under nursing supervision. I instructed his nurse to call me for any further respiratory distress. Transferred to the care of Pediatrician.   Doretha Sou, MD

## 2018-06-26 ENCOUNTER — Encounter: Payer: Self-pay | Admitting: Obstetrics & Gynecology

## 2018-06-26 LAB — CBC
HEMATOCRIT: 27.7 % — AB (ref 36.0–46.0)
Hemoglobin: 9.4 g/dL — ABNORMAL LOW (ref 12.0–15.0)
MCH: 31.2 pg (ref 26.0–34.0)
MCHC: 33.9 g/dL (ref 30.0–36.0)
MCV: 92 fL (ref 80.0–100.0)
PLATELETS: 184 10*3/uL (ref 150–400)
RBC: 3.01 MIL/uL — ABNORMAL LOW (ref 3.87–5.11)
RDW: 13.2 % (ref 11.5–15.5)
WBC: 9.3 10*3/uL (ref 4.0–10.5)
nRBC: 0 % (ref 0.0–0.2)

## 2018-06-26 MED ORDER — IBUPROFEN 600 MG PO TABS
600.0000 mg | ORAL_TABLET | Freq: Four times a day (QID) | ORAL | Status: DC
  Administered 2018-06-26 – 2018-06-28 (×6): 600 mg via ORAL
  Filled 2018-06-26 (×6): qty 1

## 2018-06-26 MED ORDER — FERROUS SULFATE 325 (65 FE) MG PO TABS
325.0000 mg | ORAL_TABLET | Freq: Two times a day (BID) | ORAL | Status: DC
  Administered 2018-06-26 – 2018-06-28 (×2): 325 mg via ORAL
  Filled 2018-06-26 (×6): qty 1

## 2018-06-26 NOTE — Progress Notes (Signed)
Post Op Day 1  Subjective: Doing well, no concerns. Ambulating without difficulty, pain managed with PO meds, tolerating regular diet, and voiding without difficulty.   No fever/chills, chest pain, shortness of breath, nausea/vomiting, or leg pain. No nipple or breast pain.   Objective: BP 114/77 (BP Location: Right Arm)   Pulse 85   Temp 98.3 F (36.8 C) (Oral)   Resp 18   Ht 5' 7.5" (1.715 m)   Wt 117.8 kg   LMP 09/15/2017   SpO2 99%   Breastfeeding? Unknown   BMI 40.07 kg/m    Physical Exam:  General: alert, cooperative, appears stated age and no distress CV: RRR Pulm: nl effort, CTABL Abdomen: soft, non-tender, active bowel sounds Uterine Fundus: firm Incision: healing well, no dehiscence, no significant erythema Lochia: appropriate DVT Evaluation: No evidence of DVT seen on physical exam. No cords or calf tenderness. No significant calf/ankle edema.  Recent Labs    06/25/18 0627 06/26/18 0606  HGB 11.4* 9.4*  HCT 33.4* 27.7*  WBC 8.3 9.3  PLT 227 184    Assessment/Plan: 32 y.o. [id number] postop day # 1  -Continue routine PP care -Lactation consult PRN for breastfeeding -Acute blood loss anemia - hemodynamically stable and asymptomatic; start PO ferrous sulfate BID with stool softeners  -Immunization status: Needs MMR and flu prior to discharge  Disposition: Continue inpatient postpartum care   LOS: 1 day   Lisette Grinder, CNM 06/26/2018, 8:33 AM   ----- Lisette Grinder Certified Nurse Midwife [facility]

## 2018-06-26 NOTE — Anesthesia Postprocedure Evaluation (Signed)
Anesthesia Post Note  Patient: Kimberly Knapp  Procedure(s) Performed: CESAREAN SECTION, repeat (N/A Abdomen)  Patient location during evaluation: L&D Anesthesia Type: Spinal Level of consciousness: oriented and awake and alert Pain management: pain level controlled Vital Signs Assessment: post-procedure vital signs reviewed and stable Respiratory status: spontaneous breathing, respiratory function stable and patient connected to nasal cannula oxygen Cardiovascular status: blood pressure returned to baseline and stable Postop Assessment: no headache, no backache and no apparent nausea or vomiting Anesthetic complications: no     Last Vitals:  Vitals:   06/26/18 0336 06/26/18 0729  BP: 125/73 114/77  Pulse: 80 85  Resp:  18  Temp: (!) 36.4 C 36.8 C  SpO2: 100% 99%    Last Pain:  Vitals:   06/26/18 0729  TempSrc: Oral  PainSc:                  Rica Mast

## 2018-06-26 NOTE — Lactation Note (Signed)
This note was copied from a baby's chart. Lactation Consultation Note  Patient Name: Kimberly Knapp [id number] Date: 06/26/2018 Reason for consult: Initial assessment   Maternal Data    Feeding Feeding Type: Breast Fed  LATCH Score Latch: Grasps breast easily, tongue down, lips flanged, rhythmical sucking.  Audible Swallowing: Spontaneous and intermittent  Type of Nipple: Everted at rest and after stimulation  Comfort (Breast/Nipple): Soft / non-tender  Hold (Positioning): Assistance needed to correctly position infant at breast and maintain latch.  LATCH Score: 9  Interventions Interventions: Assisted with latch;Hand express;Breast compression;Adjust position  Lactation Tools Discussed/Used     Consult Status Consult Status: Follow-up Date: 06/26/18 Follow-up type: In-patient  LC called to room to assist with breastfeeding. Infant is having trouble latching on the right side. Mother states that she had the breast augmentation on the right side. Infant easily latches to the left side. Mother is able to express from the left side. Swallows heard and infant is having sufficient voids and stools.  Kimberly Knapp 06/26/2018, 1:01 PM

## 2018-06-26 NOTE — Progress Notes (Signed)
There is an error in OR reporting of EBL.   EBL from the surgery was 578cc, not 1200cc.  ----- Ranae Plumber, MD Attending Obstetrician and Gynecologist [facility], Department of OB/GYN [facility]

## 2018-06-27 ENCOUNTER — Encounter: Payer: Self-pay | Admitting: Anesthesiology

## 2018-06-27 ENCOUNTER — Inpatient Hospital Stay: Payer: BLUE CROSS/BLUE SHIELD

## 2018-06-27 LAB — CREATININE, SERUM
Creatinine, Ser: 0.5 mg/dL (ref 0.44–1.00)
GFR calc non Af Amer: >60 mL/min (ref >60)

## 2018-06-27 MED ORDER — LIDOCAINE 5 % EX PTCH
2.0000 | MEDICATED_PATCH | CUTANEOUS | Status: DC
  Administered 2018-06-27: 2 via TRANSDERMAL
  Filled 2018-06-27: qty 2

## 2018-06-27 MED ORDER — IOPAMIDOL (ISOVUE-300) INJECTION 61%
100.0000 mL | Freq: Once | INTRAVENOUS | Status: AC | PRN
  Administered 2018-06-27: 100 mL via INTRAVENOUS

## 2018-06-27 MED ORDER — POLYETHYLENE GLYCOL 3350 17 G PO PACK
17.0000 g | PACK | Freq: Every day | ORAL | Status: DC
  Administered 2018-06-27 – 2018-06-28 (×2): 17 g via ORAL
  Filled 2018-06-27 (×2): qty 1

## 2018-06-27 MED ORDER — GABAPENTIN 300 MG PO CAPS
300.0000 mg | ORAL_CAPSULE | Freq: Three times a day (TID) | ORAL | Status: DC
  Administered 2018-06-27 – 2018-06-28 (×5): 300 mg via ORAL
  Filled 2018-06-27 (×5): qty 1

## 2018-06-27 MED ORDER — CYCLOBENZAPRINE HCL 10 MG PO TABS
5.0000 mg | ORAL_TABLET | Freq: Three times a day (TID) | ORAL | Status: DC
  Administered 2018-06-27 – 2018-06-28 (×3): 5 mg via ORAL
  Filled 2018-06-27 (×3): qty 1

## 2018-06-27 MED ORDER — GABAPENTIN 300 MG PO CAPS
600.0000 mg | ORAL_CAPSULE | Freq: Every day | ORAL | Status: DC
  Administered 2018-06-27: 300 mg via ORAL
  Filled 2018-06-27: qty 2

## 2018-06-27 NOTE — Progress Notes (Signed)
Anesthesia notified of pt symptoms...will be up to see patient soon

## 2018-06-27 NOTE — Progress Notes (Signed)
Dr Thomas here to see patient.

## 2018-06-27 NOTE — Care Management (Signed)
Severe back pain. Unable to walk well. No numbness. Difficulty lifting leg. This is more than the  Usual back pain after C-section. Discussed with Dr ward. Will get MRI/CTscan, to rule ouot epidural hematoma etc.

## 2018-06-27 NOTE — Lactation Note (Signed)
This note was copied from a baby's chart. Lactation Consultation Note  Patient Name: Kimberly Knapp [id number] Date: 06/27/2018     Maternal Data    Feeding    LATCH Score                   Interventions    Lactation Tools Discussed/Used     Consult Status  LC to room to check progress of breastfeeding today. Mother is concerned that infant is not getting enough at the breast. LC was in the room for the entire feeding. Infant fed on both breasts for about 30 minutes total but not many swallows were heard. Pre and post weight was completed before and after the feeding. Pre weight was 3034 grams and post weight was 3036 grams. Infant took in 2 mL. Mother is familiar with SNS and used the device with her older children. Plan as told to mother is to allow infant to breastfeed but for no longer than 15-20 minutes and then supplement with formula. She can also use the SNS device during her breastfeeding session. Mother is to pump afterwards. Mother is ok with the plan.    Arlyss Gandy 06/27/2018, 11:35 AM

## 2018-06-27 NOTE — Care Management (Signed)
Severe back pain. Difficulty walking, lifting legs. No hematoma seen on back. No tenderness when I push iin that lumbar area. This is not the usual back pain after c-section. Discussed with Dr. Elesa Massed, will get MRI/CT scan.

## 2018-06-27 NOTE — Plan of Care (Signed)
Vs stable; up ad lib; tolerating regular diet; taking motrin, tylenol and oxycodone for pain control; breastfeeding fairly well; did want to use SNS; RN showed pt and her husband how to use SNS; pt would like to be discharged today; needs flu shot

## 2018-06-28 MED ORDER — IBUPROFEN 600 MG PO TABS: 600.0000 mg | ORAL_TABLET | Freq: Four times a day (QID) | ORAL | 1 refills | Status: DC

## 2018-06-28 MED ORDER — LIDOCAINE 5 % EX PTCH: 2.0000 | MEDICATED_PATCH | CUTANEOUS | 1 refills | Status: DC

## 2018-06-28 MED ORDER — IBUPROFEN 600 MG PO TABS
600.0000 mg | ORAL_TABLET | Freq: Four times a day (QID) | ORAL | Status: DC
  Administered 2018-06-28 (×2): 600 mg via ORAL
  Filled 2018-06-28 (×2): qty 1

## 2018-06-28 MED ORDER — OXYCODONE HCL 5 MG PO TABS: 5.0000 mg | ORAL_TABLET | ORAL | 0 refills | Status: DC | PRN

## 2018-06-28 MED ORDER — FERROUS SULFATE 325 (65 FE) MG PO TABS: 325.0000 mg | ORAL_TABLET | Freq: Two times a day (BID) | ORAL | 1 refills | Status: DC

## 2018-06-28 MED ORDER — GABAPENTIN 300 MG PO CAPS: 300.0000 mg | ORAL_CAPSULE | Freq: Three times a day (TID) | ORAL | 1 refills | Status: DC

## 2018-06-28 MED ORDER — GABAPENTIN 300 MG PO CAPS: 600.0000 mg | ORAL_CAPSULE | Freq: Every day | ORAL | 1 refills | Status: DC

## 2018-06-28 MED ORDER — SENNOSIDES-DOCUSATE SODIUM 8.6-50 MG PO TABS: 2.0000 | ORAL_TABLET | ORAL | 2 refills | Status: DC

## 2018-06-28 MED ORDER — CYCLOBENZAPRINE HCL 5 MG PO TABS: 10.0000 mg | ORAL_TABLET | Freq: Three times a day (TID) | ORAL | 1 refills | Status: DC | PRN

## 2018-06-28 NOTE — Consult Note (Signed)
[facility] Anesthesia Consultation  Kimberly Knapp [id number] DOB: August 04, 1986 DOA: 06/25/2018 PCP: Particia Nearing, PA-C   Requesting physician: Dr. Elesa Massed Date of consultation: 06-28-18 Reason for consultation: Back pain s/p spinal for cesarean section  CHIEF COMPLAINT:  Back pain  HISTORY OF PRESENT ILLNESS: Kimberly Knapp  is a 32 y.o. female with a known history of morbid obesity who had a spinal for cesarean delivery of her baby on Monday morning.  Patient states that she felt great Monday evening and Tuesday morning, but started having severe back pain on Tuesday afternoon.  We were initially consulted at that time and the plan communicated to [place] was that the patient was to receive an imaging study.  CT obtained showed a bulging disc at L4/5 on the right hand side and patient's pain is also located on the right hand side.  Patient expresses concerns about this pain and whether or not it is from her spinal as well as when/if it will get better.  Also, she shared concerns about seeing neurology prior to her discharge.  Pain is much improved with the gabapentin and oxycodone that she is currently taking.  PAST MEDICAL HISTORY:   Past Medical History:  Diagnosis Date  . ADHD (attention deficit hyperactivity disorder)   . Depression   . Goiter   . Herpes genitalis   . Obesity (BMI 30.0-34.9)   . Pregnancy induced hypertension     PAST SURGICAL HISTORY:  Past Surgical History:  Procedure Laterality Date  . BREAST ENHANCEMENT SURGERY Bilateral   . CESAREAN SECTION N/A 05/29/2015   Procedure: CESAREAN SECTION;  Surgeon: [name], MD;  Location: [facility] ORS;  Service: Obstetrics;  Laterality: N/A;  . CESAREAN SECTION N/A 06/25/2018   Procedure: CESAREAN SECTION, repeat;  Surgeon: [name], Elenora Fender, MD;  Location: [facility] ORS;  Service: Obstetrics;  Laterality: N/A;  . DILATION AND CURETTAGE OF UTERUS    . THYROID SURGERY     thyroid  biopsy  . WISDOM TOOTH EXTRACTION      SOCIAL HISTORY:  Social History   Tobacco Use  . Smoking status: Former Smoker    Types: Cigarettes    Last attempt to quit: 10/13/2017    Years since quitting: 0.7  . Smokeless tobacco: Never Used  Substance Use Topics  . Alcohol use: No    Comment: occasional, not during pregnancy    FAMILY HISTORY:  Family History  Problem Relation Age of Onset  . Diabetes type II Unknown   . Lung cancer Unknown   . Heart disease Unknown   . Lung cancer Unknown   . ADD / ADHD Father   . Alcohol abuse Maternal Grandfather     DRUG ALLERGIES:  Allergies  Allergen Reactions  . Iodine Swelling  . Shrimp [Shellfish Allergy] Swelling    REVIEW OF SYSTEMS:   RESPIRATORY: No cough, shortness of breath, wheezing.  CARDIOVASCULAR: No chest pain.  HEMATOLOGY: No anemia, easy bruising or bleeding SKIN: No rash or lesion. NEUROLOGIC: No tingling, numbness, weakness, but positive for pain.  PSYCHIATRY: No anxiety or depression.   MEDICATIONS AT HOME:  Prior to Admission medications   Medication Sig Start Date End Date Taking? Authorizing Provider  acetaminophen (TYLENOL) 325 MG tablet Take 650 mg by mouth every 6 (six) hours as needed (for pain).   Yes [provider]  aspirin EC 81 MG tablet Take 81 mg by mouth at bedtime. 04/06/18  Yes [provider]  calcium carbonate (TUMS -  DOSED IN MG ELEMENTAL CALCIUM) 500 MG chewable tablet Chew 2 tablets by mouth every 2 (two) hours as needed for indigestion or heartburn.   Yes [provider]  Prenatal Vit-Fe Fumarate-FA (MULTIVITAMIN-PRENATAL) 27-0.8 MG TABS tablet Take 1 tablet by mouth at bedtime.    Yes [provider]  valACYclovir (VALTREX) 500 MG tablet Take 500 mg by mouth 2 (two) times daily. 06/04/18  Yes [provider]  cyclobenzaprine (FLEXERIL) 5 MG tablet Take 2 tablets (10 mg total) by mouth 3 (three) times daily as needed for muscle spasms. 06/28/18    Sharee Pimple, CNM  escitalopram (LEXAPRO) 5 MG tablet Take 1 tablet (5 mg total) by mouth daily. Patient not taking: Reported on 05/31/2018 10/13/17   Jomarie Longs, MD  ferrous sulfate 325 (65 FE) MG tablet Take 1 tablet (325 mg total) by mouth 2 (two) times daily with a meal. 06/28/18   Sharee Pimple, CNM  gabapentin (NEURONTIN) 300 MG capsule Take 1 capsule (300 mg total) by mouth 3 (three) times daily. 06/28/18   Sharee Pimple, CNM  gabapentin (NEURONTIN) 300 MG capsule Take 2 capsules (600 mg total) by mouth at bedtime. 06/28/18   Sharee Pimple, CNM  oxyCODONE (OXY IR/ROXICODONE) 5 MG immediate release tablet Take 1 tablet (5 mg total) by mouth every 4 (four) hours as needed (pain scale 4-7). 06/28/18   Sharee Pimple, CNM  traZODone (DESYREL) 50 MG tablet Take 0.5-1 tablets (25-50 mg total) by mouth at bedtime as needed for sleep. Patient not taking: Reported on 05/31/2018 10/13/17   Jomarie Longs, MD      PHYSICAL EXAMINATION:   VITAL SIGNS: Blood pressure 126/69, pulse 85, temperature 36.6 C, temperature source Oral, resp. rate 18, height 5' 7.5" (1.715 m), weight 117.8 kg, last menstrual period 09/15/2017, SpO2 99 %, unknown if currently breastfeeding.  GENERAL:  32 y.o.-year-old morbidly obese patient in no acute distress.  HEENT: Head atraumatic, normocephalic. LUNGS: Breathing comfortably, no use of accessory muscles.  NEUROLOGIC: No weakness noted, patient able to ambulate with pain PSYCHIATRIC: The patient is alert and oriented x 3.  SKIN: No obvious rash, lesion, or ulcer.    IMPRESSION AND PLAN:   Kimberly Knapp  is a 32 y.o. female presenting with back pain after spinal for cesarean delivery.  We had been told about this patient earlier in the week and were waiting on imaging to rule out epidural hematoma or abscess.  Initially there was significant concern for these, but this is unlikely given that patient still has good strength, no problems with her bowel or  bladder function, no saddle anesthesia, as well as has been afebrile.  Additionally, there is evidence of a bulging disc on her CT scan on the same side as she is experiencing the pain.  It is likely the bulging disc that is causing her discomfort.  Counseled patient regarding symptoms to look for such as saddle anesthesia and loss of bowel bladder function and if these occur to head straight to the closest emergency department.  They had significant concern over epidural hematoma or abscess and I reiterated that if one of those were the cause she would have other symptoms besides pain.  It might be beneficial to have neurology come see her before she leaves or as an outpatient.  Nothing further to do from an anesthesia standpoint.  Thank you for this interesting consult.

## 2018-06-28 NOTE — Lactation Note (Signed)
This note was copied from a baby's chart. Lactation Consultation Note  Patient Name: Kimberly Knapp Date: 06/28/2018 Reason for consult: Follow-up assessment   Maternal Data  Mother with ADHD Depression Goiter HSV BM.30<39 Pregnancy induced hypertension history of breast reduction. HTN and breast reduction may impact her breast feeding. Her breasts are moderately engorged.    Feeding   Currently mother is pumping with the Symphony pump. She is using the SNS or syringe at each feeding and she is latching the baby using a breast shield. We discussed how to clean the pump parts as per the manufacturers instructions. We discussed how to hand express her milk and we reviewed a plan for engorgement.                     Interventions  Breast pumping hand expression using shield. Lactation Tools Discussed/Used  Breast pump, SNS Shield and Syringe   Consult Status   On going    Trudee Grip 06/28/2018, 10:44 AM

## 2018-06-28 NOTE — Discharge Instructions (Signed)
Discharge instructions:   Call office if you have any of the following: headache, visual changes, fever >101.0 F, chills, breast concerns, excessive vaginal bleeding, incision drainage or problems, leg pain or redness, depression or any other concerns.   Activity: Do not lift > 10 lbs for 6 weeks.  No intercourse or tampons for 6 weeks.  No driving for 1-2 weeks.   Check your incision daily for any signs of infection such as redness, warmth, swelling, increased pain, or pus/foul smelling drainage  Call your doctor for increased pain or vaginal bleeding, temperature above 101.0, shortness of breath, depression, or concerns.  No strenuous activity or heavy lifting for 6 weeks.  No intercourse, tampons, douching, or enemas for 6 weeks.  No tub baths-showers only.  No driving for 2 weeks or while taking pain medications.  Continue prenatal vitamin and iron.  Increase calories and fluids while breastfeeding.  You may have a slight fever when your milk comes in, but it should go away on its own.  If it does not, and rises above 101.0 please call the doctor.  For concerns about your baby, please call your pediatrician For breastfeeding concerns, the lactation consultant can be reached at [phone]  Neurology appointment Tuesday

## 2018-06-28 NOTE — Progress Notes (Addendum)
Subjective: Postpartum Day 3 Cesarean Delivery Patient reports still having lower back pain that she describes as a 'severe pain that stays in the region (points to area of lower lumbar), no radiation, no numbness, no sensory or temperature changes, no sciatic sensation. She states that she has to hold on to items to move around since her back hurts. Pt also voiced that at times, partner had to move her Rt lower extrem since she did not feel she could. Able to move now in bed and get OOB and bear weight on Rt LE but, feels the pain in lower back. Narcotic is helping with the pain. Nursing infant well. Wants to go home today if able.   Objective: Vital signs in last 24 hours: Temp:  [98.1 F (36.7 C)-98.6 F (37 C)] 98.5 F (36.9 C) (10/17 0816) Pulse Rate:  [82-136] 82 (10/17 0816) Resp:  [18-20] 20 (10/17 0816) BP: (121-145)/(68-94) 140/71 (10/17 0816) SpO2:  [98 %-100 %] 100 % (10/17 0816)  Physical Exam:  General: A,A&O x3 Lochia: Mod, no clots,  Uterine Fundus: U-2, FF. Incision: C/D/I DVT Evaluation: neg Homans, no edema.   Recent Labs    06/26/18 0606  HGB 9.4*  HCT 27.7*  CT: Musculoskeletal: Small soft disc protrusion at L4-5 to the right seen on image 62 of series 2. Otherwise negative.  IMPRESSION: 1. Postsurgical changes consistent with recent C-section. 2. Small focal disc protrusion at L4-5 to the right of midline which could affect the right L5 nerve.  Assessment/Plan: Status post Cesarean section. PPD#3 2. Lumbar back pain with RLE ROM issues due to a bruise of the epidural nerve (spinal) per anesthesia 3. Small disc protrusion L4-L5 to right of midline which could affect Rt L5 nerve.  4. Obesity P:1. PT to evaluate pt for ROM  2. Will decide if Neuro consult indicated 3. Pt wants to go home and will wait till PT sees pt  At 1545, pt doing well and was seen by Anesthesia and PT and cleared for dc home today. Pt can follow up in 1-2 weeks or call for  worsening of S/S. _______________________________________  Sharee Pimple, CNM 06/28/2018, 8:49 AM

## 2018-06-28 NOTE — Evaluation (Signed)
Physical Therapy Evaluation Patient Details Name: Kimberly Knapp MRN: [id number] DOB: May 04, 1986 Today's Date: 06/28/2018   History of Present Illness  Pt admitted for c-section delivery. History includes ADHD, depression, gestational HTN. Per patient, she was ambulating well post-op, however developed severe back pain yesterday and was unable to ambulate. Of note, imaging demonstrated focal disc protrusion at L4/L5.  Clinical Impression  Pt is a pleasant 32 year old female who was admitted for complications post c-section. Pt performs bed mobility with mod I and transfers/ambulation with supervision without need for AD. Pt demonstrates slight deficits with sensation on L5 dermatome. No focal weakness present.  Able to ambulate in room and around RN station although slow and slightly off her baseline. Reviewed education for low back pain including gentle hamstring stretch and rotation for increased ROM. Also reviewed importance of moving frequently to decrease stiffness and use of heat. Educated if symptoms continue or worsen, recommend OP PT follow up. Pt will be dc in house and does not require follow up from acute care services. RN aware. Will dc current orders.      Follow Up Recommendations Outpatient PT(if needed once cleared for exercise by OB)    Equipment Recommendations  None recommended by PT    Recommendations for Other Services       Precautions / Restrictions Precautions Precautions: None Restrictions Weight Bearing Restrictions: No      Mobility  Bed Mobility Overal bed mobility: Modified Independent             General bed mobility comments: uses railing to sit at EOB. Slow transition noted towards side of bed. Once seated, able to sit with upright posture.  Transfers Overall transfer level: Needs assistance Equipment used: None Transfers: Sit to/from Stand Sit to Stand: Supervision         General transfer comment: safe technique with upright  posture. Reports less pain this date compared to previous date  Ambulation/Gait Ambulation/Gait assistance: Supervision Gait Distance (Feet): 40 Feet Assistive device: None Gait Pattern/deviations: Step-through pattern     General Gait Details: hesitant gait pattern with slow speed at first with guarding noted, however once in hallway, improved gait pattern. Further ambulation noted in ther-ex section.  Stairs            Wheelchair Mobility    Modified Rankin (Stroke Patients Only)       Balance Overall balance assessment: Needs assistance Sitting-balance support: Feet supported Sitting balance-Leahy Scale: Good     Standing balance support: No upper extremity supported Standing balance-Leahy Scale: Good                               Pertinent Vitals/Pain Pain Assessment: Faces Faces Pain Scale: Hurts a little bit Pain Location: R low back Pain Descriptors / Indicators: Discomfort;Dull Pain Intervention(s): Premedicated before session;Limited activity within patient's tolerance    Home Living Family/patient expects to be discharged to:: Private residence Living Arrangements: Spouse/significant other;Children Available Help at Discharge: Family Type of Home: House Home Access: Stairs to enter Entrance Stairs-Rails: Left Entrance Stairs-Number of Steps: 4 Home Layout: Two level;Able to live on main level with bedroom/bathroom Home Equipment: None      Prior Function Level of Independence: Independent         Comments: no AD prior to admission     Hand Dominance        Extremity/Trunk Assessment   Upper Extremity Assessment Upper Extremity Assessment:  Overall WFL for tasks assessed    Lower Extremity Assessment Lower Extremity Assessment: Overall WFL for tasks assessed(decreased sensation in L5 dermatome)       Communication   Communication: No difficulties  Cognition Arousal/Alertness: Awake/alert Behavior During Therapy: WFL  for tasks assessed/performed Overall Cognitive Status: Within Functional Limits for tasks assessed                                        General Comments      Exercises Other Exercises Other Exercises: ambulated lap around RN station without AD. Slow speed noted however with upright posture. Mild pain reported with ambulation. Reciprocal gait pattern noted.   Assessment/Plan    PT Assessment All further PT needs can be met in the next venue of care  PT Problem List Decreased mobility;Pain;Impaired sensation       PT Treatment Interventions      PT Goals (Current goals can be found in the Care Plan section)  Acute Rehab PT Goals Patient Stated Goal: to have less pain PT Goal Formulation: All assessment and education complete, DC therapy Time For Goal Achievement: 06/28/18 Potential to Achieve Goals: Good    Frequency     Barriers to discharge        Co-evaluation               AM-PAC PT "6 Clicks" Daily Activity  Outcome Measure Difficulty turning over in bed (including adjusting bedclothes, sheets and blankets)?: None Difficulty moving from lying on back to sitting on the side of the bed? : A Little Difficulty sitting down on and standing up from a chair with arms (e.g., wheelchair, bedside commode, etc,.)?: None Help needed moving to and from a bed to chair (including a wheelchair)?: None Help needed walking in hospital room?: None Help needed climbing 3-5 steps with a railing? : A Little 6 Click Score: 22    End of Session Equipment Utilized During Treatment: Gait belt Activity Tolerance: Patient tolerated treatment well Patient left: in bed;with family/visitor present Nurse Communication: Mobility status PT Visit Diagnosis: Difficulty in walking, not elsewhere classified (R26.2);Pain Pain - Right/Left: Right Pain - part of body: (back)    Time: 7035-0093 PT Time Calculation (min) (ACUTE ONLY): 34 min   Charges:   PT Evaluation $PT  Eval Low Complexity: 1 Low PT Treatments $Gait Training: 8-22 mins        Greggory Stallion, PT, DPT [phone]   Karime Scheuermann 06/28/2018, 1:42 PM

## 2018-06-28 NOTE — Progress Notes (Signed)
Discharge order received from doctor. MMR given prior to discharge. Incision cleaning kit given. Reviewed discharge instructions and prescriptions with patient and answered all questions. Follow up appointments instructions given. Patient verbalized understanding. ID bands checked. Patient discharged home with infant via wheelchair by nursing/auxillary.    Hilbert Bible, RN

## 2018-06-28 NOTE — Plan of Care (Signed)
Vs stable; tolerating regular diet; can get up on her own; biggest issue/complaint is back pain; had CT done yesterday; Dr. Elesa Massed saw results and discussed them with radiology and anesthesiology; some meds ordered by Dr. Elesa Massed for back pain/issue; pt has flexeril and neurontin as well as motrin, tylenol and oxycodone; pt now has lidoderm patch ordered and is on her back; using heating pad intermittently; breastfeeding and sometimes uses SNS at breast and sometimes does use bottle/slow flow nipple

## 2018-06-28 NOTE — Progress Notes (Signed)
  Subjective:   In tears, saying her back hurts severely, midline whenever she moves her right leg.  Unlike any pain she has had before.  No radiation, no weakness, no loss of motion, no loss of sensory.  Started spontaneously after moving around in the bed once her spinal wore off.  Gradually worsened and has been severe since overnight.   Objective:  Blood pressure 131/74, pulse 86, temperature 98.2 F (36.8 C), temperature source Oral, resp. rate 20, height 5' 7.5" (1.715 m), weight 117.8 kg, last menstrual period 09/15/2017, SpO2 98 %, unknown if currently breastfeeding.  General: NAD Pulmonary: no increased work of breathing Abdomen: non-distended, non-tender, fundus firm at level of umbilicus Incision: c/d/i covered in surgical glue, no erythema, induration, eccymosis Extremities: no edema, no erythema, no tenderness  Results for orders placed or performed during the hospital encounter of 06/25/18 (from the past 24 hour(s))  Creatinine, serum     Status: None   Collection Time: 06/27/18 11:43 AM  Result Value Ref Range   Creatinine, Ser 0.50 0.44 - 1.00 mg/dL   GFR calc non Af Amer >60 >60 mL/min   GFR calc Af Amer >60 >60 mL/min     Assessment:   32 y.o. [id number] postoperativeday # 2 from repeat LTCS and right sided back pain   Plan:   1. Anesthesia saw patient and does not think her pain is from spinal. 2. Ct scan ordered, showed mild herniation of disk L4-L5 which could be a contributing factor. 3. Spoke to two radiologists and one neuro-radiologist over the course of the day and over several hours, and they at this point do not recommend an MRI, as the likelihood of identifying either a bruised nerve, or pathological cause of her current symptoms is very low, and risk/benefit would not be worth the cost at this point.  Should her symptoms not resolve in the next week or so, then as an outpatient this would be warranted.  She should follow up with neruology as an outpatient.    4. Physical therapy  5. Lidocaine patch, ice/heat, gabapentin, flexeril and see how this regimen differs with both her pain and agility tomorrow.  Outpatient neurology appointment made with Dr. Sherryll Burger next week.   ----- Ranae Plumber, MD Attending [facility], Department of OB/GYN [facility]

## 2018-07-04 ENCOUNTER — Other Ambulatory Visit: Payer: Self-pay | Admitting: Neurology

## 2018-07-04 DIAGNOSIS — M545 Low back pain, unspecified: Secondary | ICD-10-CM

## 2018-07-22 ENCOUNTER — Ambulatory Visit
Admission: RE | Admit: 2018-07-22 | Discharge: 2018-07-22 | Disposition: A | Discharge status: Home / Self Care | Payer: BLUE CROSS/BLUE SHIELD | Source: Ambulatory Visit | Attending: Neurology | Admitting: Neurology

## 2018-07-24 ENCOUNTER — Ambulatory Visit: Payer: BLUE CROSS/BLUE SHIELD | Attending: Neurology | Admitting: Physical Therapy

## 2018-07-26 ENCOUNTER — Ambulatory Visit: Payer: BLUE CROSS/BLUE SHIELD | Admitting: Physical Therapy

## 2018-07-31 ENCOUNTER — Ambulatory Visit: Payer: BLUE CROSS/BLUE SHIELD | Admitting: Physical Therapy

## 2018-08-02 ENCOUNTER — Encounter: Payer: BLUE CROSS/BLUE SHIELD | Admitting: Physical Therapy

## 2018-10-15 ENCOUNTER — Encounter: Payer: Self-pay | Admitting: Psychiatry

## 2018-10-15 ENCOUNTER — Other Ambulatory Visit: Payer: Self-pay

## 2018-10-15 ENCOUNTER — Ambulatory Visit: Payer: BLUE CROSS/BLUE SHIELD | Admitting: Psychiatry

## 2018-10-15 VITALS — BP 143/83 | HR 82 | Temp 99.5°F | Wt 269.6 lb

## 2018-10-15 DIAGNOSIS — F411 Generalized anxiety disorder: Secondary | ICD-10-CM

## 2018-10-15 DIAGNOSIS — F900 Attention-deficit hyperactivity disorder, predominantly inattentive type: Secondary | ICD-10-CM

## 2018-10-15 DIAGNOSIS — F330 Major depressive disorder, recurrent, mild: Secondary | ICD-10-CM

## 2018-10-15 MED ORDER — AMPHETAMINE-DEXTROAMPHET ER 20 MG PO CP24: 20.0000 mg | ORAL_CAPSULE | Freq: Every day | ORAL | 0 refills | Status: DC

## 2018-10-15 MED ORDER — ESCITALOPRAM OXALATE 5 MG PO TABS: 5.0000 mg | ORAL_TABLET | Freq: Every day | ORAL | 0 refills | Status: DC

## 2018-10-15 NOTE — Progress Notes (Signed)
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  Patient was last seen in clinic on 10/13/2017.  Patient reports soon after her last visit with writer patient found out she was pregnant.  She reports she hence stopped all her medications.  Her baby is currently 54 months old.  Patient reports she started noticing depressive symptoms when she feels sad, has lack of motivation, low energy, feeling irritable and so on which can last for 2 to 3 days.  She reports this has been getting worse since the birth of her baby.  She hence decided to get back on medications and that is why she is here today.  She reports the last time Lexapro was prescribed she did not give it a fair trial since she had to stop it due to her pregnancy.  She would like to be restarted on it again.  Patient reports she also struggles with anxiety symptoms however she feels her depressive symptoms are more than her anxiety.  She feels anxious more so because of feeling depressed most of the time.  Patient reports she also struggles with ADHD.  She was told recently by her mother that she was diagnosed with ADHD as a child however was never treated.  Patient reports she stopped taking her Adderall which was given to her last visit since she found out about her pregnancy.  She reports she is unable to organize task, keep up with her projects around the house and so on due to her ADHD symptoms.  Patient reports she would like to get back on her medication.  Patient denies any suicidality.  Patient denies any perceptual disturbances.  Patient denies  any homicidality.  Patient reports sleep is fair.  Patient reports social support from her husband and her family.  Patient denies any other concerns today. Visit Diagnosis:    ICD-10-CM   1. MDD (major depressive disorder), recurrent episode, mild (HCC) F33.0 escitalopram (LEXAPRO) 5 MG tablet  2. GAD (generalized anxiety disorder) F41.1   3. Attention deficit hyperactivity disorder (ADHD), predominantly inattentive type F90.0 amphetamine-dextroamphetamine (ADDERALL XR) 20 MG 24 hr capsule    Past Psychiatric History: Reviewed past psychiatric history from my progress note on 10/13/2017.  Past trials of Prozac, Zoloft, Celexa, Wellbutrin, Adderall  Past Medical History:  Past Medical History:  Diagnosis Date  . ADHD (attention deficit hyperactivity disorder)   . Depression   . Goiter   . Herpes genitalis   . Obesity (BMI 30.0-34.9)   . Pregnancy induced hypertension     Past Surgical History:  Procedure Laterality Date  . BREAST ENHANCEMENT SURGERY Bilateral   . CESAREAN SECTION N/A 05/29/2015   Procedure: CESAREAN SECTION;  Surgeon: Elenora Fenderhelsea C [name], MD;  Location: [facility] ORS;  Service: Obstetrics;  Laterality: N/A;  . CESAREAN SECTION N/A 06/25/2018   Procedure: CESAREAN SECTION, repeat;  Surgeon: [name], Elenora Fenderhelsea C, MD;  Location: [facility] ORS;  Service: Obstetrics;  Laterality: N/A;  . DILATION AND CURETTAGE OF UTERUS    . THYROID SURGERY     thyroid  biopsy  . WISDOM TOOTH EXTRACTION      Family Psychiatric History: I have reviewed family psychiatric history from my progress note on 10/13/2017.  Family History:  Family History  Problem Relation Age of Onset  . Diabetes type II Other   . Lung cancer Other   . Heart disease Other   . Lung cancer Other   . ADD / ADHD Father   . Alcohol abuse Maternal Grandfather     Social History: I have reviewed social history from my progress note on 10/13/2017.  Patient has new addition to her family, she recently gave birth 4 months  ago. Social History   Socioeconomic History  . Marital status: Married    Spouse name: michael   . Number of children: 3  . Years of education: Not on file  . Highest education level: Associate degree: occupational, Scientist, product/process development, or vocational program  Occupational History  . 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 Financial resource strain: Not hard at all  . Food insecurity:    Worry: Never true    Inability: Never true  . Transportation needs:    Medical: No    Non-medical: No  Tobacco Use  . Smoking status: Former Smoker    Types: Cigarettes    Last attempt to quit: 10/13/2017    Years since quitting: 1.0  . Smokeless tobacco: Never Used  Substance and Sexual Activity  . Alcohol use: No    Comment: occasional, not during pregnancy  . Drug use: No  . Sexual activity: Yes    Birth control/protection: None  Lifestyle  . Physical activity:    Days per week: 0 days    Minutes per session: 0 min  . Stress: Not on file  Relationships  . Social connections:    Talks on phone: Three times a week    Gets together: Once a week    Attends religious service: More than 4 times per year    Active member of club or organization: No    Attends meetings of clubs or organizations: Never    Relationship status: Married  Other Topics Concern  . Not on file  Social History Narrative  . Not on file    Allergies:  Allergies  Allergen Reactions  . Iodine Swelling  . Shrimp [Shellfish Allergy] Swelling    Metabolic Disorder Labs: No results found for: HGBA1C, MPG No results found for: PROLACTIN No results found for: CHOL, TRIG, HDL, CHOLHDL, VLDL, LDLCALC Lab Results  Component Value Date   TSH 0.423 (L) 09/26/2017    Therapeutic Level Labs: No results found for: LITHIUM No results found for: VALPROATE No components found for:  CBMZ  Current Medications: Current Outpatient Medications  Medication Sig Dispense Refill  .  amphetamine-dextroamphetamine (ADDERALL XR) 20 MG 24 hr capsule Take 1 capsule (20 mg total) by mouth daily. 30 capsule 0  . escitalopram (LEXAPRO) 5 MG tablet Take 1 tablet (5 mg total) by mouth daily. 30 tablet 0   No current facility-administered medications for this visit.      Musculoskeletal: Strength & Muscle Tone: within normal limits Gait & Station: normal Patient leans: N/A  Psychiatric Specialty Exam: Review of Systems  Psychiatric/Behavioral: Positive for depression. The patient is nervous/anxious.   All other systems reviewed and are negative.   Blood pressure (!) 143/83, pulse 82, temperature 99.5 F (37.5 C), temperature source Oral, weight 269 lb 9.6 oz (122.3 kg), not currently breastfeeding.Body mass index  is 41.6 kg/m.  General Appearance: Casual  Eye Contact:  Fair  Speech:  Clear and Coherent  Volume:  Normal  Mood:  Anxious and Depressed  Affect:  Appropriate  Thought Process:  Goal Directed and Descriptions of Associations: Intact  Orientation:  Full (Time, Place, and Person)  Thought Content: Logical   Suicidal Thoughts:  No  Homicidal Thoughts:  No  Memory:  Immediate;   Fair Recent;   Fair Remote;   Fair  Judgement:  Fair  Insight:  Fair  Psychomotor Activity:  Normal  Concentration:  Concentration: Fair and Attention Span: Fair  Recall:  [employer] of Knowledge: Fair  Language: Fair  Akathisia:  No  Handed:  Right  AIMS : Denies tremors, rigidity  Assets:  Communication Skills Desire for Improvement Social Support  ADL's:  Intact  Cognition: WNL  Sleep:  Fair   Screenings:   Assessment and Plan: Amiree is a 33 year old Caucasian female who has a history of depression, anxiety, ADD, presented to the clinic today for a follow-up visit.  Patient continues to struggle with depression, anxiety and ADHD symptoms, is noncompliant on her medications.  Patient was last seen in February 2019 however had to stop all medications due to being  pregnant soon after that.  Patient has a newborn baby ( not breast feeding) and decided to get back on her medications and hence here for a follow-up visit.  Patient has good social support from family.  Patient will benefit from medication readjustment.  Plan MDD- unstable Start Lexapro 5 mg p.o. daily   For anxiety symptoms-unstable Start Lexapro 5 mg p.o. daily  For ADD-unstable Start Adderall extended release 20 mg p.o. daily Reviewed [place] controlled substance and taste the base.  Patient with history of TSH abnormalities-will refer her to her primary medical doctor.  Follow-up in clinic in 2 to 3 weeks or sooner if needed.  I have spent atleast 25 minutes  face to face with patient today. More than 50 % of the time was spent for psychoeducation and supportive psychotherapy and care coordination.  This note was generated in part or whole with voice recognition software. Voice recognition is usually quite accurate but there are transcription errors that can and very often do occur. I apologize for any typographical errors that were not detected and corrected.       Jomarie Longs, MD 10/15/2018, 2:07 PM

## 2018-10-15 NOTE — Patient Instructions (Signed)
Escitalopram tablets What is this medicine? ESCITALOPRAM (es sye TAL oh pram) is used to treat depression and certain types of anxiety. This medicine may be used for other purposes; ask your health care provider or pharmacist if you have questions. COMMON BRAND NAME(S): Lexapro What should I tell my health care provider before I take this medicine? They need to know if you have any of these conditions: -bipolar disorder or a family history of bipolar disorder -diabetes -glaucoma -heart disease -kidney or liver disease -receiving electroconvulsive therapy -seizures (convulsions) -suicidal thoughts, plans, or attempt by you or a family member -an unusual or allergic reaction to escitalopram, the related drug citalopram, other medicines, foods, dyes, or preservatives -pregnant or trying to become pregnant -breast-feeding How should I use this medicine? Take this medicine by mouth with a glass of water. Follow the directions on the prescription label. You can take it with or without food. If it upsets your stomach, take it with food. Take your medicine at regular intervals. Do not take it more often than directed. Do not stop taking this medicine suddenly except upon the advice of your doctor. Stopping this medicine too quickly may cause serious side effects or your condition may worsen. A special MedGuide will be given to you by the pharmacist with each prescription and refill. Be sure to read this information carefully each time. Talk to your pediatrician regarding the use of this medicine in children. Special care may be needed. Overdosage: If you think you have taken too much of this medicine contact a poison control center or emergency room at once. NOTE: This medicine is only for you. Do not share this medicine with others. What if I miss a dose? If you miss a dose, take it as soon as you can. If it is almost time for your next dose, take only that dose. Do not take double or extra  doses. What may interact with this medicine? Do not take this medicine with any of the following medications: -certain medicines for fungal infections like fluconazole, itraconazole, ketoconazole, posaconazole, voriconazole -cisapride -citalopram -dofetilide -dronedarone -linezolid -MAOIs like Carbex, Eldepryl, Marplan, Nardil, and Parnate -methylene blue (injected into a vein) -pimozide -thioridazine -ziprasidone This medicine may also interact with the following medications: -alcohol -amphetamines -aspirin and aspirin-like medicines -carbamazepine -certain medicines for depression, anxiety, or psychotic disturbances -certain medicines for migraine headache like almotriptan, eletriptan, frovatriptan, naratriptan, rizatriptan, sumatriptan, zolmitriptan -certain medicines for sleep -certain medicines that treat or prevent blood clots like warfarin, enoxaparin, dalteparin -cimetidine -diuretics -fentanyl -furazolidone -isoniazid -lithium -metoprolol -NSAIDs, medicines for pain and inflammation, like ibuprofen or naproxen -other medicines that prolong the QT interval (cause an abnormal heart rhythm) -procarbazine -rasagiline -supplements like St. John's wort, kava kava, valerian -tramadol -tryptophan This list may not describe all possible interactions. Give your health care provider a list of all the medicines, herbs, non-prescription drugs, or dietary supplements you use. Also tell them if you smoke, drink alcohol, or use illegal drugs. Some items may interact with your medicine. What should I watch for while using this medicine? Tell your doctor if your symptoms do not get better or if they get worse. Visit your doctor or health care professional for regular checks on your progress. Because it may take several weeks to see the full effects of this medicine, it is important to continue your treatment as prescribed by your doctor. Patients and their families should watch out for  new or worsening thoughts of suicide or depression. Also watch out for   sudden changes in feelings such as feeling anxious, agitated, panicky, irritable, hostile, aggressive, impulsive, severely restless, overly excited and hyperactive, or not being able to sleep. If this happens, especially at the beginning of treatment or after a change in dose, call your health care professional. Bonita Quin may get drowsy or dizzy. Do not drive, use machinery, or do anything that needs mental alertness until you know how this medicine affects you. Do not stand or sit up quickly, especially if you are an older patient. This reduces the risk of dizzy or fainting spells. Alcohol may interfere with the effect of this medicine. Avoid alcoholic drinks. Your mouth may get dry. Chewing sugarless gum or sucking hard candy, and drinking plenty of water may help. Contact your doctor if the problem does not go away or is severe. What side effects may I notice from receiving this medicine? Side effects that you should report to your doctor or health care professional as soon as possible: -allergic reactions like skin rash, itching or hives, swelling of the face, lips, or tongue -anxious -black, tarry stools -changes in vision -confusion -elevated mood, decreased need for sleep, racing thoughts, impulsive behavior -eye pain -fast, irregular heartbeat -feeling faint or lightheaded, falls -feeling agitated, angry, or irritable -hallucination, loss of contact with reality -loss of balance or coordination -loss of memory -painful or prolonged erections -restlessness, pacing, inability to keep still -seizures -stiff muscles -suicidal thoughts or other mood changes -trouble sleeping -unusual bleeding or bruising -unusually weak or tired -vomiting Side effects that usually do not require medical attention (report to your doctor or health care professional if they continue or are bothersome): -changes in appetite -change in sex  drive or performance -headache -increased sweating -indigestion, nausea -tremors This list may not describe all possible side effects. Call your doctor for medical advice about side effects. You may report side effects to FDA at 1-800-FDA-1088. Where should I keep my medicine? Keep out of reach of children. Store at room temperature between 15 and 30 degrees C (59 and 86 degrees F). Throw away any unused medicine after the expiration date. NOTE: This sheet is a summary. It may not cover all possible information. If you have questions about this medicine, talk to your doctor, pharmacist, or health care provider.  2019 Elsevier/Gold Standard (2016-02-01 13:20:23) Amphetamine; Dextroamphetamine extended-release capsules What is this medicine? AMPHETAMINE; DEXTROAMPHETAMINE (am FET a meen; dex troe am FET a meen) is used to treat attention-deficit hyperactivity disorder (ADHD). Federal law prohibits giving this medicine to any person other than the person for whom it was prescribed. Do not share this medicine with anyone else. This medicine may be used for other purposes; ask your health care provider or pharmacist if you have questions. COMMON BRAND NAME(S): Adderall XR, Mydayis What should I tell my health care provider before I take this medicine? They need to know if you have any of these conditions: -anxiety or panic attacks -circulation problems in fingers and toes -glaucoma -hardening or blockages of the arteries or heart blood vessels -heart disease or a heart defect -high blood pressure -history of a drug or alcohol abuse problem -history of stroke -kidney disease -liver disease -mental illness -seizures -suicidal thoughts, plans, or attempt; a previous suicide attempt by you or a family member -thyroid disease -Tourette's syndrome -an unusual or allergic reaction to dextroamphetamine, other amphetamines, other medicines, foods, dyes, or preservatives -pregnant or trying to  get pregnant -breast-feeding How should I use this medicine? Take this medicine by mouth with  a glass of water. Follow the directions on the prescription label. This medicine is taken just one time per day, usually in the morning after waking up. Take with or without food. Do not chew or crush this medicine. You may open the capsules and sprinkle the medicine on a spoonful of applesauce. If sprinkled on applesauce, take the dose immediately and do not crush or chew. Do not take your medicine more often than directed. A special MedGuide will be given to you by the pharmacist with each prescription and refill. Be sure to read this information carefully each time. Talk to your pediatrician regarding the use of this medicine in children. While this drug may be prescribed for children as young as 6 years for selected conditions, precautions do apply. Some extended-release capsules are recommended for use only in children 35 years of age and older. Overdosage: If you think you have taken too much of this medicine contact a poison control center or emergency room at once. NOTE: This medicine is only for you. Do not share this medicine with others. What if I miss a dose? If you miss a dose, take it as soon as you can in the morning, but do not take it later in the day because it can cause trouble sleeping. If it is almost time for your next dose, take only that dose. Do not take double or extra doses. What may interact with this medicine? Do not take this medicine with any of the following medications: -MAOIs like Carbex, Eldepryl, Marplan, Nardil, and Parnate -other stimulant medicines for attention disorders This medicine may also interact with the following medications: -acetazolamide -alcohol -ammonium chloride -antacids -ascorbic acid -atomoxetine -caffeine -certain medicines for blood pressure -certain medicines for depression, anxiety, or psychotic disturbances -certain medicines for seizures  like carbamazepine, phenobarbital, phenytoin -certain medicines for stomach problems like cimetidine, ranitidine, famotidine, esomeprazole, omeprazole, lansoprazole, pantoprazole -lithium -medicines for colds and breathing difficulties -medicines for diabetes -medicines or dietary supplements for weight loss or to stay awake -methenamine -narcotic medicines for pain -quinidine -ritonavir -sodium bicarbonate -St. John's wort This list may not describe all possible interactions. Give your health care provider a list of all the medicines, herbs, non-prescription drugs, or dietary supplements you use. Also tell them if you smoke, drink alcohol, or use illegal drugs. Some items may interact with your medicine. What should I watch for while using this medicine? Visit your doctor or health care professional for regular checks on your progress. This prescription requires that you follow special procedures with your doctor and pharmacy. You will need to have a new written prescription from your doctor every time you need a refill. This medicine may affect your concentration, or hide signs of tiredness. Until you know how this medicine affects you, do not drive, ride a bicycle, use machinery, or do anything that needs mental alertness. Alcohol should be avoided with some brands of this medicine. Talk to your doctor or health care professional if you have questions. Tell your doctor or health care professional if this medicine loses its effects, or if you feel you need to take more than the prescribed amount. Do not change the dosage without talking to your doctor or health care professional. Decreased appetite is a common side effect when starting this medicine. Eating small, frequent meals or snacks can help. Talk to your doctor if you continue to have poor eating habits. Height and weight growth of a child taking this medicine will be monitored closely. Do not take  this medicine close to bedtime. It may  prevent you from sleeping. If you are going to need surgery, an MRI, a CT scan, or other procedure, tell your doctor that you are taking this medicine. You may need to stop taking this medicine before the procedure. Tell your doctor or healthcare professional right away if you notice unexplained wounds on your fingers and toes while taking this medicine. You should also tell your healthcare provider if you experience numbness or pain, changes in the skin color, or sensitivity to temperature in your fingers or toes. What side effects may I notice from receiving this medicine? Side effects that you should report to your doctor or health care professional as soon as possible: -allergic reactions like skin rash, itching or hives, swelling of the face, lips, or tongue -anxious -breathing problems -changes in emotions or moods -changes in vision -chest pain or chest tightness -fast, irregular heartbeat -fingers or toes feel numb, cool, painful -hallucination, loss of contact with reality -high blood pressure -males: prolonged or painful erection -seizures -signs and symptoms of serotonin syndrome like confusion, increased sweating, fever, tremor, stiff muscles, diarrhea -signs and symptoms of a stroke like changes in vision; confusion; trouble speaking or understanding; severe headaches; sudden numbness or weakness of the face, arm or leg; trouble walking; dizziness; loss of balance or coordination -suicidal thoughts or other mood changes -uncontrollable head, mouth, neck, arm, or leg movements Side effects that usually do not require medical attention (report to your doctor or health care professional if they continue or are bothersome): -dry mouth -headache -irritability -loss of appetite -nausea -trouble sleeping -weight loss This list may not describe all possible side effects. Call your doctor for medical advice about side effects. You may report side effects to FDA at  1-800-FDA-1088. Where should I keep my medicine? Keep out of the reach of children. This medicine can be abused. Keep your medicine in a safe place to protect it from theft. Do not share this medicine with anyone. Selling or giving away this medicine is dangerous and against the law. Store at room temperature between 15 and 30 degrees C (59 and 86 degrees F). Keep container tightly closed. Protect from light. Throw away any unused medicine after the expiration date. NOTE: This sheet is a summary. It may not cover all possible information. If you have questions about this medicine, talk to your doctor, pharmacist, or health care provider.  2019 Elsevier/Gold Standard (2016-10-23 13:37:27)

## 2018-11-07 ENCOUNTER — Ambulatory Visit (INDEPENDENT_AMBULATORY_CARE_PROVIDER_SITE_OTHER): Payer: BLUE CROSS/BLUE SHIELD | Admitting: Psychiatry

## 2018-11-07 ENCOUNTER — Encounter: Payer: Self-pay | Admitting: Psychiatry

## 2018-11-07 VITALS — BP 129/85 | HR 94 | Ht 67.0 in | Wt 272.0 lb

## 2018-11-07 DIAGNOSIS — F900 Attention-deficit hyperactivity disorder, predominantly inattentive type: Secondary | ICD-10-CM

## 2018-11-07 DIAGNOSIS — F330 Major depressive disorder, recurrent, mild: Secondary | ICD-10-CM | POA: Diagnosis not present

## 2018-11-07 DIAGNOSIS — F411 Generalized anxiety disorder: Secondary | ICD-10-CM | POA: Diagnosis not present

## 2018-11-07 MED ORDER — ESCITALOPRAM OXALATE 10 MG PO TABS: 10.0000 mg | ORAL_TABLET | Freq: Every day | ORAL | 1 refills | Status: DC

## 2018-11-07 MED ORDER — AMPHETAMINE-DEXTROAMPHETAMINE 20 MG PO TABS: 20.0000 mg | ORAL_TABLET | Freq: Every day | ORAL | 0 refills | Status: DC

## 2018-11-07 NOTE — Progress Notes (Signed)
BH MD  OP Progress Note  11/07/2018 4:48 PM Kimberly Knapp  MRN:  [id number]  Chief Complaint: ' I am here for follow up." Chief Complaint    Follow-up     HPI: Kimberly Knapp is a 33 yr old female who is married, lives in [place], used to work as an Char[employer] fundraiser , presented to the clinic today for a follow-up visit.  Patient has a history of ADD, MDD and generalized anxiety disorder.    Patient today presented along with her 40-month-old son ' Kimberly Knapp".  Patient today reports she has noticed that her Adderall wears off towards the evening.  It is also expensive for her since it is an extended release.  She reports she used to be on immediate release previously and would like to go back to it.  Patient reports she is tolerating the Lexapro.  It has helped with her mood.  She however continues to have some relationship struggles with her mother-in-law which kind of causes friction between her and her husband.  She hence continues to struggle with some anxiety symptoms on a regular basis.  Patient reports sleep is fair.  Patient denies any suicidality homicidality or perceptual disturbances.   Visit Diagnosis:    ICD-10-CM   1. MDD (major depressive disorder), recurrent episode, mild (HCC) F33.0 escitalopram (LEXAPRO) 10 MG tablet  2. GAD (generalized anxiety disorder) F41.1   3. Attention deficit hyperactivity disorder (ADHD), predominantly inattentive type F90.0 amphetamine-dextroamphetamine (ADDERALL) 20 MG tablet    Past Psychiatric History: Reviewed past psychiatric history from my progress note on 10/13/2017.  Past trials of Prozac, Zoloft, Celexa, Wellbutrin, Adderall.     Past Medical History:  Past Medical History:  Diagnosis Date  . ADHD (attention deficit hyperactivity disorder)   . Depression   . Goiter   . Herpes genitalis   . Obesity (BMI 30.0-34.9)   . Pregnancy induced hypertension     Past Surgical History:  Procedure Laterality Date  . BREAST ENHANCEMENT SURGERY Bilateral   .  CESAREAN SECTION N/A 05/29/2015   Procedure: CESAREAN SECTION;  Surgeon: [name], MD;  Location: [facility] ORS;  Service: Obstetrics;  Laterality: N/A;  . CESAREAN SECTION N/A 06/25/2018   Procedure: CESAREAN SECTION, repeat;  Surgeon: [name], Elenora Fender, MD;  Location: [facility] ORS;  Service: Obstetrics;  Laterality: N/A;  . DILATION AND CURETTAGE OF UTERUS    . THYROID SURGERY     thyroid biopsy  . WISDOM TOOTH EXTRACTION      Family Psychiatric History: Reviewed family psychiatric history from my progress note on 10/13/2017  Family History:  Family History  Problem Relation Age of Onset  . Diabetes type II Other   . Lung cancer Other   . Heart disease Other   . Lung cancer Other   . ADD / ADHD Father   . Alcohol abuse Maternal Grandfather     Social History: Reviewed social history from my progress note on 10/13/2017 Social History   Socioeconomic History  . Marital status: Married    Spouse name: michael   . Number of children: 3  . Years of education: Not on file  . Highest education level: Associate degree: occupational, Scientist, product/process development, or vocational program  Occupational History  . 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 Financial resource strain: Not hard at all  . Food insecurity:    Worry: Never true    Inability: Never true  . Transportation needs:    Medical: No  Non-medical: No  Tobacco Use  . Smoking status: Former Smoker    Types: Cigarettes    Last attempt to quit: 10/13/2017    Years since quitting: 1.0  . Smokeless tobacco: Never Used  Substance and Sexual Activity  . Alcohol use: Yes    Alcohol/week: 1.0 standard drinks    Types: 1 Glasses of wine per week    Comment: occasional   . Drug use: No  . Sexual activity: Yes    Partners: Male    Birth control/protection: None, I.U.D.  Lifestyle  . Physical activity:    Days per week: 0 days    Minutes per session: 0 min  . Stress: Not on file  Relationships  . Social connections:     Talks on phone: Three times a week    Gets together: Once a week    Attends religious service: More than 4 times per year    Active member of club or organization: No    Attends meetings of clubs or organizations: Never    Relationship status: Married  Other Topics Concern  . Not on file  Social History Narrative  . Not on file    Allergies:  Allergies  Allergen Reactions  . Iodine Swelling  . Shrimp [Shellfish Allergy] Swelling    Metabolic Disorder Labs: No results found for: HGBA1C, MPG No results found for: PROLACTIN No results found for: CHOL, TRIG, HDL, CHOLHDL, VLDL, LDLCALC Lab Results  Component Value Date   TSH 0.423 (L) 09/26/2017    Therapeutic Level Labs: No results found for: LITHIUM No results found for: VALPROATE No components found for:  CBMZ  Current Medications: Current Outpatient Medications  Medication Sig Dispense Refill  . amphetamine-dextroamphetamine (ADDERALL) 20 MG tablet Take 1 tablet (20 mg total) by mouth daily. Take half tablet in the AM and half tablet in the afternoon. 30 tablet 0  . escitalopram (LEXAPRO) 10 MG tablet Take 1 tablet (10 mg total) by mouth daily. 30 tablet 1   No current facility-administered medications for this visit.      Musculoskeletal: Strength & Muscle Tone: within normal limits Gait & Station: normal Patient leans: N/A  Psychiatric Specialty Exam: Review of Systems  Psychiatric/Behavioral: The patient is nervous/anxious.   All other systems reviewed and are negative.   Blood pressure 129/85, pulse 94, height 5\' 7"  (1.702 m), weight 272 lb (123.4 kg), not currently breastfeeding.Body mass index is 42.6 kg/m.  General Appearance: Casual  Eye Contact:  Fair  Speech:  Clear and Coherent  Volume:  Normal  Mood:  Anxious  Affect:  Congruent  Thought Process:  Goal Directed and Descriptions of Associations: Intact  Orientation:  Full (Time, Place, and Person)  Thought Content: Logical   Suicidal  Thoughts:  No  Homicidal Thoughts:  No  Memory:  Immediate;   Fair Recent;   Fair Remote;   Fair  Judgement:  Fair  Insight:  Fair  Psychomotor Activity:  Normal  Concentration:  Concentration: Fair and Attention Span: Fair  Recall:  [employer] of Knowledge: Fair  Language: Fair  Akathisia:  No  Handed:  Right  AIMS (if indicated): denies tremors, rigidity  Assets:  Communication Skills Desire for Improvement Social Support  ADL's:  Intact  Cognition: WNL  Sleep:  Fair   Screenings:   Assessment and Plan: Kaiden is a 33 year old Caucasian female who has a history of depression, anxiety, ADD, presented to clinic today for a follow-up visit.  Patient was initially seen  on February 2019 however was noncompliant with all her medications as well as follow-up visits due to becoming pregnant soon after that.  Patient however returned to clinic on 10/29/2018 and was restarted on medications.  Patient is currently making some progress on the medications.  She continues to struggle with some mood symptoms and hence will benefit from medication readjustment.  Plan MDD-unstable Increase Lexapro to 10 mg p.o. daily  For anxiety-unstable Lexapro 10 mg p.o. daily  For ADD Change Adderall to immediate release 20 mg p.o. daily in divided dosage. I have reviewed [place] controlled substance database.  Follow-up in clinic in 1 month or sooner if needed.    I have spent atleast 15 minutes face to face with patient today. More than 50 % of the time was spent for psychoeducation and supportive psychotherapy and care coordination.  This note was generated in part or whole with voice recognition software. Voice recognition is usually quite accurate but there are transcription errors that can and very often do occur. I apologize for any typographical errors that were not detected and corrected.            Jomarie Longs, MD 11/07/2018, 4:48 PM

## 2018-11-26 ENCOUNTER — Telehealth: Payer: Self-pay | Admitting: Psychiatry

## 2018-11-26 DIAGNOSIS — F900 Attention-deficit hyperactivity disorder, predominantly inattentive type: Secondary | ICD-10-CM

## 2018-11-26 MED ORDER — AMPHETAMINE-DEXTROAMPHETAMINE 20 MG PO TABS: 20.0000 mg | ORAL_TABLET | Freq: Every day | ORAL | 0 refills | Status: DC

## 2018-11-26 NOTE — Telephone Encounter (Signed)
Sent Adderall to pharmacy

## 2018-12-11 ENCOUNTER — Other Ambulatory Visit: Payer: Self-pay | Admitting: Psychiatry

## 2018-12-11 DIAGNOSIS — F330 Major depressive disorder, recurrent, mild: Secondary | ICD-10-CM

## 2019-01-08 ENCOUNTER — Telehealth: Payer: Self-pay

## 2019-01-08 DIAGNOSIS — F900 Attention-deficit hyperactivity disorder, predominantly inattentive type: Secondary | ICD-10-CM

## 2019-01-08 MED ORDER — AMPHETAMINE-DEXTROAMPHETAMINE 20 MG PO TABS: 20.0000 mg | ORAL_TABLET | Freq: Every day | ORAL | 0 refills | Status: DC

## 2019-01-08 NOTE — Telephone Encounter (Signed)
pt needs enough medication to get to her appt set up for  01-15-19

## 2019-01-08 NOTE — Telephone Encounter (Signed)
Sent adderall to pharmacy 

## 2019-01-15 ENCOUNTER — Ambulatory Visit (INDEPENDENT_AMBULATORY_CARE_PROVIDER_SITE_OTHER): Payer: BLUE CROSS/BLUE SHIELD | Admitting: Psychiatry

## 2019-01-15 ENCOUNTER — Other Ambulatory Visit: Payer: Self-pay

## 2019-01-15 ENCOUNTER — Encounter: Payer: Self-pay | Admitting: Psychiatry

## 2019-01-15 DIAGNOSIS — F900 Attention-deficit hyperactivity disorder, predominantly inattentive type: Secondary | ICD-10-CM | POA: Diagnosis not present

## 2019-01-15 DIAGNOSIS — F411 Generalized anxiety disorder: Secondary | ICD-10-CM | POA: Diagnosis not present

## 2019-01-15 DIAGNOSIS — F330 Major depressive disorder, recurrent, mild: Secondary | ICD-10-CM | POA: Diagnosis not present

## 2019-01-15 MED ORDER — AMPHETAMINE-DEXTROAMPHETAMINE 20 MG PO TABS: 20.0000 mg | ORAL_TABLET | Freq: Every day | ORAL | 0 refills | Status: DC

## 2019-01-15 NOTE — Progress Notes (Signed)
Virtual Visit via Video Note  I connected with Kimberly Knapp on 01/15/19 at 10:00 AM EDT by a video enabled telemedicine application and verified that I am speaking with the correct person using two identifiers.   I discussed the limitations of evaluation and management by telemedicine and the availability of in person appointments. The patient expressed understanding and agreed to proceed.    I discussed the assessment and treatment plan with the patient. The patient was provided an opportunity to ask questions and all were answered. The patient agreed with the plan and demonstrated an understanding of the instructions.   The patient was advised to call back or seek an in-person evaluation if the symptoms worsen or if the condition fails to improve as anticipated.   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  Patient today reports she has been anxious about the COVID-19 outbreak.  Her 35-year-old is currently home schooling and needs a lot of support.  Patient reports her [id number]-month-old baby ' Bau", doing well.  She reports initially her relatives could not understand the fact that everybody had to follow social distancing and hence they were getting upset with her for not attending cookouts and other activities.  Patient reports she has been having some trouble relating the fact to her husband who also seems to think it is okay to go and spend time with his family and also go to the beach and other places.  She reports she continues to be worried about that fact however has been doing better than before.  She reports she initially had some trouble sleeping when all this started however her sleep is improved now.   She continues to be compliant with her Lexapro.  Patient reports she takes Adderall daily which helps her to focus on things around the home throughout the day.  She denies any side effects to the Adderall.  Patient denies any suicidality, homicidality or perceptual disturbances.  Patient denies any other concerns today. Visit Diagnosis:    ICD-10-CM   1. MDD (major depressive disorder), recurrent episode, mild (HCC) F33.0   2. GAD (generalized anxiety disorder) F41.1   3. Attention deficit hyperactivity disorder (ADHD), predominantly inattentive type F90.0 amphetamine-dextroamphetamine (ADDERALL) 20 MG tablet    Past Psychiatric History: Past psychiatric history from my progress note on 10/13/2017.  Past trials of Zoloft, Prozac, Celexa, Wellbutrin, Adderall  Past Medical History:  Past Medical History:  Diagnosis Date  . ADHD (attention deficit hyperactivity disorder)   . Depression   . Goiter   . Herpes genitalis   . Obesity (BMI 30.0-34.9)   . Pregnancy induced hypertension     Past Surgical History:  Procedure Laterality Date  . BREAST ENHANCEMENT SURGERY Bilateral   . CESAREAN SECTION N/A 05/29/2015   Procedure: CESAREAN SECTION;  Surgeon: Elenora Fenderhelsea C [name], MD;  Location: [facility] ORS;  Service: Obstetrics;  Laterality: N/A;  . CESAREAN SECTION N/A 06/25/2018   Procedure: CESAREAN SECTION, repeat;  Surgeon: [name], Elenora Fenderhelsea C, MD;  Location: [facility] ORS;  Service: Obstetrics;  Laterality: N/A;  . DILATION AND CURETTAGE OF UTERUS    . THYROID SURGERY     thyroid  biopsy  . WISDOM TOOTH EXTRACTION      Family Psychiatric History: I have reviewed family psychiatric history from my progress note on 10/13/2017  Family History:  Family History  Problem Relation Age of Onset  . Diabetes type II Other   . Lung cancer Other   . Heart disease Other   . Lung cancer Other   . ADD / ADHD Father   . Alcohol abuse Maternal Grandfather     Social History: Reviewed social history from my  progress note on 10/13/2017. Social History   Socioeconomic History  . Marital status: Married    Spouse name: Kimberly Knapp   . Number of children: 3  . Years of education: Not on file  . Highest education level: Associate degree: occupational, Scientist, product/process development, or vocational program  Occupational History  . 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 Financial resource strain: Not hard at all  . Food insecurity:    Worry: Never true    Inability: Never true  . Transportation needs:    Medical: No    Non-medical: No  Tobacco Use  . Smoking status: Former Smoker    Types: Cigarettes    Last attempt to quit: 10/13/2017    Years since quitting: 1.2  . Smokeless tobacco: Never Used  Substance and Sexual Activity  . Alcohol use: Yes    Alcohol/week: 1.0 standard drinks    Types: 1 Glasses of wine per week    Comment: occasional   . Drug use: No  . Sexual activity: Yes    Partners: Male    Birth control/protection: None, I.U.D.  Lifestyle  . Physical activity:    Days per week: 0 days    Minutes per session: 0 min  . Stress: Not on file  Relationships  . Social connections:    Talks on phone: Three times a week    Gets together: Once a week    Attends religious service: More than 4 times per year    Active member of club or organization: No    Attends meetings of clubs or organizations: Never    Relationship status: Married  Other Topics Concern  . Not on file  Social History Narrative  . Not on file    Allergies:  Allergies  Allergen Reactions  . Iodine Swelling  . Shrimp [Shellfish Allergy] Swelling    Metabolic Disorder Labs: No results found for: HGBA1C, MPG No results found for: PROLACTIN No results found for: CHOL, TRIG, HDL, CHOLHDL, VLDL, LDLCALC Lab Results  Component Value Date   TSH 0.423 (L) 09/26/2017    Therapeutic Level Labs: No results found for: LITHIUM No results found for: VALPROATE No components found for:  CBMZ  Current  Medications: Current Outpatient Medications  Medication Sig Dispense Refill  . [START ON 02/06/2019] amphetamine-dextroamphetamine (ADDERALL) 20 MG tablet Take 1 tablet (20 mg total) by mouth daily. Take half tablet in the AM and half tablet in the afternoon. 30 tablet 0  . escitalopram (LEXAPRO) 10 MG tablet TAKE 1 TABLET BY MOUTH EVERY DAY 90 tablet 1   No current facility-administered medications for this visit.      Musculoskeletal: Strength & Muscle Tone: UTA Gait & Station: normal Patient leans: N/A  Psychiatric Specialty Exam: Review of Systems  Psychiatric/Behavioral: The patient is nervous/anxious.   All other systems reviewed and are negative.   not currently breastfeeding.There is no height or weight on file to calculate BMI.  General Appearance: Casual  Eye Contact:  Fair  Speech:  Clear and Coherent  Volume:  Normal  Mood:  Anxious improving  Affect:  Appropriate  Thought Process:  Goal Directed and Descriptions of Associations: Intact  Orientation:  Full (Time, Place, and Person)  Thought Content: Logical   Suicidal Thoughts:  No  Homicidal Thoughts:  No  Memory:  Immediate;   Fair Recent;   Fair Remote;   Fair  Judgement:  Intact  Insight:  Fair  Psychomotor Activity:  Normal  Concentration:  Concentration: Fair and Attention Span: Fair  Recall:  [employer] of Knowledge: Fair  Language: Fair  Akathisia:  No  Handed:  Right  AIMS (if indicated): Denies tremors, rigidity  Assets:  Communication Skills Desire for Improvement Social Support  ADL's:  Intact  Cognition: WNL  Sleep:  Fair   Screenings:   Assessment and Plan: Merianne is a 33 yr old Caucasian female who has a history of depression, anxiety, ADD was evaluated by telemedicine today.  Patient is currently struggling with some anxiety symptoms due to the COVID-19 outbreak.  Patient however has been making progress on the current medication regimen.  Plan as noted  below.  Plan MDD-improving Lexapro 10 mg p.o. daily  For GAD-improving Lexapro 10 mg p.o. daily  For ADD- stable Adderall immediate release 20 mg p.o. daily divided dosage. I have given her 1 prescription with date specified. I have reviewed [place] controlled substance database.  Follow up in clinic in 2 months or sooner if needed.  I have spent atleast 15 minutes non face to face with patient today. More than 50 % of the time was spent for psychoeducation and supportive psychotherapy and care coordination.  This note was generated in part or whole with voice recognition software. Voice recognition is usually quite accurate but there are transcription errors that can and very often do occur. I apologize for any typographical errors that were not detected and corrected.        Jomarie Longs, MD 01/15/2019, 10:26 AM

## 2019-03-07 ENCOUNTER — Other Ambulatory Visit: Payer: Self-pay

## 2019-03-07 ENCOUNTER — Ambulatory Visit (INDEPENDENT_AMBULATORY_CARE_PROVIDER_SITE_OTHER): Payer: BLUE CROSS/BLUE SHIELD | Admitting: Psychiatry

## 2019-03-07 ENCOUNTER — Encounter: Payer: Self-pay | Admitting: Psychiatry

## 2019-03-07 DIAGNOSIS — F900 Attention-deficit hyperactivity disorder, predominantly inattentive type: Secondary | ICD-10-CM | POA: Diagnosis not present

## 2019-03-07 DIAGNOSIS — F330 Major depressive disorder, recurrent, mild: Secondary | ICD-10-CM | POA: Diagnosis not present

## 2019-03-07 DIAGNOSIS — F411 Generalized anxiety disorder: Secondary | ICD-10-CM | POA: Diagnosis not present

## 2019-03-07 MED ORDER — AMPHETAMINE-DEXTROAMPHETAMINE 20 MG PO TABS: 20.0000 mg | ORAL_TABLET | Freq: Every day | ORAL | 0 refills | Status: DC

## 2019-03-07 NOTE — Progress Notes (Signed)
Virtual Visit via Video Note  I connected with Kimberly Knapp on 03/07/19 at 10:00 AM EDT by a video enabled telemedicine application and verified that I am speaking with the correct person using two identifiers.   I discussed the limitations of evaluation and management by telemedicine and the availability of in person appointments. The patient expressed understanding and agreed to proceed.   I discussed the assessment and treatment plan with the patient. The patient was provided an opportunity to ask questions and all were answered. The patient agreed with the plan and demonstrated an understanding of the instructions.   The patient was advised to call back or seek an in-person evaluation if the symptoms worsen or if the condition fails to improve as anticipated.  [place] MD OP Progress Note  03/07/2019 1:48 PM Kimberly Knapp  MRN:  [id number]  Chief Complaint:  Chief Complaint    Follow-up     HPI: Kimberly Knapp is a 33 year old female who is married, lives in [place], used to work as an Therapist, sports, currently on a break from work, has a history of MDD, GAD, ADHD was evaluated by telemedicine today.  Patient today reports she is currently doing okay.  She continues to struggle with some mood symptoms.  She reports there are times when she is anxious and worried and also has some sadness.  She however reports she stopped taking the Lexapro.  She had gone to the beach for a week or 2 and at that time forgot to take the Lexapro.  She reports she did not have any withdrawal symptoms from stopping it.  And when she came back from the beach she decided not to restart it since she felt the Lexapro at that dosage was not really helpful.  Patient continues to take the Adderall as prescribed.  She reports she stays busy during the day with kids being home.  Patient denies any suicidality, homicidality or perceptual disturbances.  Discussed with patient that she did not give the Lexapro enough time.  Also  discussed with her that her dosage could have been increased if she was not getting any effect from the 10 mg.  She did not have any side effects to Lexapro.  When this was discussed patient reports she would like to stay on the 10 mg for now.  However she is open to increasing the dosage after a couple of weeks.   Visit Diagnosis:    ICD-10-CM   1. MDD (major depressive disorder), recurrent episode, mild ([place])  F33.0   2. GAD (generalized anxiety disorder)  F41.1   3. Attention deficit hyperactivity disorder (ADHD), predominantly inattentive type  F90.0 amphetamine-dextroamphetamine (ADDERALL) 20 MG tablet    Past Psychiatric History: I have reviewed past psychiatric history from my progress note on 10/13/2017.  Past trials of Zoloft, Prozac, Celexa, Wellbutrin, Adderall.  Past Medical History:  Past Medical History:  Diagnosis Date  . ADHD (attention deficit hyperactivity disorder)   . Depression   . Goiter   . Herpes genitalis   . Obesity (BMI 30.0-34.9)   . Pregnancy induced hypertension     Past Surgical History:  Procedure Laterality Date  . BREAST ENHANCEMENT SURGERY Bilateral   . CESAREAN SECTION N/A 05/29/2015   Procedure: CESAREAN SECTION;  Surgeon: Honor Loh [name], MD;  Location: [facility] ORS;  Service: Obstetrics;  Laterality: N/A;  . CESAREAN SECTION N/A 06/25/2018   Procedure: CESAREAN SECTION, repeat;  Surgeon: [name], Honor Loh, MD;  Location: [facility] ORS;  Service: Obstetrics;  Laterality: N/A;  . DILATION AND CURETTAGE OF UTERUS    . THYROID SURGERY     thyroid biopsy  . WISDOM TOOTH EXTRACTION      Family Psychiatric History: Reviewed family psychiatric history from my progress note on 10/13/2017 Family History:  Family History  Problem Relation Age of Onset  . Diabetes type II Other   . Lung cancer Other   . Heart disease Other   . Lung cancer Other   . ADD / ADHD Father   . Alcohol abuse Maternal Grandfather     Social History: Reviewed social history from my  progress note on 10/13/2017. Social History   Socioeconomic History  . Marital status: Married    Spouse name: michael   . Number of children: 3  . Years of education: Not on file  . Highest education level: Associate degree: occupational, Scientist, product/process developmenttechnical, or vocational program  Occupational History  . 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 Financial resource strain: Not hard at all  . Food insecurity    Worry: Never true    Inability: Never true  . Transportation needs    Medical: No    Non-medical: No  Tobacco Use  . Smoking status: Former Smoker    Types: Cigarettes    Quit date: 10/13/2017    Years since quitting: 1.3  . Smokeless tobacco: Never Used  Substance and Sexual Activity  . Alcohol use: Yes    Alcohol/week: 1.0 standard drinks    Types: 1 Glasses of wine per week    Comment: occasional   . Drug use: No  . Sexual activity: Yes    Partners: Male    Birth control/protection: None, I.U.D.  Lifestyle  . Physical activity    Days per week: 0 days    Minutes per session: 0 min  . Stress: Not on file  Relationships  . Social Musicianconnections    Talks on phone: Three times a week    Gets together: Once a week    Attends religious service: More than 4 times per year    Active member of club or organization: No    Attends meetings of clubs or organizations: Never    Relationship status: Married  Other Topics Concern  . Not on file  Social History Narrative  . Not on file    Allergies:  Allergies  Allergen Reactions  . Iodine Swelling  . Shrimp [Shellfish Allergy] Swelling    Metabolic Disorder Labs: No results found for: HGBA1C, MPG No results found for: PROLACTIN No results found for: CHOL, TRIG, HDL, CHOLHDL, VLDL, LDLCALC Lab Results  Component Value Date   TSH 0.423 (L) 09/26/2017    Therapeutic Level Labs: No results found for: LITHIUM No results found for: VALPROATE No components found for:  CBMZ  Current  Medications: Current Outpatient Medications  Medication Sig Dispense Refill  . [START ON 03/09/2019] amphetamine-dextroamphetamine (ADDERALL) 20 MG tablet Take 1 tablet (20 mg total) by mouth daily. Take half tablet in the AM and half tablet in the afternoon. 30 tablet 0  . escitalopram (LEXAPRO) 10 MG tablet TAKE 1 TABLET BY MOUTH EVERY DAY 90 tablet 1   No current facility-administered medications for this visit.      Musculoskeletal: Strength & Muscle Tone: within normal limits Gait & Station: normal Patient leans: N/A  Psychiatric Specialty Exam: Review of Systems  Psychiatric/Behavioral: Positive for depression. The patient is nervous/anxious.   All other systems reviewed and are  negative.   not currently breastfeeding.There is no height or weight on file to calculate BMI.  General Appearance: Casual  Eye Contact:  Fair  Speech:  Clear and Coherent  Volume:  Normal  Mood:  Anxious and Depressed  Affect:  Congruent  Thought Process:  Goal Directed and Descriptions of Associations: Intact  Orientation:  Full (Time, Place, and Person)  Thought Content: Logical   Suicidal Thoughts:  No  Homicidal Thoughts:  No  Memory:  Immediate;   Fair Recent;   Fair Remote;   Fair  Judgement:  Fair  Insight:  Fair  Psychomotor Activity:  Normal  Concentration:  Concentration: Fair and Attention Span: Fair  Recall:  [employer]  Fund of Knowledge: Fair  Language: Fair  Akathisia:  No  Handed:  Right  AIMS (if indicated): Denies tremors, rigidity, stiffness  Assets:  Communication Skills Desire for Improvement Housing Intimacy Social Support  ADL's:  Intact  Cognition: WNL  Sleep:  Fair   Screenings:   Assessment and Plan: Kimberly Knapp is a 33 year old Caucasian female who has a history of MDD, GAD, ADD was evaluated by telemedicine today.  Patient continues to struggle with mood symptoms.  She will benefit from medication readjustment.  Patient with noncompliance to Lexapro however  would like to be restarted on it.  Plan MDD-unstable Restart Lexapro 10 mg p.o. daily Discussed with patient increase the dosage to 15 mg if she tolerates it well after 2 to 3 weeks.   For GAD-unstable Restart Lexapro 10 mg p.o. daily  For ADD-stable Adderall 20 mg p.o. daily in divided dosage I have given her 1 prescription with date specified I have reviewed [place] controlled substance database.  Follow-up in clinic in 1 month or sooner if needed.  August 6 at 2:45 PM  I have spent atleast 15 minutes non face to face with patient today. More than 50 % of the time was spent for psychoeducation and supportive psychotherapy and care coordination.  This note was generated in part or whole with voice recognition software. Voice recognition is usually quite accurate but there are transcription errors that can and very often do occur. I apologize for any typographical errors that were not detected and corrected.       Jomarie LongsSaramma Jaiona Simien, MD 03/07/2019, 1:48 PM

## 2019-04-09 ENCOUNTER — Telehealth: Payer: Self-pay

## 2019-04-09 DIAGNOSIS — F330 Major depressive disorder, recurrent, mild: Secondary | ICD-10-CM

## 2019-04-09 DIAGNOSIS — F900 Attention-deficit hyperactivity disorder, predominantly inattentive type: Secondary | ICD-10-CM

## 2019-04-09 MED ORDER — AMPHETAMINE-DEXTROAMPHETAMINE 20 MG PO TABS: 20.0000 mg | ORAL_TABLET | Freq: Every day | ORAL | 0 refills | Status: DC

## 2019-04-09 NOTE — Telephone Encounter (Signed)
pt called states she will not have enough medications until next appt.

## 2019-04-09 NOTE — Telephone Encounter (Signed)
Sent adderall to pharmacy 

## 2019-04-12 ENCOUNTER — Other Ambulatory Visit: Payer: Self-pay | Admitting: Psychiatry

## 2019-04-12 DIAGNOSIS — F330 Major depressive disorder, recurrent, mild: Secondary | ICD-10-CM

## 2019-04-18 ENCOUNTER — Other Ambulatory Visit: Payer: Self-pay

## 2019-04-18 ENCOUNTER — Ambulatory Visit (INDEPENDENT_AMBULATORY_CARE_PROVIDER_SITE_OTHER): Payer: BLUE CROSS/BLUE SHIELD | Admitting: Psychiatry

## 2019-04-18 ENCOUNTER — Encounter: Payer: Self-pay | Admitting: Psychiatry

## 2019-04-18 DIAGNOSIS — F900 Attention-deficit hyperactivity disorder, predominantly inattentive type: Secondary | ICD-10-CM | POA: Diagnosis not present

## 2019-04-18 DIAGNOSIS — F411 Generalized anxiety disorder: Secondary | ICD-10-CM

## 2019-04-18 DIAGNOSIS — F330 Major depressive disorder, recurrent, mild: Secondary | ICD-10-CM | POA: Diagnosis not present

## 2019-04-18 MED ORDER — AMPHETAMINE-DEXTROAMPHETAMINE 20 MG PO TABS: 20.0000 mg | ORAL_TABLET | Freq: Every day | ORAL | 0 refills | Status: DC

## 2019-04-18 MED ORDER — ESCITALOPRAM OXALATE 10 MG PO TABS: 10.0000 mg | ORAL_TABLET | Freq: Every day | ORAL | 1 refills | Status: DC

## 2019-04-18 NOTE — Progress Notes (Signed)
Virtual Visit via Video Note  I connected with Kimberly RoyalsNatalie M Knapp on 04/18/19 at  2:45 PM EDT by a video enabled telemedicine application and verified that I am speaking with the correct person using two identifiers.   I discussed the limitations of evaluation and management by telemedicine and the availability of in person appointments. The patient expressed understanding and agreed to proceed.   I discussed the assessment and treatment plan with the patient. The patient was provided an opportunity to ask questions and all were answered. The patient agreed with the plan and demonstrated an understanding of the instructions.   The patient was advised to call back or seek an in-person evaluation if the symptoms worsen or if the condition fails to improve as anticipated.   BH MD OP Progress Note  04/18/2019 3:22 PM Kimberly Royalsatalie M Knapp  MRN:  [id number]  Chief Complaint:  Chief Complaint    Follow-up     HPI: Kimberly Grebeatalie is a 33 year old female, married, [place] in [place], used to work as an Charity fundraiserN, currently on a break from work, has a history of MDD, GAD, ADHD was evaluated by telemedicine today.  Patient today reports she is currently doing well with regards to her mood.  She denies any significant depression or anxiety symptoms.  She reports sleep is good.  She reports her attention and focus is good on the Adderall.  She denies any side effects to the medication at this time.  She reports she stays busy with her children being around all day.  Her daughter has started virtual schooling.  She reports she enjoys teaching her daughter at home.  She also reports that her [id number]-month-old son has started walking.  She reports support system from her mother and mother-in-law who lives close by.  Patient denies any other concerns today. Visit Diagnosis:    ICD-10-CM   1. MDD (major depressive disorder), recurrent episode, mild (HCC)  F33.0 escitalopram (LEXAPRO) 10 MG tablet   improving  2. GAD  (generalized anxiety disorder)  F41.1    improving  3. Attention deficit hyperactivity disorder (ADHD), predominantly inattentive type  F90.0 amphetamine-dextroamphetamine (ADDERALL) 20 MG tablet    amphetamine-dextroamphetamine (ADDERALL) 20 MG tablet    Past Psychiatric History: I have reviewed past psychiatric history from my progress note on 10/13/2017.  Past trials of Zoloft, Prozac, Celexa, Wellbutrin, Adderall.  Past Medical History:  Past Medical History:  Diagnosis Date  . ADHD (attention deficit hyperactivity disorder)   . Depression   . Goiter   . Herpes genitalis   . Obesity (BMI 30.0-34.9)   . Pregnancy induced hypertension     Past Surgical History:  Procedure Laterality Date  . BREAST ENHANCEMENT SURGERY Bilateral   . CESAREAN SECTION N/A 05/29/2015   Procedure: CESAREAN SECTION;  Surgeon: Elenora Fenderhelsea C [name], MD;  Location: [facility];  Service: Obstetrics;  Laterality: N/A;  . CESAREAN SECTION N/A 06/25/2018   Procedure: CESAREAN SECTION, repeat;  Surgeon: [name], Elenora Fenderhelsea C, MD;  Location: [facility];  Service: Obstetrics;  Laterality: N/A;  . DILATION AND CURETTAGE OF UTERUS    . THYROID SURGERY     thyroid biopsy  . WISDOM TOOTH EXTRACTION      Family Psychiatric History: I have reviewed family psychiatric history from my progress note on 10/13/2017 Family History:  Family History  Problem Relation Age of Onset  . Diabetes type II Other   . Lung cancer Other   . Heart disease Other   . Lung cancer Other   .  ADD / ADHD Father   . Alcohol abuse Maternal Grandfather     Social History: I have reviewed social history from my progress note on 10/13/2017 Social History   Socioeconomic History  . Marital status: Married    Spouse name: Kimberly Knapp   . Number of children: 3  . Years of education: Not on file  . Highest education level: Associate degree: occupational, Scientist, product/process developmenttechnical, or vocational program  Occupational History  . 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 Financial resource strain: Not hard at all  . Food insecurity    Worry: Never true    Inability: Never true  . Transportation needs    Medical: No    Non-medical: No  Tobacco Use  . Smoking status: Former Smoker    Types: Cigarettes    Quit date: 10/13/2017    Years since quitting: 1.5  . Smokeless tobacco: Never Used  Substance and Sexual Activity  . Alcohol use: Yes    Alcohol/week: 1.0 standard drinks    Types: 1 Glasses of wine per week    Comment: occasional   . Drug use: No  . Sexual activity: Yes    Partners: Male    Birth control/protection: None, I.U.D.  Lifestyle  . Physical activity    Days per week: 0 days    Minutes per session: 0 min  . Stress: Not on file  Relationships  . Social Musicianconnections    Talks on phone: Three times a week    Gets together: Once a week    Attends religious service: More than 4 times per year    Active member of club or organization: No    Attends meetings of clubs or organizations: Never    Relationship status: Married  Other Topics Concern  . Not on file  Social History Narrative  . Not on file    Allergies:  Allergies  Allergen Reactions  . Iodine Swelling  . Shrimp [Shellfish Allergy] Swelling    Metabolic Disorder Labs: No results found for: HGBA1C, MPG No results found for: PROLACTIN No results found for: CHOL, TRIG, HDL, CHOLHDL, VLDL, LDLCALC Lab Results  Component Value Date   TSH 0.423 (L) 09/26/2017    Therapeutic Level Labs: No results found for: LITHIUM No results found for: VALPROATE No components found for:  CBMZ  Current Medications: Current Outpatient Medications  Medication Sig Dispense Refill  . [START ON 05/08/2019] amphetamine-dextroamphetamine (ADDERALL) 20 MG tablet Take 1 tablet (20 mg total) by mouth daily. Take half tablet in the AM and half tablet in the afternoon. 30 tablet 0  . [START ON 06/06/2019] amphetamine-dextroamphetamine (ADDERALL) 20 MG tablet Take 1 tablet (20  mg total) by mouth daily. Take half tablet AM and half tablet in the afternoon 30 tablet 0  . escitalopram (LEXAPRO) 10 MG tablet Take 1 tablet (10 mg total) by mouth daily. 90 tablet 1   No current facility-administered medications for this visit.      Musculoskeletal: Strength & Muscle Tone: UTA Gait & Station: normal Patient leans: N/A  Psychiatric Specialty Exam: Review of Systems  Psychiatric/Behavioral: The patient is not nervous/anxious.   All other systems reviewed and are negative.   not currently breastfeeding.There is no height or weight on file to calculate BMI.  General Appearance: Casual  Eye Contact:  Fair  Speech:  Clear and Coherent  Volume:  Normal  Mood:  Euthymic  Affect:  Appropriate  Thought Process:  Goal Directed and  Descriptions of Associations: Intact  Orientation:  Full (Time, Place, and Person)  Thought Content: Logical   Suicidal Thoughts:  No  Homicidal Thoughts:  No  Memory:  Immediate;   Fair Recent;   Fair Remote;   Fair  Judgement:  Fair  Insight:  Fair  Psychomotor Activity:  Normal  Concentration:  Concentration: Fair and Attention Span: Fair  Recall:  [employer] of Knowledge: Fair  Language: Fair  Akathisia:  No  Handed:  Right  AIMS (if indicated): Denies tremors, rigidity  Assets:  Communication Skills Desire for Improvement Social Support  ADL's:  Intact  Cognition: WNL  Sleep:  Fair   Screenings:   Assessment and Plan: Deborah is a 33 year old Caucasian female, has a history of MDD, GAD, ADD was evaluated by telemedicine today.  Patient today reports she is currently making progress on the current medication regimen.  She denies any significant depression or anxiety symptoms.  Plan as noted below.  Plan For MDD-improving Lexapro 10 mg p.o. daily  For GAD-improving Lexapro 10 mg p.o. daily  For ADD-stable Adderall 20 mg p.o. daily in divided dosage I have given her 2 prescriptions with date specified-last 1 to be  filled on or after 06/06/2019  Follow-up in clinic in 1 to 2 months or sooner if needed.  October 22 at 2 PM  I have spent atleast 15 minutes non  face to face with patient today. More than 50 % of the time was spent for psychoeducation and supportive psychotherapy and care coordination.  This note was generated in part or whole with voice recognition software. Voice recognition is usually quite accurate but there are transcription errors that can and very often do occur. I apologize for any typographical errors that were not detected and corrected.      Ursula Alert, MD 04/18/2019, 3:22 PM

## 2019-07-04 ENCOUNTER — Ambulatory Visit (INDEPENDENT_AMBULATORY_CARE_PROVIDER_SITE_OTHER): Payer: BLUE CROSS/BLUE SHIELD | Admitting: Psychiatry

## 2019-07-04 ENCOUNTER — Other Ambulatory Visit: Payer: Self-pay

## 2019-07-04 ENCOUNTER — Encounter: Payer: Self-pay | Admitting: Psychiatry

## 2019-07-04 DIAGNOSIS — F411 Generalized anxiety disorder: Secondary | ICD-10-CM | POA: Diagnosis not present

## 2019-07-04 DIAGNOSIS — R102 Pelvic and perineal pain: Secondary | ICD-10-CM | POA: Insufficient documentation

## 2019-07-04 DIAGNOSIS — F330 Major depressive disorder, recurrent, mild: Secondary | ICD-10-CM | POA: Diagnosis not present

## 2019-07-04 DIAGNOSIS — F900 Attention-deficit hyperactivity disorder, predominantly inattentive type: Secondary | ICD-10-CM | POA: Diagnosis not present

## 2019-07-04 MED ORDER — AMPHETAMINE-DEXTROAMPHETAMINE 20 MG PO TABS: 20.0000 mg | ORAL_TABLET | Freq: Every day | ORAL | 0 refills | Status: DC

## 2019-07-04 MED ORDER — TRAZODONE HCL 50 MG PO TABS: 25.0000 mg | ORAL_TABLET | Freq: Every evening | ORAL | 1 refills | Status: DC | PRN

## 2019-07-04 NOTE — Progress Notes (Signed)
Virtual Visit via Telephone Note  I connected with Kimberly Knapp on 07/04/19 at  2:00 PM EDT by telephone and verified that I am speaking with the correct person using two identifiers.   I discussed the limitations, risks, security and privacy concerns of performing an evaluation and management service by telephone and the availability of in person appointments. I also discussed with the patient that there may be a patient responsible charge related to this service. The patient expressed understanding and agreed to proceed.   I discussed the assessment and treatment plan with the patient. The patient was provided an opportunity to ask questions and all were answered. The patient agreed with the plan and demonstrated an understanding of the instructions.   The patient was advised to call back or seek an in-person evaluation if the symptoms worsen or if the condition fails to improve as anticipated.   BH MD OP Progress Note  07/04/2019 5:13 PM Kimberly Knapp  MRN:  [id number]  Chief Complaint:  Chief Complaint    Follow-up     HPI: Kimberly Grebeatalie is a 33 year old female, married, [place] in [place] used to work as an Charity fundraiserN, currently on break from work, has a history of MDD, GAD, ADHD was evaluated by telemedicine today.  Patient preferred to do a phone call.  Patient today reports she is currently overwhelmed due to taking care of her children, helping them with virtual schooling and so on.  She reports she also stopped taking her Lexapro for a few days since she forgot.  She started feeling more anxious and sad and hence she started taking the medication again.  She agrees to stay compliant.  Patient reports sleep also is restless.  She reports she is interested in a sleep medication since she is planning to train her youngest child also to sleep better at night.  She is hoping she can start using a sleep medication after the baby starts sleeping through the night.  Patient reports she has been  struggling with some fatigue and concentration problems during the day.  She wonders whether her Adderall needs to be readjusted.  Patient denies any suicidality, homicidality or perceptual disturbances.  She denies any other concerns today.   Visit Diagnosis:    ICD-10-CM   1. MDD (major depressive disorder), recurrent episode, mild (HCC)  F33.0 traZODone (DESYREL) 50 MG tablet  2. GAD (generalized anxiety disorder)  F41.1   3. Attention deficit hyperactivity disorder (ADHD), predominantly inattentive type  F90.0 amphetamine-dextroamphetamine (ADDERALL) 20 MG tablet    Past Psychiatric History: I have reviewed past psychiatric history from my progress note on 10/13/2017.  Past trials of Zoloft, Prozac, Celexa, Wellbutrin, Adderall  Past Medical History:  Past Medical History:  Diagnosis Date  . ADHD (attention deficit hyperactivity disorder)   . Depression   . Goiter   . Herpes genitalis   . Obesity (BMI 30.0-34.9)   . Pregnancy induced hypertension     Past Surgical History:  Procedure Laterality Date  . BREAST ENHANCEMENT SURGERY Bilateral   . CESAREAN SECTION N/A 05/29/2015   Procedure: CESAREAN SECTION;  Surgeon: Elenora Fenderhelsea C [name], MD;  Location: [facility];  Service: Obstetrics;  Laterality: N/A;  . CESAREAN SECTION N/A 06/25/2018   Procedure: CESAREAN SECTION, repeat;  Surgeon: [name], Elenora Fenderhelsea C, MD;  Location: [facility] OR[facility];  Service: Obstetrics;  Laterality: N/A;  . DILATION AND CURETTAGE OF UTERUS    . THYROID SURGERY     thyroid biopsy  . WISDOM TOOTH EXTRACTION  Family Psychiatric History: I have reviewed family psychiatric history from my progress note on 10/13/2017.  Family History:  Family History  Problem Relation Age of Onset  . Diabetes type II Other   . Lung cancer Other   . Heart disease Other   . Lung cancer Other   . ADD / ADHD Father   . Alcohol abuse Maternal Grandfather     Social History: I have reviewed social history from my progress note on  10/13/2017. Social History   Socioeconomic History  . Marital status: Married    Spouse name: michael   . Number of children: 3  . Years of education: Not on file  . Highest education level: Associate degree: occupational, Hotel manager, or vocational program  Occupational History  . 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 Financial resource strain: Not hard at all  . Food insecurity    Worry: Never true    Inability: Never true  . Transportation needs    Medical: No    Non-medical: No  Tobacco Use  . Smoking status: Former Smoker    Types: Cigarettes    Quit date: 10/13/2017    Years since quitting: 1.7  . Smokeless tobacco: Never Used  Substance and Sexual Activity  . Alcohol use: Yes    Alcohol/week: 1.0 standard drinks    Types: 1 Glasses of wine per week    Comment: occasional   . Drug use: No  . Sexual activity: Yes    Partners: Male    Birth control/protection: None, I.U.D.  Lifestyle  . Physical activity    Days per week: 0 days    Minutes per session: 0 min  . Stress: Not on file  Relationships  . Social Herbalist on phone: Three times a week    Gets together: Once a week    Attends religious service: More than 4 times per year    Active member of club or organization: No    Attends meetings of clubs or organizations: Never    Relationship status: Married  Other Topics Concern  . Not on file  Social History Narrative  . Not on file    Allergies:  Allergies  Allergen Reactions  . Iodine Swelling  . Shrimp [Shellfish Allergy] Swelling    Metabolic Disorder Labs: No results found for: HGBA1C, MPG No results found for: PROLACTIN No results found for: CHOL, TRIG, HDL, CHOLHDL, VLDL, LDLCALC Lab Results  Component Value Date   TSH 0.423 (L) 09/26/2017    Therapeutic Level Labs: No results found for: LITHIUM No results found for: VALPROATE No components found for:  CBMZ  Current Medications: Current Outpatient  Medications  Medication Sig Dispense Refill  . amphetamine-dextroamphetamine (ADDERALL) 20 MG tablet Take 1 tablet (20 mg total) by mouth daily. Take half tablet AM and half tablet in the afternoon 30 tablet 0  . [START ON 07/12/2019] amphetamine-dextroamphetamine (ADDERALL) 20 MG tablet Take 1 tablet (20 mg total) by mouth daily. Take half tablet in the AM and half tablet in the afternoon. 30 tablet 0  . escitalopram (LEXAPRO) 10 MG tablet Take 1 tablet (10 mg total) by mouth daily. 90 tablet 1  . traZODone (DESYREL) 50 MG tablet Take 0.5-1 tablets (25-50 mg total) by mouth at bedtime as needed for sleep. 30 tablet 1   No current facility-administered medications for this visit.      Musculoskeletal: Strength & Muscle Tone: UTA Gait & Station: Reports  as WNL Patient leans: N/A  Psychiatric Specialty Exam: Review of Systems  Psychiatric/Behavioral: The patient is nervous/anxious.   All other systems reviewed and are negative.   not currently breastfeeding.There is no height or weight on file to calculate BMI.  General Appearance: UTA  Eye Contact:  UTA  Speech:  Clear and Coherent  Volume:  Normal  Mood:  Anxious  Affect:  UTA  Thought Process:  Goal Directed and Descriptions of Associations: Intact  Orientation:  Full (Time, Place, and Person)  Thought Content: Logical   Suicidal Thoughts:  No  Homicidal Thoughts:  No  Memory:  Immediate;   Fair Recent;   Fair Remote;   Fair  Judgement:  Fair  Insight:  Fair  Psychomotor Activity:  UTA  Concentration:  Concentration: Fair and Attention Span: Fair  Recall:  [employer] of Knowledge: Fair  Language: Fair  Akathisia:  No  Handed:  Right  AIMS (if indicated): Denies tremors, rigidity  Assets:  Communication Skills Desire for Improvement Housing Intimacy Social Support Talents/Skills Transportation Vocational/Educational  ADL's:  Intact  Cognition: WNL  Sleep:  restless   Screenings:   Assessment and Plan:  Kimberly Knapp is a 33 year old Caucasian female has a history of MDD, GAD, ADD was evaluated by phone today.  Patient is currently struggling with sleep problems as well as fatigue and concentration issues.  Patient also noncompliant with her antidepressant.  Discussed plan as noted below.  Plan MDD-unstable Restarted Lexapro 10 mg p.o. daily.  Encouraged compliance. Start trazodone 25 to 50 mg p.o. nightly as needed.  Discussed sleep hygiene techniques.  GAD-improving Lexapro 10 mg p.o. daily  ADD-stable Adderall 20 mg p.o. daily in divided dosage Patient does have concentration problems which likely are due to her increased stressors as well as sleep issues. Discussed with patient to stay compliant on her medications as well as to work on her sleep hygiene.  Also advised her to make use of social support system.  Patient advised to start psychotherapy sessions.  Follow-up in clinic in 4 weeks or sooner if needed.  November 23rd at 8:30 AM  I have spent atleast 15 minutes non  face to face with patient today. More than 50 % of the time was spent for psychoeducation and supportive psychotherapy and care coordination. This note was generated in part or whole with voice recognition software. Voice recognition is usually quite accurate but there are transcription errors that can and very often do occur. I apologize for any typographical errors that were not detected and corrected.       Jomarie Longs, MD 07/04/2019, 5:13 PM

## 2019-08-05 ENCOUNTER — Ambulatory Visit (INDEPENDENT_AMBULATORY_CARE_PROVIDER_SITE_OTHER): Payer: BLUE CROSS/BLUE SHIELD | Admitting: Psychiatry

## 2019-08-05 ENCOUNTER — Encounter: Payer: Self-pay | Admitting: Psychiatry

## 2019-08-05 ENCOUNTER — Other Ambulatory Visit: Payer: Self-pay

## 2019-08-05 ENCOUNTER — Other Ambulatory Visit: Payer: Self-pay | Admitting: Psychiatry

## 2019-08-05 DIAGNOSIS — F3341 Major depressive disorder, recurrent, in partial remission: Secondary | ICD-10-CM | POA: Diagnosis not present

## 2019-08-05 DIAGNOSIS — F900 Attention-deficit hyperactivity disorder, predominantly inattentive type: Secondary | ICD-10-CM

## 2019-08-05 DIAGNOSIS — F330 Major depressive disorder, recurrent, mild: Secondary | ICD-10-CM

## 2019-08-05 DIAGNOSIS — F411 Generalized anxiety disorder: Secondary | ICD-10-CM | POA: Diagnosis not present

## 2019-08-05 MED ORDER — AMPHETAMINE-DEXTROAMPHETAMINE 20 MG PO TABS: 20.0000 mg | ORAL_TABLET | Freq: Every day | ORAL | 0 refills | Status: DC

## 2019-08-05 NOTE — Progress Notes (Signed)
Virtual Visit via Video Note  I connected with Kimberly Knapp on 08/05/19 at  8:30 AM EST by a video enabled telemedicine application and verified that I am speaking with the correct person using two identifiers.   I discussed the limitations of evaluation and management by telemedicine and the availability of in person appointments. The patient expressed understanding and agreed to proceed.     I discussed the assessment and treatment plan with the patient. The patient was provided an opportunity to ask questions and all were answered. The patient agreed with the plan and demonstrated an understanding of the instructions.   The patient was advised to call back or seek an in-person evaluation if the symptoms worsen or if the condition fails to improve as anticipated.   [facility] MD OP Progress Note  08/05/2019 12:09 PM Kimberly Knapp  MRN:  [id number]  Chief Complaint:  Chief Complaint    Follow-up     HPI: Kimberly Knapp is a 32 year old female, married, lives in [place], currently unemployed, has a history of MDD, GAD, ADHD was evaluated by telemedicine today.  Patient today reports she is currently making progress on the current medication regimen.  She reports she currently does not feel depressed or as anxious as she used to before.  The Lexapro is beneficial and she is compliant on the same.  She denies side effects.  She reports sleep is good.  There has been days when she needed the trazodone.  The trazodone does help her to fall asleep.  She does have young children who may need her help however overall sleep is better.  She denies any side effects to the trazodone at this time.  She continues to take the Adderall as prescribed.  She denies side effects.  She reports appetite is fair.  Patient denies any perceptual disturbances.  Patient denies any suicidality or homicidality.  Denies any substance abuse problems.  She denies any other concerns today. Visit Diagnosis:   ICD-10-CM   1. MDD (major depressive disorder), recurrent, in partial remission (HCC)  F33.41   2. GAD (generalized anxiety disorder)  F41.1   3. Attention deficit hyperactivity disorder (ADHD), predominantly inattentive type  F90.0 amphetamine-dextroamphetamine (ADDERALL) 20 MG tablet    amphetamine-dextroamphetamine (ADDERALL) 20 MG tablet    Past Psychiatric History: I have reviewed past psychiatric history from my progress note on 10/13/2017.  Past trials of Zoloft, Prozac, Celexa, Wellbutrin, Adderall.  Past Medical History:  Past Medical History:  Diagnosis Date  . ADHD (attention deficit hyperactivity disorder)   . Depression   . Goiter   . Herpes genitalis   . Obesity (BMI 30.0-34.9)   . Pregnancy induced hypertension     Past Surgical History:  Procedure Laterality Date  . BREAST ENHANCEMENT SURGERY Bilateral   . CESAREAN SECTION N/A 05/29/2015   Procedure: CESAREAN SECTION;  Surgeon: [name], MD;  Location: [facility] ORS;  Service: Obstetrics;  Laterality: N/A;  . CESAREAN SECTION N/A 06/25/2018   Procedure: CESAREAN SECTION, repeat;  Surgeon: [name], Elenora Fender, MD;  Location: [facility] ORS;  Service: Obstetrics;  Laterality: N/A;  . DILATION AND CURETTAGE OF UTERUS    . THYROID SURGERY     thyroid biopsy  . WISDOM TOOTH EXTRACTION      Family Psychiatric History: I have reviewed family psychiatric history from my progress note on 10/13/2017.  Family History:  Family History  Problem Relation Age of Onset  . Diabetes type II Other   . Lung cancer Other   .  Heart disease Other   . Lung cancer Other   . ADD / ADHD Father   . Alcohol abuse Maternal Grandfather     Social History: I have reviewed social history from my progress note on 10/13/2017. Social History   Socioeconomic History  . Marital status: Married    Spouse name: michael   . Number of children: 3  . Years of education: Not on file  . Highest education level: Associate degree: occupational, Hotel manager, or  vocational program  Occupational History  . 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 Financial resource strain: Not hard at all  . Food insecurity    Worry: Never true    Inability: Never true  . Transportation needs    Medical: No    Non-medical: No  Tobacco Use  . Smoking status: Former Smoker    Types: Cigarettes    Quit date: 10/13/2017    Years since quitting: 1.8  . Smokeless tobacco: Never Used  Substance and Sexual Activity  . Alcohol use: Yes    Alcohol/week: 1.0 standard drinks    Types: 1 Glasses of wine per week    Comment: occasional   . Drug use: No  . Sexual activity: Yes    Partners: Male    Birth control/protection: None, I.U.D.  Lifestyle  . Physical activity    Days per week: 0 days    Minutes per session: 0 min  . Stress: Not on file  Relationships  . Social Herbalist on phone: Three times a week    Gets together: Once a week    Attends religious service: More than 4 times per year    Active member of club or organization: No    Attends meetings of clubs or organizations: Never    Relationship status: Married  Other Topics Concern  . Not on file  Social History Narrative  . Not on file    Allergies:  Allergies  Allergen Reactions  . Iodine Swelling  . Shrimp [Shellfish Allergy] Swelling    Metabolic Disorder Labs: No results found for: HGBA1C, MPG No results found for: PROLACTIN No results found for: CHOL, TRIG, HDL, CHOLHDL, VLDL, LDLCALC Lab Results  Component Value Date   TSH 0.423 (L) 09/26/2017    Therapeutic Level Labs: No results found for: LITHIUM No results found for: VALPROATE No components found for:  CBMZ  Current Medications: Current Outpatient Medications  Medication Sig Dispense Refill  . [START ON 08/13/2019] amphetamine-dextroamphetamine (ADDERALL) 20 MG tablet Take 1 tablet (20 mg total) by mouth daily. Take half tablet AM and half tablet in the afternoon 30 tablet 0  . [START ON  09/11/2019] amphetamine-dextroamphetamine (ADDERALL) 20 MG tablet Take 1 tablet (20 mg total) by mouth daily. Take half tablet in the AM and half tablet in the afternoon. 30 tablet 0  . escitalopram (LEXAPRO) 10 MG tablet Take 1 tablet (10 mg total) by mouth daily. 90 tablet 1  . traZODone (DESYREL) 50 MG tablet Take 0.5-1 tablets (25-50 mg total) by mouth at bedtime as needed for sleep. 30 tablet 1   No current facility-administered medications for this visit.      Musculoskeletal: Strength & Muscle Tone: UTA Gait & Station: normal Patient leans: N/A  Psychiatric Specialty Exam: Review of Systems  Psychiatric/Behavioral: Negative for depression, hallucinations, substance abuse and suicidal ideas. The patient is not nervous/anxious.   All other systems reviewed and are negative.   not currently  breastfeeding.There is no height or weight on file to calculate BMI.  General Appearance: Casual  Eye Contact:  Fair  Speech:  Clear and Coherent  Volume:  Normal  Mood:  Euthymic  Affect:  Congruent  Thought Process:  Goal Directed and Descriptions of Associations: Intact  Orientation:  Full (Time, Place, and Person)  Thought Content: Logical   Suicidal Thoughts:  No  Homicidal Thoughts:  No  Memory:  Immediate;   Fair Recent;   Fair Remote;   Fair  Judgement:  Fair  Insight:  Fair  Psychomotor Activity:  Normal  Concentration:  Concentration: Fair and Attention Span: Fair  Recall:  Fair  Fund of Knowledge: [employer]  Language: Fair  Akathisia:  No  Handed:  Right  AIMS (if indicated):Denies tremors, rigidity  Assets:  Communication Skills Desire for Improvement Housing Intimacy Physical Health Social Support Talents/Skills Transportation Vocational/Educational  ADL's:  Intact  Cognition: WNL  Sleep:  Fair   Screenings:   Assessment and Plan: Kimberly Knapp is a 33 year old Caucasian female has a history of MDD, GAD, ADD was evaluated by telemedicine today.  She is currently  making progress on the current medication regimen.  Plan as noted below.  Plan MDD in partial remission Lexapro 10 mg p.o. daily.  She is currently compliant. Trazodone 25 to 50 mg p.o. nightly as needed.  Patient to continue to work on sleep hygiene techniques  GAD-stable Lexapro 10 mg p.o. daily  ADD-stable Adderall 20 mg p.o. daily in divided dosage Reviewed [place] controlled substance database. I have provided 2 prescriptions with date specified last 1 to be filled on or after 09/11/2019.  Follow-up in clinic in 2 months or sooner if needed.  January 22 at 10 AM  I have spent atleast 15 minutes non face to face with patient today. More than 50 % of the time was spent for psychoeducation and supportive psychotherapy and care coordination. This note was generated in part or whole with voice recognition software. Voice recognition is usually quite accurate but there are transcription errors that can and very often do occur. I apologize for any typographical errors that were not detected and corrected.        Jomarie LongsSaramma Jamile Sivils, MD 08/05/2019, 12:09 PM

## 2019-08-13 ENCOUNTER — Other Ambulatory Visit: Payer: Self-pay | Admitting: Psychiatry

## 2019-08-13 DIAGNOSIS — F330 Major depressive disorder, recurrent, mild: Secondary | ICD-10-CM

## 2019-08-22 ENCOUNTER — Other Ambulatory Visit: Payer: Self-pay

## 2019-08-22 DIAGNOSIS — Z20822 Contact with and (suspected) exposure to covid-19: Secondary | ICD-10-CM

## 2019-08-23 LAB — NOVEL CORONAVIRUS, NAA: SARS-CoV-2, NAA: NOT DETECTED

## 2019-10-04 ENCOUNTER — Ambulatory Visit (INDEPENDENT_AMBULATORY_CARE_PROVIDER_SITE_OTHER): Payer: BLUE CROSS/BLUE SHIELD | Admitting: Psychiatry

## 2019-10-04 ENCOUNTER — Other Ambulatory Visit: Payer: Self-pay

## 2019-10-04 ENCOUNTER — Encounter: Payer: Self-pay | Admitting: Psychiatry

## 2019-10-04 DIAGNOSIS — F411 Generalized anxiety disorder: Secondary | ICD-10-CM

## 2019-10-04 DIAGNOSIS — F900 Attention-deficit hyperactivity disorder, predominantly inattentive type: Secondary | ICD-10-CM

## 2019-10-04 DIAGNOSIS — F3342 Major depressive disorder, recurrent, in full remission: Secondary | ICD-10-CM

## 2019-10-04 MED ORDER — ESCITALOPRAM OXALATE 10 MG PO TABS: 10.0000 mg | ORAL_TABLET | Freq: Every day | ORAL | 1 refills | Status: DC

## 2019-10-04 MED ORDER — AMPHETAMINE-DEXTROAMPHETAMINE 20 MG PO TABS: 30.0000 mg | ORAL_TABLET | Freq: Every day | ORAL | 0 refills | Status: DC

## 2019-10-04 NOTE — Progress Notes (Signed)
Virtual Visit via Video Note  I connected with Kimberly Knapp on 10/04/19 at 10:00 AM EST by a video enabled telemedicine application and verified that I am speaking with the correct person using two identifiers.   I discussed the limitations of evaluation and management by telemedicine and the availability of in person appointments. The patient expressed understanding and agreed to proceed.     I discussed the assessment and treatment plan with the patient. The patient was provided an opportunity to ask questions and all were answered. The patient agreed with the plan and demonstrated an understanding of the instructions.   The patient was advised to call back or seek an in-person evaluation if the symptoms worsen or if the condition fails to improve as anticipated.   [facility] MD OP Progress Note  10/04/2019 12:20 PM JAIA ALONGE  MRN:  [id number]  Chief Complaint:  Chief Complaint    Follow-up     HPI: Kimberly Knapp is a 34 year old female, married, lives in [place], currently unemployed, has a history of MDD, GAD, ADHD, was evaluated by patient today.  Patient today reports she is currently struggling with attention and focus.  She is procrastinating a lot.  She is unable to complete her task around the house.  She is struggling taking care of her 3 children, and doing chores around the house.  She reports her daughter went back to school for the first time today.  If that goes well she will be able to at least go back to work part-time at some point.  She is planning to do that.  She reports her inability to do the work around her home is making her more and more anxious.  She reports she usually sleeps well however last night she could not sleep much because her baby was teething and was restless.  She continues to take trazodone which helps.  Patient denies any suicidality, homicidality or perceptual disturbances.  Patient reports she is compliant on her medications as  prescribed.  Patient denies any other concerns today.   Visit Diagnosis:    ICD-10-CM   1. MDD (major depressive disorder), recurrent, in full remission ([place])  F33.42 escitalopram (LEXAPRO) 10 MG tablet   improving  2. GAD (generalized anxiety disorder)  F41.1   3. Attention deficit hyperactivity disorder (ADHD), predominantly inattentive type  F90.0 amphetamine-dextroamphetamine (ADDERALL) 20 MG tablet    Past Psychiatric History: Reviewed past psychiatric history from my progress note on 10/13/2017.  Past trials of Zoloft, Prozac, Celexa, Wellbutrin, Adderall  Past Medical History:  Past Medical History:  Diagnosis Date  . ADHD (attention deficit hyperactivity disorder)   . Depression   . Goiter   . Herpes genitalis   . Obesity (BMI 30.0-34.9)   . Pregnancy induced hypertension     Past Surgical History:  Procedure Laterality Date  . BREAST ENHANCEMENT SURGERY Bilateral   . CESAREAN SECTION N/A 05/29/2015   Procedure: CESAREAN SECTION;  Surgeon: Honor Loh [name], MD;  Location: [facility] ORS;  Service: Obstetrics;  Laterality: N/A;  . CESAREAN SECTION N/A 06/25/2018   Procedure: CESAREAN SECTION, repeat;  Surgeon: [name], Honor Loh, MD;  Location: [facility] ORS;  Service: Obstetrics;  Laterality: N/A;  . DILATION AND CURETTAGE OF UTERUS    . THYROID SURGERY     thyroid biopsy  . WISDOM TOOTH EXTRACTION      Family Psychiatric History: Reviewed family psychiatric history from my progress note on 10/13/2017.  Family History:  Family History  Problem Relation Age  of Onset  . Diabetes type II Other   . Lung cancer Other   . Heart disease Other   . Lung cancer Other   . ADD / ADHD Father   . Alcohol abuse Maternal Grandfather     Social History: Reviewed social history from my progress note on 10/13/2017. Social History   Socioeconomic History  . Marital status: Married    Spouse name: michael   . Number of children: 3  . Years of education: Not on file  . Highest education level:  Associate degree: occupational, Scientist, product/process development, or vocational program  Occupational History  . 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 Smoking status: Former Smoker    Types: Cigarettes    Quit date: 10/13/2017    Years since quitting: 1.9  . Smokeless tobacco: Never Used  Substance and Sexual Activity  . Alcohol use: Yes    Alcohol/week: 1.0 standard drinks    Types: 1 Glasses of wine per week    Comment: occasional   . Drug use: No  . Sexual activity: Yes    Partners: Male    Birth control/protection: None, I.U.D.  Other Topics Concern  . Not on file  Social History Narrative  . Not on file   Social Determinants of Health   Financial Resource Strain:   . Difficulty of Paying Living Expenses: Not on file  Food Insecurity:   . Worried About Programme researcher, broadcasting/film/video in the Last Year: Not on file  . Ran Out of Food in the Last Year: Not on file  Transportation Needs:   . Lack of Transportation (Medical): Not on file  . Lack of Transportation (Non-Medical): Not on file  Physical Activity:   . Days of Exercise per Week: Not on file  . Minutes of Exercise per Session: Not on file  Stress:   . Feeling of Stress : Not on file  Social Connections:   . Frequency of Communication with Friends and Family: Not on file  . Frequency of Social Gatherings with Friends and Family: Not on file  . Attends Religious Services: Not on file  . Active Member of Clubs or Organizations: Not on file  . Attends Banker Meetings: Not on file  . Marital Status: Not on file    Allergies:  Allergies  Allergen Reactions  . Iodine Swelling  . Shrimp [Shellfish Allergy] Swelling    Metabolic Disorder Labs: No results found for: HGBA1C, MPG No results found for: PROLACTIN No results found for: CHOL, TRIG, HDL, CHOLHDL, VLDL, LDLCALC Lab Results  Component Value Date   TSH 0.423 (L) 09/26/2017    Therapeutic Level Labs: No results found for: LITHIUM No results  found for: VALPROATE No components found for:  CBMZ  Current Medications: Current Outpatient Medications  Medication Sig Dispense Refill  . amphetamine-dextroamphetamine (ADDERALL) 20 MG tablet Take 1.5 tablets (30 mg total) by mouth daily. Take one tablet AM and half tablet PM 45 tablet 0  . escitalopram (LEXAPRO) 10 MG tablet Take 1 tablet (10 mg total) by mouth daily. 90 tablet 1  . traZODone (DESYREL) 50 MG tablet TAKE 0.5-1 TABLETS (25-50 MG TOTAL) BY MOUTH AT BEDTIME AS NEEDED FOR SLEEP. 90 tablet 1   No current facility-administered medications for this visit.     Musculoskeletal: Strength & Muscle Tone: UTA Gait & Station: normal Patient leans: N/A  Psychiatric Specialty Exam: Review of Systems  Psychiatric/Behavioral: The patient is nervous/anxious.   All  other systems reviewed and are negative.   not currently breastfeeding.There is no height or weight on file to calculate BMI.  General Appearance: Casual  Eye Contact:  Fair  Speech:  Clear and Coherent  Volume:  Normal  Mood:  Anxious  Affect:  Congruent  Thought Process:  Goal Directed and Descriptions of Associations: Intact  Orientation:  Full (Time, Place, and Person)  Thought Content: Logical   Suicidal Thoughts:  No  Homicidal Thoughts:  No  Memory:  Immediate;   Fair Recent;   Fair Remote;   Fair  Judgement:  Fair  Insight:  Fair  Psychomotor Activity:  Normal  Concentration:  Concentration: Fair and Attention Span: Fair  Recall:  [employer] of Knowledge: Fair  Language: Fair  Akathisia:  No  Handed:  Right  AIMS (if indicated): denies tremors, rigidity  Assets:  Communication Skills Desire for Improvement Housing Social Support  ADL's:  Intact  Cognition: WNL  Sleep:  Restless due to having to take care of baby   Screenings:   Assessment and Plan: Queen is a 34 year old Caucasian female who has a history of MDD, GAD, ADD was evaluated by telemedicine today.  Patient is currently  struggling with attention and focus and will benefit from medication readjustment.  Plan as noted below.  Plan MDD in remission Lexapro 10 mg p.o. daily Trazodone 25 to 50 mg p.o. nightly as needed  GAD-stable Lexapro 10 mg p.o. daily  ADHD-unstable Increase Adderall to 30 mg p.o. daily in divided dosage. Reviewed [place] controlled substance database.  Follow-up in clinic in 4 weeks or sooner if needed.  February 19 at 9:40 AM  I have spent atleast 20 minutes non face to face with patient today. More than 50 % of the time was spent for ordering medications and test ,psychoeducation and supportive psychotherapy and care coordination,as well as documenting clinical information in electronic health record. This note was generated in part or whole with voice recognition software. Voice recognition is usually quite accurate but there are transcription errors that can and very often do occur. I apologize for any typographical errors that were not detected and corrected.       Jomarie Longs, MD 10/04/2019, 12:20 PM

## 2019-11-01 ENCOUNTER — Ambulatory Visit (INDEPENDENT_AMBULATORY_CARE_PROVIDER_SITE_OTHER): Payer: Self-pay | Admitting: Psychiatry

## 2019-11-01 ENCOUNTER — Other Ambulatory Visit: Payer: Self-pay

## 2019-11-01 DIAGNOSIS — Z5329 Procedure and treatment not carried out because of patient's decision for other reasons: Secondary | ICD-10-CM

## 2019-11-01 NOTE — Progress Notes (Signed)
No response to call or text. 

## 2019-11-08 ENCOUNTER — Telehealth: Payer: Self-pay

## 2019-11-08 DIAGNOSIS — F900 Attention-deficit hyperactivity disorder, predominantly inattentive type: Secondary | ICD-10-CM

## 2019-11-08 MED ORDER — AMPHETAMINE-DEXTROAMPHETAMINE 20 MG PO TABS: 30.0000 mg | ORAL_TABLET | Freq: Every day | ORAL | 0 refills | Status: DC

## 2019-11-08 NOTE — Telephone Encounter (Signed)
Patient called requesting a refill on her Adderall 20mg  to be sent to [facility] on [address] in [place]. She has a scheduled appointment on 11/20/19. Thank you.

## 2019-11-08 NOTE — Telephone Encounter (Signed)
Sent Adderall to pharmacy

## 2019-11-20 ENCOUNTER — Other Ambulatory Visit: Payer: Self-pay

## 2019-11-20 ENCOUNTER — Ambulatory Visit (INDEPENDENT_AMBULATORY_CARE_PROVIDER_SITE_OTHER): Payer: Self-pay | Admitting: Psychiatry

## 2019-11-20 DIAGNOSIS — Z5329 Procedure and treatment not carried out because of patient's decision for other reasons: Secondary | ICD-10-CM

## 2019-11-20 NOTE — Progress Notes (Signed)
No response to call or text at the time of appointment. Patient presented late - around 30 minutes late by Doxy .

## 2019-11-28 ENCOUNTER — Other Ambulatory Visit: Payer: Self-pay

## 2019-11-28 ENCOUNTER — Encounter: Payer: Self-pay | Admitting: Psychiatry

## 2019-11-28 ENCOUNTER — Ambulatory Visit (INDEPENDENT_AMBULATORY_CARE_PROVIDER_SITE_OTHER): Payer: Self-pay | Admitting: Psychiatry

## 2019-11-28 DIAGNOSIS — F900 Attention-deficit hyperactivity disorder, predominantly inattentive type: Secondary | ICD-10-CM

## 2019-11-28 DIAGNOSIS — F411 Generalized anxiety disorder: Secondary | ICD-10-CM

## 2019-11-28 DIAGNOSIS — F3342 Major depressive disorder, recurrent, in full remission: Secondary | ICD-10-CM | POA: Insufficient documentation

## 2019-11-28 MED ORDER — AMPHETAMINE-DEXTROAMPHETAMINE 20 MG PO TABS: 30.0000 mg | ORAL_TABLET | Freq: Every day | ORAL | 0 refills | Status: DC

## 2019-11-28 NOTE — Progress Notes (Signed)
Provider Location : [facility] Patient Location : Home  Virtual Visit via Video Note  I connected with Kimberly Knapp on 11/28/19 at  2:40 PM EDT by a video enabled telemedicine application and verified that I am speaking with the correct person using two identifiers.   I discussed the limitations of evaluation and management by telemedicine and the availability of in person appointments. The patient expressed understanding and agreed to proceed.    I discussed the assessment and treatment plan with the patient. The patient was provided an opportunity to ask questions and all were answered. The patient agreed with the plan and demonstrated an understanding of the instructions.   The patient was advised to call back or seek an in-person evaluation if the symptoms worsen or if the condition fails to improve as anticipated.  [place] MD OP Progress Note  11/28/2019 5:04 PM Kimberly Knapp  MRN:  [id number]  Chief Complaint:  Chief Complaint    Follow-up     HPI: Kimberly Knapp is a 34 year old female, married, lives in [place], currently unemployed, has a history of MDD, GAD, ADHD was evaluated by telemedicine today.  Patient today reports she is currently making progress with regards to her mood symptoms.  She is able to better manage her anxiety symptoms.  Her daughter is currently back at school and that does help her a lot.  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  She reports sleep is good.  Her baby sleeps through the night and lets her sleep as well.  She is better able to focus and able to complete her chores around the house.  The Adderall at this dosage is helpful.  She denies side effects.  Patient denies any suicidality, homicidality or perceptual disturbances.  Patient denies any other concerns today. Visit Diagnosis:    ICD-10-CM   1. MDD (major depressive disorder), recurrent, in full remission ([place])  F33.42   2. GAD (generalized  anxiety disorder)  F41.1   3. Attention deficit hyperactivity disorder (ADHD), predominantly inattentive type  F90.0 amphetamine-dextroamphetamine (ADDERALL) 20 MG tablet    amphetamine-dextroamphetamine (ADDERALL) 20 MG tablet    Past Psychiatric History: I have reviewed past psychiatric history from my progress note on 10/13/2017.  Past trials of Zoloft, Prozac, Celexa, Wellbutrin, Adderall  Past Medical History:  Past Medical History:  Diagnosis Date  . ADHD (attention deficit hyperactivity disorder)   . Depression   . Goiter   . Herpes genitalis   . Obesity (BMI 30.0-34.9)   . Pregnancy induced hypertension     Past Surgical History:  Procedure Laterality Date  . BREAST ENHANCEMENT SURGERY Bilateral   . CESAREAN SECTION N/A 05/29/2015   Procedure: CESAREAN SECTION;  Surgeon: Honor Loh [name], MD;  Location: [facility] ORS;  Service: Obstetrics;  Laterality: N/A;  . CESAREAN SECTION N/A 06/25/2018   Procedure: CESAREAN SECTION, repeat;  Surgeon: [name], Honor Loh, MD;  Location: [facility] ORS;  Service: Obstetrics;  Laterality: N/A;  . DILATION AND CURETTAGE OF UTERUS    . THYROID SURGERY     thyroid biopsy  . WISDOM TOOTH EXTRACTION      Family Psychiatric History: I have reviewed family psychiatric history from my progress note on 10/13/2017  Family History:  Family History  Problem Relation Age of Onset  . Diabetes type II Other   . Lung cancer Other   . Heart disease Other   . Lung cancer Other   .  ADD / ADHD Father   . Alcohol abuse Maternal Grandfather     Social History: Reviewed social history from my progress note on 10/13/2017 Social History   Socioeconomic History  . Marital status: Married    Spouse name: michael   . Number of children: 3  . Years of education: Not on file  . Highest education level: Associate degree: occupational, Scientist, product/process development, or vocational program  Occupational History  . 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 Smoking status:  Former Smoker    Types: Cigarettes    Quit date: 10/13/2017    Years since quitting: 2.1  . Smokeless tobacco: Never Used  Substance and Sexual Activity  . Alcohol use: Yes    Alcohol/week: 1.0 standard drinks    Types: 1 Glasses of wine per week    Comment: occasional   . Drug use: No  . Sexual activity: Yes    Partners: Male    Birth control/protection: None, I.U.D.  Other Topics Concern  . Not on file  Social History Narrative  . Not on file   Social Determinants of Health   Financial Resource Strain:   . Difficulty of Paying Living Expenses:   Food Insecurity:   . Worried About Programme researcher, broadcasting/film/video in the Last Year:   . Barista in the Last Year:   Transportation Needs:   . Freight forwarder (Medical):   Marland Kitchen Lack of Transportation (Non-Medical):   Physical Activity:   . Days of Exercise per Week:   . Minutes of Exercise per Session:   Stress:   . Feeling of Stress :   Social Connections:   . Frequency of Communication with Friends and Family:   . Frequency of Social Gatherings with Friends and Family:   . Attends Religious Services:   . Active Member of Clubs or Organizations:   . Attends Banker Meetings:   Marland Kitchen Marital Status:     Allergies:  Allergies  Allergen Reactions  . Iodine Swelling  . Shrimp [Shellfish Allergy] Swelling    Metabolic Disorder Labs: No results found for: HGBA1C, MPG No results found for: PROLACTIN No results found for: CHOL, TRIG, HDL, CHOLHDL, VLDL, LDLCALC Lab Results  Component Value Date   TSH 0.423 (L) 09/26/2017    Therapeutic Level Labs: No results found for: LITHIUM No results found for: VALPROATE No components found for:  CBMZ  Current Medications: Current Outpatient Medications  Medication Sig Dispense Refill  . [START ON 12/07/2019] amphetamine-dextroamphetamine (ADDERALL) 20 MG tablet Take 1.5 tablets (30 mg total) by mouth daily. Take one tablet AM and half tablet PM 45 tablet 0  . [START ON  01/04/2020] amphetamine-dextroamphetamine (ADDERALL) 20 MG tablet Take 1.5 tablets (30 mg total) by mouth daily. Take one tablet AM and half tablet PM. 45 tablet 0  . escitalopram (LEXAPRO) 10 MG tablet Take 1 tablet (10 mg total) by mouth daily. 90 tablet 1  . traZODone (DESYREL) 50 MG tablet TAKE 0.5-1 TABLETS (25-50 MG TOTAL) BY MOUTH AT BEDTIME AS NEEDED FOR SLEEP. 90 tablet 1   No current facility-administered medications for this visit.     Musculoskeletal: Strength & Muscle Tone: UTA Gait & Station: normal Patient leans: N/A  Psychiatric Specialty Exam: Review of Systems  Psychiatric/Behavioral: Negative for agitation, behavioral problems, confusion, decreased concentration, dysphoric mood, hallucinations, self-injury, sleep disturbance and suicidal ideas. The patient is not nervous/anxious and is not hyperactive.   All other systems  reviewed and are negative.   not currently breastfeeding.There is no height or weight on file to calculate BMI.  General Appearance: Casual  Eye Contact:  Fair  Speech:  Clear and Coherent  Volume:  Normal  Mood:  Euthymic  Affect:  Congruent  Thought Process:  Goal Directed and Descriptions of Associations: Intact  Orientation:  Full (Time, Place, and Person)  Thought Content: Logical   Suicidal Thoughts:  No  Homicidal Thoughts:  No  Memory:  Immediate;   Fair Recent;   Fair Remote;   Fair  Judgement:  Fair  Insight:  Fair  Psychomotor Activity:  Normal  Concentration:  Concentration: Fair and Attention Span: Fair  Recall:  [employer] of Knowledge: Fair  Language: Fair  Akathisia:  No  Handed:  Right  AIMS (if indicated): UTA  Assets:  Communication Skills Desire for Improvement Housing Social Support  ADL's:  Intact  Cognition: WNL  Sleep:  Fair   Screenings:   Assessment and Plan: [place] is a 34 year old Caucasian female who has a history of MDD, GAD, ADD was evaluated by telemedicine today.  Patient is currently making  progress with regards to her mood symptoms and attention and focus.  Plan as noted below.  Plan MDD in remission Lexapro 10 mg p.o. daily Trazodone 25 to 50 mg p.o. nightly as needed .  She does not use the trazodone much.  GAD-stable Lexapro 10 mg p.o. daily  ADHD-stable Adderall 30 mg p.o. daily divided dosage Reviewed [place] controlled substance database. I have provided 2 prescriptions with date specified last 1 to be filled on or after 01/04/2020.  Follow-up in clinic in 8 weeks or sooner if needed.  I have spent atleast 20 minutes non face to face with patient today. More than 50 % of the time was spent for preparing to see the patient ( e.g., review of test, records ), ordering medications and test ,psychoeducation and supportive psychotherapy and care coordination,as well as documenting clinical information in electronic health record. This note was generated in part or whole with voice recognition software. Voice recognition is usually quite accurate but there are transcription errors that can and very often do occur. I apologize for any typographical errors that were not detected and corrected.       Jomarie Longs, MD 11/28/2019, 5:04 PM

## 2020-01-30 ENCOUNTER — Telehealth (INDEPENDENT_AMBULATORY_CARE_PROVIDER_SITE_OTHER): Payer: Self-pay | Admitting: Psychiatry

## 2020-01-30 ENCOUNTER — Other Ambulatory Visit: Payer: Self-pay

## 2020-01-30 DIAGNOSIS — Z5329 Procedure and treatment not carried out because of patient's decision for other reasons: Secondary | ICD-10-CM

## 2020-01-30 NOTE — Progress Notes (Signed)
No response to call or text. 

## 2020-02-05 ENCOUNTER — Encounter: Payer: Self-pay | Admitting: Psychiatry

## 2020-02-05 ENCOUNTER — Telehealth (INDEPENDENT_AMBULATORY_CARE_PROVIDER_SITE_OTHER): Payer: Self-pay | Admitting: Psychiatry

## 2020-02-05 ENCOUNTER — Other Ambulatory Visit: Payer: Self-pay

## 2020-02-05 DIAGNOSIS — F411 Generalized anxiety disorder: Secondary | ICD-10-CM

## 2020-02-05 DIAGNOSIS — F900 Attention-deficit hyperactivity disorder, predominantly inattentive type: Secondary | ICD-10-CM

## 2020-02-05 DIAGNOSIS — F3342 Major depressive disorder, recurrent, in full remission: Secondary | ICD-10-CM

## 2020-02-05 MED ORDER — AMPHETAMINE-DEXTROAMPHETAMINE 20 MG PO TABS: 30.0000 mg | ORAL_TABLET | Freq: Every day | ORAL | 0 refills | Status: DC

## 2020-02-05 MED ORDER — ESCITALOPRAM OXALATE 10 MG PO TABS: 15.0000 mg | ORAL_TABLET | Freq: Every day | ORAL | 1 refills | Status: DC

## 2020-02-05 MED ORDER — ESCITALOPRAM OXALATE 20 MG PO TABS: 20.0000 mg | ORAL_TABLET | Freq: Every day | ORAL | 0 refills | Status: DC

## 2020-02-05 MED ORDER — HYDROXYZINE HCL 25 MG PO TABS: 25.0000 mg | ORAL_TABLET | Freq: Every day | ORAL | 1 refills | Status: DC | PRN

## 2020-02-05 NOTE — Progress Notes (Signed)
Provider Location : [facility] Patient Location : Home  Virtual Visit via Video Note  I connected with Kimberly Knapp on 02/05/20 at  2:30 PM EDT by a video enabled telemedicine application and verified that I am speaking with the correct person using two identifiers.   I discussed the limitations of evaluation and management by telemedicine and the availability of in person appointments. The patient expressed understanding and agreed to proceed.   I discussed the assessment and treatment plan with the patient. The patient was provided an opportunity to ask questions and all were answered. The patient agreed with the plan and demonstrated an understanding of the instructions.   The patient was advised to call back or seek an in-person evaluation if the symptoms worsen or if the condition fails to improve as anticipated.   [facility] MD OP Progress Note  02/05/2020 2:59 PM Kimberly Knapp  MRN:  [id number]  Chief Complaint:  Chief Complaint    Follow-up     HPI: Kimberly Knapp is a 34 year old female, married, lives in [place], currently unemployed, has a history of MDD, GAD, ADHD was evaluated by telemedicine today.  Patient today reports she is currently feeling overwhelmed with situational stressors.  She reports there is a lot going on with the children.  She reports she has to run around taking them for softball games and other activities and also make dinner and taking care of other chores around the house.  She reports she has had moments when she felt overwhelmed and stressed out.  She wonders whether her medications can be readjusted to help her to cope with it.  Patient reports she is compliant on the Adderall and that does help her with her attention and focus.  She denies side effects.  She reports sleep continues to be good.  Patient reports she recently got sick with bronchitis and was prescribed antibiotic and prednisone.  She reports she has a week more to go with the medication.   She currently feels better.  Patient denies any suicidality, homicidality or perceptual disturbances.  She is currently not planning to go back to work at least until the end of summer.  She reports it is difficult to get childcare and she cannot depend on her mother in law during the summer, so she is planning to wait.  Patient denies any other concerns today.  Visit Diagnosis:    ICD-10-CM   1. MDD (major depressive disorder), recurrent, in full remission ([facility])  F33.42 escitalopram (LEXAPRO) 10 MG tablet    escitalopram (LEXAPRO) 20 MG tablet  2. GAD (generalized anxiety disorder)  F41.1 hydrOXYzine (ATARAX/VISTARIL) 25 MG tablet    escitalopram (LEXAPRO) 20 MG tablet  3. Attention deficit hyperactivity disorder (ADHD), predominantly inattentive type  F90.0 amphetamine-dextroamphetamine (ADDERALL) 20 MG tablet    amphetamine-dextroamphetamine (ADDERALL) 20 MG tablet    Past Psychiatric History: I have reviewed past psychiatric history from my progress note on 10/13/2017.  Past trials of Zoloft, Prozac, Celexa, Wellbutrin, Adderall  Past Medical History:  Past Medical History:  Diagnosis Date  . ADHD (attention deficit hyperactivity disorder)   . Depression   . Goiter   . Herpes genitalis   . Obesity (BMI 30.0-34.9)   . Pregnancy induced hypertension     Past Surgical History:  Procedure Laterality Date  . BREAST ENHANCEMENT SURGERY Bilateral   . CESAREAN SECTION N/A 05/29/2015   Procedure: CESAREAN SECTION;  Surgeon: [name], MD;  Location: [facility] OR[facility];  Service: Obstetrics;  Laterality: N/A;  . CESAREAN SECTION N/A 06/25/2018   Procedure: CESAREAN SECTION, repeat;  Surgeon: [name], Elenora Fender, MD;  Location: [facility] OR[facility];  Service: Obstetrics;  Laterality: N/A;  . DILATION AND CURETTAGE OF UTERUS    . THYROID SURGERY     thyroid biopsy  . WISDOM TOOTH EXTRACTION      Family Psychiatric History: I have reviewed family psychiatric history from my progress note on  10/13/2017  Family History:  Family History  Problem Relation Age of Onset  . Diabetes type II Other   . Lung cancer Other   . Heart disease Other   . Lung cancer Other   . ADD / ADHD Father   . Alcohol abuse Maternal Grandfather     Social History : I have reviewed social history from my progress note on 10/13/2017 Social History   Socioeconomic History  . Marital status: Married    Spouse name: michael   . Number of children: 3  . Years of education: Not on file  . Highest education level: Associate degree: occupational, Scientist, product/process development, or vocational program  Occupational History  . 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 Smoking status: Former Smoker    Types: Cigarettes    Quit date: 10/13/2017    Years since quitting: 2.3  . Smokeless tobacco: Never Used  Substance and Sexual Activity  . Alcohol use: Yes    Alcohol/week: 1.0 standard drinks    Types: 1 Glasses of wine per week    Comment: occasional   . Drug use: No  . Sexual activity: Yes    Partners: Male    Birth control/protection: None, I.U.D.  Other Topics Concern  . Not on file  Social History Narrative  . Not on file   Social Determinants of Health   Financial Resource Strain:   . Difficulty of Paying Living Expenses:   Food Insecurity:   . Worried About Programme researcher, broadcasting/film/video in the Last Year:   . Barista in the Last Year:   Transportation Needs:   . Freight forwarder (Medical):   Marland Kitchen Lack of Transportation (Non-Medical):   Physical Activity:   . Days of Exercise per Week:   . Minutes of Exercise per Session:   Stress:   . Feeling of Stress :   Social Connections:   . Frequency of Communication with Friends and Family:   . Frequency of Social Gatherings with Friends and Family:   . Attends Religious Services:   . Active Member of Clubs or Organizations:   . Attends Banker Meetings:   Marland Kitchen Marital Status:     Allergies:  Allergies  Allergen Reactions  .  Iodine Swelling  . Shrimp [Shellfish Allergy] Swelling    Metabolic Disorder Labs: No results found for: HGBA1C, MPG No results found for: PROLACTIN No results found for: CHOL, TRIG, HDL, CHOLHDL, VLDL, LDLCALC Lab Results  Component Value Date   TSH 0.423 (L) 09/26/2017    Therapeutic Level Labs: No results found for: LITHIUM No results found for: VALPROATE No components found for:  CBMZ  Current Medications: Current Outpatient Medications  Medication Sig Dispense Refill  . amphetamine-dextroamphetamine (ADDERALL) 20 MG tablet Take 1.5 tablets (30 mg total) by mouth daily. Take one tablet AM and half tablet PM 45 tablet 0  . [START ON 03/05/2020] amphetamine-dextroamphetamine (ADDERALL) 20 MG tablet Take 1.5 tablets (30 mg total) by mouth daily. Take one tablet AM and half tablet  PM. 45 tablet 0  . benzonatate (TESSALON) 200 MG capsule Take by mouth.    . escitalopram (LEXAPRO) 10 MG tablet Take 1.5 tablets (15 mg total) by mouth daily. 90 tablet 1  . fluconazole (DIFLUCAN) 150 MG tablet Take 150 mg by mouth once.    Marland Kitchen ipratropium (ATROVENT) 0.06 % nasal spray Place 2 sprays into both nostrils 2 (two) times daily.    Marland Kitchen moxifloxacin (AVELOX) 400 MG tablet Take 400 mg by mouth daily.    . predniSONE (DELTASONE) 20 MG tablet Take 20 mg by mouth 2 (two) times daily.    . predniSONE (DELTASONE) 20 MG tablet Take by mouth.    . traZODone (DESYREL) 50 MG tablet TAKE 0.5-1 TABLETS (25-50 MG TOTAL) BY MOUTH AT BEDTIME AS NEEDED FOR SLEEP. 90 tablet 1  . escitalopram (LEXAPRO) 20 MG tablet Take 1 tablet (20 mg total) by mouth daily. 90 tablet 0  . hydrOXYzine (ATARAX/VISTARIL) 25 MG tablet Take 1 tablet (25 mg total) by mouth daily as needed. For severe anxiety attacks only 30 tablet 1   No current facility-administered medications for this visit.     Musculoskeletal: Strength & Muscle Tone: UTA Gait & Station: normal Patient leans: N/A  Psychiatric Specialty Exam: Review of Systems   HENT: Positive for congestion (improving).   Respiratory: Positive for cough (improving).   Psychiatric/Behavioral: The patient is nervous/anxious.   All other systems reviewed and are negative.   not currently breastfeeding.There is no height or weight on file to calculate BMI.  General Appearance: Casual  Eye Contact:  Fair  Speech:  Clear and Coherent  Volume:  Normal  Mood:  Anxious  Affect:  Congruent  Thought Process:  Goal Directed and Descriptions of Associations: Intact  Orientation:  Full (Time, Place, and Person)  Thought Content: Logical   Suicidal Thoughts:  No  Homicidal Thoughts:  No  Memory:  Immediate;   Fair Recent;   Fair Remote;   Fair  Judgement:  Fair  Insight:  Fair  Psychomotor Activity:  Normal  Concentration:  Concentration: Fair and Attention Span: Fair  Recall:  [employer] of Knowledge: Fair  Language: Fair  Akathisia:  No  Handed:  Right  AIMS (if indicated): UTA  Assets:  Communication Skills Desire for Improvement Social Support  ADL's:  Intact  Cognition: WNL  Sleep:  Fair   Screenings:   Assessment and Plan: MAUREENA DABBS is a 34 year old Caucasian female who has a history of MDD, GAD, ADD was evaluated by telemedicine today.  Patient is currently struggling with anxiety symptoms and will benefit from the following medication changes.  Plan as noted below.  Plan MDD in remission Lexapro as prescribed Trazodone 25 to 50 mg p.o. nightly as needed.  She rarely uses it.  GAD-unstable Increase Lexapro to 15 mg p.o. daily for a week and then to 20 mg p.o. daily after that. She is currently on moxifloxacin as well as prednisone and hence advised patient to only gradually increase the Lexapro and also to monitor side effects for drug to drug interaction.  ADHD-stable Adderall 30 mg p.o. daily divided dosage. I have reviewed [place] controlled substance database. I have provided 2 prescriptions with date specified last one to be filled on  or after July 7 at 10 AM  Follow-up in clinic in 5 to 6 weeks or sooner if needed.  I have spent atleast 20 minutes non face to face with patient today. More than 50 % of the  time was spent for preparing to see the patient ( e.g., review of test, records ), obtaining and to review and separately obtained history , ordering medications and test ,psychoeducation and supportive psychotherapy and care coordination,as well as documenting clinical information in electronic health record. This note was generated in part or whole with voice recognition software. Voice recognition is usually quite accurate but there are transcription errors that can and very often do occur. I apologize for any typographical errors that were not detected and corrected.        Ursula Alert, MD 02/05/2020, 2:59 PM

## 2020-02-06 ENCOUNTER — Encounter: Payer: Self-pay | Admitting: Emergency Medicine

## 2020-02-06 ENCOUNTER — Emergency Department
Admission: EM | Admit: 2020-02-06 | Discharge: 2020-02-06 | Disposition: A | Discharge status: Home / Self Care | Payer: 59 | Attending: Emergency Medicine | Admitting: Emergency Medicine

## 2020-02-06 ENCOUNTER — Emergency Department: Payer: 59

## 2020-02-06 ENCOUNTER — Other Ambulatory Visit: Payer: Self-pay

## 2020-02-06 DIAGNOSIS — R002 Palpitations: Secondary | ICD-10-CM | POA: Insufficient documentation

## 2020-02-06 DIAGNOSIS — R0789 Other chest pain: Secondary | ICD-10-CM | POA: Diagnosis not present

## 2020-02-06 LAB — CBC
HCT: 40.8 % (ref 36.0–46.0)
Hemoglobin: 13.9 g/dL (ref 12.0–15.0)
MCH: 31.2 pg (ref 26.0–34.0)
MCHC: 34.1 g/dL (ref 30.0–36.0)
MCV: 91.7 fL (ref 80.0–100.0)
Platelets: 339 10*3/uL (ref 150–400)
RBC: 4.45 MIL/uL (ref 3.87–5.11)
RDW: 12.5 % (ref 11.5–15.5)
WBC: 8.5 10*3/uL (ref 4.0–10.5)
nRBC: 0 % (ref 0.0–0.2)

## 2020-02-06 LAB — BASIC METABOLIC PANEL
Anion gap: 7 (ref 5–15)
BUN: 14 mg/dL (ref 6–20)
CO2: 23 mmol/L (ref 22–32)
Calcium: 8.8 mg/dL — ABNORMAL LOW (ref 8.9–10.3)
Chloride: 111 mmol/L (ref 98–111)
Creatinine, Ser: 0.7 mg/dL (ref 0.44–1.00)
GFR calc Af Amer: >60 mL/min (ref >60)
GFR calc non Af Amer: >60 mL/min (ref >60)
Glucose, Bld: 98 mg/dL (ref 70–99)
Potassium: 3.5 mmol/L (ref 3.5–5.1)
Sodium: 141 mmol/L (ref 135–145)

## 2020-02-06 LAB — POCT PREGNANCY, URINE: Preg Test, Ur: NEGATIVE

## 2020-02-06 LAB — TROPONIN I (HIGH SENSITIVITY)
Troponin I (High Sensitivity): 4 ng/L (ref <18)
Troponin I (High Sensitivity): 9 ng/L (ref <18)

## 2020-02-06 NOTE — ED Triage Notes (Signed)
Patient presents to the ED with chest heaviness and palpitations.  Patient states she is being treated for bronchitis and has been taking steroids and antibiotics as well as adderrall that she takes normally and also anxiety medication.  Patient reports history of anxiety and states, "I don't know if this is my anxiety or what but I didn't want to take a chance."  Patient denies diaphoresis, nausea, and dizziness.  Patient denies any new shortness of breath other than from her bronchitis.

## 2020-02-06 NOTE — ED Provider Notes (Signed)
[facility] Emergency Department Provider Note  ____________________________________________   First MD Initiated Contact with Patient 02/06/20 1609     (approximate)  I have reviewed the triage vital signs and the nursing notes.   HISTORY  Chief Complaint Chest Pain and Palpitations    HPI Kimberly Knapp is a 34 y.o. female with past medical history as below here with palpitations.  The patient states that she recently has been started on prednisone as well as multiple courses of antibiotics for her bronchitis.  She has had a chronic cough.  She states that over the last several days, she has noticed some intermittent but stabbing as well as intermittent heavy sensation in her chest.  She noticed that she was prescribed a flora quinolone and that this along with her Escitalopram could cause QT prolongation.  She subsequently decided to present for further evaluation.  She does describe some occasional irregular heartbeats and sensation like her heart is catching.  She has some mild shortness of breath with this.  No vomiting.  No diarrhea.  No diaphoresis.  No history of A. fib or SVT or arrhythmia.  No family history of arrhythmia.  No other acute complaints.        Past Medical History:  Diagnosis Date  . ADHD (attention deficit hyperactivity disorder)   . Depression   . Goiter   . Herpes genitalis   . Obesity (BMI 30.0-34.9)   . Pregnancy induced hypertension     Patient Active Problem List   Diagnosis Date Noted  . MDD (major depressive disorder), recurrent, in full remission (HCC) 11/28/2019  . Pelvic cramping 07/04/2019  . MDD (major depressive disorder), recurrent episode, mild (HCC) 04/18/2019  . GAD (generalized anxiety disorder) 04/18/2019  . Breech presentation, antepartum 06/18/2018  . History of cesarean delivery, currently pregnant 06/18/2018  . Labor and delivery indication for care or intervention 06/08/2018  . Elevated blood  pressure affecting pregnancy in third trimester, antepartum 05/31/2018  . Obesity affecting pregnancy in first trimester 12/26/2017  . Attention deficit hyperactivity disorder (ADHD), predominantly inattentive type 09/26/2017  . Supervision of high-risk pregnancy 05/28/2015  . Gestational hypertension w/o significant proteinuria in 3rd trimester 05/28/2015  . Herpes genitalis     Past Surgical History:  Procedure Laterality Date  . BREAST ENHANCEMENT SURGERY Bilateral   . CESAREAN SECTION N/A 05/29/2015   Procedure: CESAREAN SECTION;  Surgeon: [name], MD;  Location: [facility] ORS;  Service: Obstetrics;  Laterality: N/A;  . CESAREAN SECTION N/A 06/25/2018   Procedure: CESAREAN SECTION, repeat;  Surgeon: [name], Elenora Fender, MD;  Location: [facility] ORS;  Service: Obstetrics;  Laterality: N/A;  . DILATION AND CURETTAGE OF UTERUS    . THYROID SURGERY     thyroid biopsy  . WISDOM TOOTH EXTRACTION      Prior to Admission medications   Medication Sig Start Date End Date Taking? Authorizing Provider  amphetamine-dextroamphetamine (ADDERALL) 20 MG tablet Take 1.5 tablets (30 mg total) by mouth daily. Take one tablet AM and half tablet PM 02/05/20   Eappen, Levin Bacon, MD  amphetamine-dextroamphetamine (ADDERALL) 20 MG tablet Take 1.5 tablets (30 mg total) by mouth daily. Take one tablet AM and half tablet PM. 03/05/20   Jomarie Longs, MD  benzonatate (TESSALON) 200 MG capsule Take by mouth. 02/02/20 02/09/20  [provider]  escitalopram (LEXAPRO) 20 MG tablet Take 1 tablet (20 mg total) by mouth daily. 02/05/20   Jomarie Longs, MD  fluconazole (DIFLUCAN) 150 MG tablet Take  150 mg by mouth once. 02/02/20   [provider]  hydrOXYzine (ATARAX/VISTARIL) 25 MG tablet Take 1 tablet (25 mg total) by mouth daily as needed. For severe anxiety attacks only 02/05/20   Jomarie Longs, MD  ipratropium (ATROVENT) 0.06 % nasal spray Place 2 sprays into both nostrils 2 (two) times daily. 01/20/20    [provider]  moxifloxacin (AVELOX) 400 MG tablet Take 400 mg by mouth daily. 02/02/20   [provider]  predniSONE (DELTASONE) 20 MG tablet Take 20 mg by mouth 2 (two) times daily. 02/02/20   [provider]  predniSONE (DELTASONE) 20 MG tablet Take by mouth. 02/02/20 02/07/20  [provider]  traZODone (DESYREL) 50 MG tablet TAKE 0.5-1 TABLETS (25-50 MG TOTAL) BY MOUTH AT BEDTIME AS NEEDED FOR SLEEP. 08/05/19   Jomarie Longs, MD    Allergies Iodine and Shrimp [shellfish allergy]  Family History  Problem Relation Age of Onset  . Diabetes type II Other   . Lung cancer Other   . Heart disease Other   . Lung cancer Other   . ADD / ADHD Father   . Alcohol abuse Maternal Grandfather     Social History Social History   Tobacco Use  . Smoking status: Former Smoker    Types: Cigarettes    Quit date: 10/13/2017    Years since quitting: 2.3  . Smokeless tobacco: Never Used  Substance Use Topics  . Alcohol use: Yes    Alcohol/week: 1.0 standard drinks    Types: 1 Glasses of wine per week    Comment: occasional   . Drug use: No    Review of Systems  Review of Systems  Constitutional: Positive for fatigue. Negative for fever.  HENT: Negative for congestion and sore throat.   Eyes: Negative for visual disturbance.  Respiratory: Positive for chest tightness and shortness of breath. Negative for cough.   Cardiovascular: Positive for palpitations. Negative for chest pain.  Gastrointestinal: Negative for abdominal pain, diarrhea, nausea and vomiting.  Genitourinary: Negative for flank pain.  Musculoskeletal: Negative for back pain and neck pain.  Skin: Negative for rash and wound.  Neurological: Negative for weakness.  All other systems reviewed and are negative.    ____________________________________________  PHYSICAL EXAM:      VITAL SIGNS: ED Triage Vitals  Enc Vitals Group     BP 02/06/20 1027 131/84     Pulse Rate 02/06/20 1027 84      Resp 02/06/20 1027 16     Temp 02/06/20 1027 98.7 F (37.1 C)     Temp Source 02/06/20 1027 Oral     SpO2 02/06/20 1027 98 %     Weight 02/06/20 1028 240 lb (108.9 kg)     Height 02/06/20 1028 5\' 8"  (1.727 m)     Head Circumference --      Peak Flow --      Pain Score 02/06/20 1027 4     Pain Loc --      Pain Edu? --      Excl. in GC? --      Physical Exam Vitals and nursing note reviewed.  Constitutional:      General: She is not in acute distress.    Appearance: She is well-developed.  HENT:     Head: Normocephalic and atraumatic.  Eyes:     Conjunctiva/sclera: Conjunctivae normal.  Cardiovascular:     Rate and Rhythm: Normal rate and regular rhythm.     Heart sounds: Normal heart  sounds. No murmur. No friction rub.  Pulmonary:     Effort: Pulmonary effort is normal. No respiratory distress.     Breath sounds: Normal breath sounds. No wheezing or rales.  Abdominal:     General: There is no distension.     Palpations: Abdomen is soft.     Tenderness: There is no abdominal tenderness.  Musculoskeletal:     Cervical back: Neck supple.  Skin:    General: Skin is warm.     Capillary Refill: Capillary refill takes less than 2 seconds.  Neurological:     Mental Status: She is alert and oriented to person, place, and time.     Motor: No abnormal muscle tone.       ____________________________________________   LABS (all labs ordered are listed, but only abnormal results are displayed)  Labs Reviewed  BASIC METABOLIC PANEL - Abnormal; Notable for the following components:      Result Value   Calcium 8.8 (*)    All other components within normal limits  CBC  POCT PREGNANCY, URINE  POC URINE PREG, ED  TROPONIN I (HIGH SENSITIVITY)  TROPONIN I (HIGH SENSITIVITY)    ____________________________________________  EKG: Normal sinus rhythm with sinus arrhythmia.  Ventricular rate 77, PR 140, QRS 88, QTc 4 9.  No acute ST elevations or depressions.  ________________________________________  RADIOLOGY All imaging, including plain films, CT scans, and ultrasounds, independently reviewed by me, and interpretations confirmed via formal radiology reads.  ED MD interpretation:   Chest x-ray: No active disease  Official radiology report(s): DG Chest 2 View  Result Date: 02/06/2020 CLINICAL DATA:  Chest pain/pressure with arrhythmia type feeling since yesterday. EXAM: CHEST - 2 VIEW COMPARISON:  None. FINDINGS: Lungs are adequately inflated and otherwise clear. Cardiomediastinal silhouette, bones and soft tissues are normal. IMPRESSION: No active cardiopulmonary disease. Electronically Signed   By: Marin Olp M.D.   On: 02/06/2020 10:55    ____________________________________________  PROCEDURES   Procedure(s) performed (including Critical Care):  Procedures  ____________________________________________  INITIAL IMPRESSION / MDM / [place] / ED COURSE  As part of my medical decision making, I reviewed the following data within the [facility] notes reviewed and incorporated, Old chart reviewed, Notes from prior ED visits, and [place] Controlled Substance Database       *DEMMI SINDT was evaluated in Emergency Department on 02/06/2020 for the symptoms described in the history of present illness. She was evaluated in the context of the global COVID-19 pandemic, which necessitated consideration that the patient might be at risk for infection with the SARS-CoV-2 virus that causes COVID-19. Institutional protocols and algorithms that pertain to the evaluation of patients at risk for COVID-19 are in a state of rapid change based on information released by regulatory bodies including the CDC and federal and state organizations. These policies and algorithms were followed during the patient's care in the ED.  Some ED evaluations and interventions may be delayed as a result of limited staffing during the  pandemic.*     Medical Decision Making:  34 yo F here with mild palpitations and atypical chest pain, which I suspect is 2/2 symptomatic PVCs vs chest wall pain from coughing from her bronchitis. She has no tachycardia, tachypnea, or s/s to suggest PE and is PERC negative - doubt PE. Trop neg x 2 with normal EKG - doubt ACS. QT is normal and no arrhythmia noted on tele here, no signs of significant malignant arrythmia. Labs o/w  reassuring. Will have her hold her prednisone as she has no wheezing, normal WOB and no signs of significant ongoing bronchitis, and encourage her to hydrate, avoid caffeine, and possibly hold her adderrall x 1-2 days if possible. Otherwise, f/u with PCP.  ____________________________________________  FINAL CLINICAL IMPRESSION(S) / ED DIAGNOSES  Final diagnoses:  Palpitations  Atypical chest pain     MEDICATIONS GIVEN DURING THIS VISIT:  Medications - No data to display   ED Discharge Orders    None       Note:  This document was prepared using Dragon voice recognition software and may include unintentional dictation errors.   Shaune Pollack, MD 02/06/20 [phone]

## 2020-02-06 NOTE — Discharge Instructions (Addendum)
As we discussed, I suspect your symptoms are due to a combination of the prednisone and your Adderall.  For now, I would recommend stopping the prednisone as your lungs sound clear.  Drink plenty of fluids and avoid any excess caffeine.  Try to get some sleep.  If your symptoms do not improve after being off the prednisone, I would call your primary doctor to get set up with an outpatient cardiac/Holter monitor.  You may also have some GI upset related to the steroids, so you could consider taking Pepcid or Tums to help with some indigestion.

## 2020-03-18 ENCOUNTER — Telehealth (INDEPENDENT_AMBULATORY_CARE_PROVIDER_SITE_OTHER): Payer: 59 | Admitting: Psychiatry

## 2020-03-18 ENCOUNTER — Encounter: Payer: Self-pay | Admitting: Psychiatry

## 2020-03-18 ENCOUNTER — Other Ambulatory Visit: Payer: Self-pay

## 2020-03-18 DIAGNOSIS — F411 Generalized anxiety disorder: Secondary | ICD-10-CM

## 2020-03-18 DIAGNOSIS — F900 Attention-deficit hyperactivity disorder, predominantly inattentive type: Secondary | ICD-10-CM | POA: Diagnosis not present

## 2020-03-18 DIAGNOSIS — F3342 Major depressive disorder, recurrent, in full remission: Secondary | ICD-10-CM | POA: Diagnosis not present

## 2020-03-18 MED ORDER — AMPHETAMINE-DEXTROAMPHETAMINE 20 MG PO TABS: 30.0000 mg | ORAL_TABLET | Freq: Every day | ORAL | 0 refills | Status: DC

## 2020-03-18 NOTE — Progress Notes (Signed)
Provider Location : [facility] Patient Location : Home  Virtual Visit via Video Note  I connected with Kimberly Knapp on 03/18/20 at 10:00 AM EDT by a video enabled telemedicine application and verified that I am speaking with the correct person using two identifiers.   I discussed the limitations of evaluation and management by telemedicine and the availability of in person appointments. The patient expressed understanding and agreed to proceed.    I discussed the assessment and treatment plan with the patient. The patient was provided an opportunity to ask questions and all were answered. The patient agreed with the plan and demonstrated an understanding of the instructions.   The patient was advised to call back or seek an in-person evaluation if the symptoms worsen or if the condition fails to improve as anticipated.   [facility] Kimberly Knapp OP Progress Note  03/18/2020 10:28 AM Kimberly Knapp  MRN:  [id number]  Chief Complaint:  Chief Complaint    Follow-up     HPI: Kimberly Knapp is a 34 year old female, married, lives in [place] currently unemployed, has a history of MDD, GAD, ADHD was evaluated by telemedicine today.  Patient today reports she is currently doing well on the current medication regimen.  She denies any significant depression or anxiety.  She is tolerating the Lexapro well.  She reports sleep is good.  She rarely uses trazodone.  She continues to be compliant on the Adderall and reports attention and focus continues to be well.  She denies any side effects to the Adderall.  Patient reports she and her family are taking a vacation to [place] soon.  Her daughter will have to go back to school end of July since she goes to a charter school.  Patient denies any suicidality, homicidality or perceptual disturbances.  Patient denies any other concerns today.  Visit Diagnosis:    ICD-10-CM   1. MDD (major depressive disorder), recurrent, in full remission (HCC)  F33.42   2. GAD  (generalized anxiety disorder)  F41.1   3. Attention deficit hyperactivity disorder (ADHD), predominantly inattentive type  F90.0 amphetamine-dextroamphetamine (ADDERALL) 20 MG tablet    amphetamine-dextroamphetamine (ADDERALL) 20 MG tablet    amphetamine-dextroamphetamine (ADDERALL) 20 MG tablet    Past Psychiatric History: I have reviewed past psychiatric history from my progress note on 10/13/2017.  Past trials of Zoloft, Prozac, Celexa, Wellbutrin, Adderall  Past Medical History:  Past Medical History:  Diagnosis Date   ADHD (attention deficit hyperactivity disorder)    Depression    Goiter    Herpes genitalis    Obesity (BMI 30.0-34.9)    Pregnancy induced hypertension     Past Surgical History:  Procedure Laterality Date   BREAST ENHANCEMENT SURGERY Bilateral    CESAREAN SECTION N/A 05/29/2015   Procedure: CESAREAN SECTION;  Surgeon: Kimberly Knapp [name], Kimberly Knapp;  Location: [facility] ORS;  Service: Obstetrics;  Laterality: N/A;   CESAREAN SECTION N/A 06/25/2018   Procedure: CESAREAN SECTION, repeat;  Surgeon: Kimberly Knapp, Kimberly Fender, Kimberly Knapp;  Location: [facility] ORS;  Service: Obstetrics;  Laterality: N/A;   DILATION AND CURETTAGE OF UTERUS     THYROID SURGERY     thyroid biopsy   WISDOM TOOTH EXTRACTION      Family Psychiatric History: I have reviewed family psychiatric history from my progress note on 10/13/2017.  Family History:  Family History  Problem Relation Age of Onset   Diabetes type II Other    Lung cancer Other    Heart disease Other    Lung  cancer Other    ADD / ADHD Father    Alcohol abuse Maternal Grandfather     Social History: I have reviewed social history from my progress note on 10/13/2017. Social History   Socioeconomic History   Marital status: Married    Spouse name: michael    Number of children: 3   Years of education: Not on file   Highest education level: Associate degree: occupational, Scientist, product/process development, or vocational program  Occupational History    Occupation: [employer]    Comment: not employed  Tobacco Use   Smoking status: Former Smoker    Types: Cigarettes    Quit date: 10/13/2017    Years since quitting: 2.4   Smokeless tobacco: Never Used  Vaping Use   Vaping Use: Never used  Substance and Sexual Activity   Alcohol use: Yes    Alcohol/week: 1.0 standard drink    Types: 1 Glasses of wine per week    Comment: occasional    Drug use: No   Sexual activity: Yes    Partners: Male    Birth control/protection: None, I.U.D.  Other Topics Concern   Not on file  Social History Narrative   Not on file   Social Determinants of Health   Financial Resource Strain:    Difficulty of Paying Living Expenses:   Food Insecurity:    Worried About Programme researcher, broadcasting/film/video in the Last Year:    Barista in the Last Year:   Transportation Needs:    Freight forwarder (Medical):    Lack of Transportation (Non-Medical):   Physical Activity:    Days of Exercise per Week:    Minutes of Exercise per Session:   Stress:    Feeling of Stress :   Social Connections:    Frequency of Communication with Friends and Family:    Frequency of Social Gatherings with Friends and Family:    Attends Religious Services:    Active Member of Clubs or Organizations:    Attends Banker Meetings:    Marital Status:     Allergies:  Allergies  Allergen Reactions   Iodine Swelling   Shrimp [Shellfish Allergy] Swelling    Metabolic Disorder Labs: No results found for: HGBA1C, MPG No results found for: PROLACTIN No results found for: CHOL, TRIG, HDL, CHOLHDL, VLDL, LDLCALC Lab Results  Component Value Date   TSH 0.423 (L) 09/26/2017    Therapeutic Level Labs: No results found for: LITHIUM No results found for: VALPROATE No components found for:  CBMZ  Current Medications: Current Outpatient Medications  Medication Sig Dispense Refill   amphetamine-dextroamphetamine (ADDERALL) 20 MG tablet TAKE 1.5  TABLETS (30 MG TOTAL) BY MOUTH DAILY. TAKE ONE TABLET AM AND HALF TABLET PM.     [START ON 04/05/2020] amphetamine-dextroamphetamine (ADDERALL) 20 MG tablet Take 1.5 tablets (30 mg total) by mouth daily. Take one tablet AM and half tablet PM. 45 tablet 0   [START ON 05/04/2020] amphetamine-dextroamphetamine (ADDERALL) 20 MG tablet Take 1.5 tablets (30 mg total) by mouth daily. Take one tablet AM and half tablet PM 45 tablet 0   [START ON 06/02/2020] amphetamine-dextroamphetamine (ADDERALL) 20 MG tablet Take 1.5 tablets (30 mg total) by mouth daily. Take 1 tablet AM and half tablet PM 45 tablet 0   brompheniramine-pseudoephedrine-DM 30-2-10 MG/5ML syrup Take 5 mLs by mouth 3 (three) times daily as needed.     escitalopram (LEXAPRO) 20 MG tablet Take 1 tablet (20 mg total) by mouth daily. 90 tablet  0   fluconazole (DIFLUCAN) 150 MG tablet Take 150 mg by mouth once.     hydrOXYzine (ATARAX/VISTARIL) 25 MG tablet Take 1 tablet (25 mg total) by mouth daily as needed. For severe anxiety attacks only 30 tablet 1   ipratropium (ATROVENT) 0.06 % nasal spray Place 2 sprays into both nostrils 2 (two) times daily.     moxifloxacin (AVELOX) 400 MG tablet Take 400 mg by mouth daily.     predniSONE (DELTASONE) 20 MG tablet Take 20 mg by mouth 2 (two) times daily.     traZODone (DESYREL) 50 MG tablet TAKE 0.5-1 TABLETS (25-50 MG TOTAL) BY MOUTH AT BEDTIME AS NEEDED FOR SLEEP. 90 tablet 1   No current facility-administered medications for this visit.     Musculoskeletal: Strength & Muscle Tone: UTA Gait & Station: normal Patient leans: N/A  Psychiatric Specialty Exam: Review of Systems  Psychiatric/Behavioral: Negative for agitation, behavioral problems, confusion, decreased concentration, dysphoric mood, hallucinations, self-injury, sleep disturbance and suicidal ideas. The patient is not nervous/anxious and is not hyperactive.   All other systems reviewed and are negative.   not currently  breastfeeding.There is no height or weight on file to calculate BMI.  General Appearance: Casual  Eye Contact:  Fair  Speech:  Normal Rate  Volume:  Normal  Mood:  Euthymic  Affect:  Congruent  Thought Process:  Goal Directed and Descriptions of Associations: Intact  Orientation:  Full (Time, Place, and Person)  Thought Content: Logical   Suicidal Thoughts:  No  Homicidal Thoughts:  No  Memory:  Immediate;   Fair Recent;   Fair Remote;   Fair  Judgement:  Fair  Insight:  Fair  Psychomotor Activity:  Normal  Concentration:  Concentration: Fair and Attention Span: Fair  Recall:  [employer] of Knowledge: Fair  Language: Fair  Akathisia:  No  Handed:  Right  AIMS (if indicated): UTA  Assets:  Communication Skills Desire for Improvement Housing Social Support  ADL's:  Intact  Cognition: WNL  Sleep:  Fair   Screenings:   Assessment and Plan: SHALEN PETRAK is a 34 year old Caucasian female who has a history of MDD, GAD, ADD was evaluated by telemedicine today.  Patient is currently making progress with regards to her mood and attention and focus.  Plan as noted below.  Plan MDD in remission Lexapro as prescribed Trazodone 25 to 50 mg p.o. nightly as needed-he rarely uses it  GAD-stable Lexapro 20 mg p.o. daily.  ADHD-stable Adderall 30 mg p.o. daily in divided dosage I have reviewed  controlled substance database. I have provided 3 prescriptions-last 1 to be filled on or after 06/02/2020.   Follow-up in clinic in 3 months or sooner if needed.  I have spent atleast 20 minutes non face to face with patient today. More than 50 % of the time was spent for preparing to see the patient ( e.g., review of test, records ), ordering medications and test ,psychoeducation and supportive psychotherapy and care coordination,as well as documenting clinical information in electronic health record. This note was generated in part or whole with voice recognition software. Voice  recognition is usually quite accurate but there are transcription errors that can and very often do occur. I apologize for any typographical errors that were not detected and corrected.        Jomarie Longs, Kimberly Knapp 03/18/2020, 10:28 AM

## 2020-03-30 ENCOUNTER — Other Ambulatory Visit: Payer: Self-pay | Admitting: Psychiatry

## 2020-03-30 DIAGNOSIS — F411 Generalized anxiety disorder: Secondary | ICD-10-CM

## 2020-04-13 ENCOUNTER — Other Ambulatory Visit: Payer: Self-pay | Admitting: Child and Adolescent Psychiatry

## 2020-04-13 DIAGNOSIS — F411 Generalized anxiety disorder: Secondary | ICD-10-CM

## 2020-04-27 ENCOUNTER — Other Ambulatory Visit: Payer: Self-pay | Admitting: Psychiatry

## 2020-04-27 DIAGNOSIS — F411 Generalized anxiety disorder: Secondary | ICD-10-CM

## 2020-04-27 DIAGNOSIS — F3342 Major depressive disorder, recurrent, in full remission: Secondary | ICD-10-CM

## 2020-05-05 ENCOUNTER — Ambulatory Visit: Payer: 59 | Attending: Internal Medicine

## 2020-05-05 DIAGNOSIS — Z23 Encounter for immunization: Secondary | ICD-10-CM

## 2020-05-05 NOTE — Progress Notes (Signed)
   Covid-19 Vaccination Clinic  Name:  Kimberly Knapp    MRN: [id number] DOB: 1986/03/05  05/05/2020  [name] was observed post Covid-19 immunization for 15 minutes without incident. She was provided with Vaccine Information Sheet and instruction to access the V-Safe system.   [name] was instructed to call 911 with any severe reactions post vaccine: Marland Kitchen Difficulty breathing  . Swelling of face and throat  . A fast heartbeat  . A bad rash all over body  . Dizziness and weakness   Immunizations Administered    Name Date Dose VIS Date Route   Pfizer COVID-19 Vaccine 05/05/2020 12:02 PM 0.3 mL 11/06/2018 Intramuscular   Manufacturer: [employer], [employer]   Lot: [id number]   NDC: [address]-4

## 2020-05-26 ENCOUNTER — Ambulatory Visit: Payer: 59 | Attending: Internal Medicine

## 2020-05-26 DIAGNOSIS — Z23 Encounter for immunization: Secondary | ICD-10-CM

## 2020-05-26 NOTE — Progress Notes (Signed)
   Covid-19 Vaccination Clinic  Name:  Kimberly Knapp    MRN: [id number] DOB: July 24, 1986  05/26/2020  [name] was observed post Covid-19 immunization for 15 minutes without incident. She was provided with Vaccine Information Sheet and instruction to access the V-Safe system.   [name] was instructed to call 911 with any severe reactions post vaccine: Marland Kitchen Difficulty breathing  . Swelling of face and throat  . A fast heartbeat  . A bad rash all over body  . Dizziness and weakness   Immunizations Administered    Name Date Dose VIS Date Route   Pfizer COVID-19 Vaccine 05/26/2020  9:34 AM 0.3 mL 11/06/2018 Intramuscular   Manufacturer: [employer], [employer]   Lot: [id number]   NDC: [id number]

## 2020-06-17 ENCOUNTER — Telehealth (INDEPENDENT_AMBULATORY_CARE_PROVIDER_SITE_OTHER): Payer: 59 | Admitting: Psychiatry

## 2020-06-17 ENCOUNTER — Other Ambulatory Visit: Payer: Self-pay

## 2020-06-17 ENCOUNTER — Encounter: Payer: Self-pay | Admitting: Psychiatry

## 2020-06-17 DIAGNOSIS — F3342 Major depressive disorder, recurrent, in full remission: Secondary | ICD-10-CM | POA: Diagnosis not present

## 2020-06-17 DIAGNOSIS — F411 Generalized anxiety disorder: Secondary | ICD-10-CM | POA: Diagnosis not present

## 2020-06-17 DIAGNOSIS — F900 Attention-deficit hyperactivity disorder, predominantly inattentive type: Secondary | ICD-10-CM

## 2020-06-17 MED ORDER — AMPHETAMINE-DEXTROAMPHETAMINE 20 MG PO TABS: 30.0000 mg | ORAL_TABLET | Freq: Every day | ORAL | 0 refills | Status: DC

## 2020-06-17 MED ORDER — ESCITALOPRAM OXALATE 20 MG PO TABS: 20.0000 mg | ORAL_TABLET | Freq: Every day | ORAL | 0 refills | Status: DC

## 2020-06-17 NOTE — Progress Notes (Signed)
Provider Location : [facility] Patient Location : [place]  Participants: Patient , Provider  Virtual Visit via Video Note  I connected with Kimberly Knapp on 06/17/20 at 10:00 AM EDT by a video enabled telemedicine application and verified that I am speaking with the correct person using two identifiers.   I discussed the limitations of evaluation and management by telemedicine and the availability of in person appointments. The patient expressed understanding and agreed to proceed.    I discussed the assessment and treatment plan with the patient. The patient was provided an opportunity to ask questions and all were answered. The patient agreed with the plan and demonstrated an understanding of the instructions.   The patient was advised to call back or seek an in-person evaluation if the symptoms worsen or if the condition fails to improve as anticipated.  Video connection was lost at less than 50% of the duration of the visit, at which time the remainder of the visit was completed through audio only    [facility] MD OP Progress Note  06/17/2020 10:14 AM SVEA PUSCH  MRN:  [id number]  Chief Complaint:  Chief Complaint    Follow-up     HPI: Kimberly Knapp is a 34 year old female, married, lives in [place], currently unemployed, has a history of MDD, GAD, ADHD was evaluated by telemedicine today.  Patient today reports she is currently stable on current medication regimen.  Denies any significant sadness, crying spells or anxiety symptoms.  She reports sleep is good.  She reports she continues to do well on the Adderall and she is able to focus and concentrate throughout the day.  She denies any suicidality, homicidality or perceptual disturbances.  Patient is compliant on medications, denies side effects.  Patient denies any other concerns today.  Visit Diagnosis:    ICD-10-CM   1. MDD (major depressive disorder), recurrent, in full remission (HCC)  F33.42 escitalopram  (LEXAPRO) 20 MG tablet  2. GAD (generalized anxiety disorder)  F41.1 escitalopram (LEXAPRO) 20 MG tablet  3. Attention deficit hyperactivity disorder (ADHD), predominantly inattentive type  F90.0 amphetamine-dextroamphetamine (ADDERALL) 20 MG tablet    amphetamine-dextroamphetamine (ADDERALL) 20 MG tablet    Past Psychiatric History: I have reviewed past psychiatric history from my progress note on 10/13/2017.  Past trials of Zoloft, Prozac, Celexa, Wellbutrin, Adderall  Past Medical History:  Past Medical History:  Diagnosis Date  . ADHD (attention deficit hyperactivity disorder)   . Depression   . Goiter   . Herpes genitalis   . Obesity (BMI 30.0-34.9)   . Pregnancy induced hypertension     Past Surgical History:  Procedure Laterality Date  . BREAST ENHANCEMENT SURGERY Bilateral   . CESAREAN SECTION N/A 05/29/2015   Procedure: CESAREAN SECTION;  Surgeon: [name], MD;  Location: [facility] ORS;  Service: Obstetrics;  Laterality: N/A;  . CESAREAN SECTION N/A 06/25/2018   Procedure: CESAREAN SECTION, repeat;  Surgeon: [name], Elenora Fender, MD;  Location: [facility] ORS;  Service: Obstetrics;  Laterality: N/A;  . DILATION AND CURETTAGE OF UTERUS    . THYROID SURGERY     thyroid biopsy  . WISDOM TOOTH EXTRACTION      Family Psychiatric History: I have reviewed family psychiatric history from my progress note on 10/13/2017  Family History:  Family History  Problem Relation Age of Onset  . Diabetes type II Other   . Lung cancer Other   . Heart disease Other   . Lung cancer Other   . ADD / ADHD  Father   . Alcohol abuse Maternal Grandfather     Social History: I have reviewed social history from my progress note on 10/13/2017 Social History   Socioeconomic History  . Marital status: Married    Spouse name: michael   . Number of children: 3  . Years of education: Not on file  . Highest education level: Associate degree: occupational, Scientist, product/process development, or vocational program  Occupational History   . 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 Smoking status: Former Smoker    Types: Cigarettes    Quit date: 10/13/2017    Years since quitting: 2.6  . Smokeless tobacco: Never Used  Vaping Use  . Vaping Use: Never used  Substance and Sexual Activity  . Alcohol use: Yes    Alcohol/week: 1.0 standard drink    Types: 1 Glasses of wine per week    Comment: occasional   . Drug use: No  . Sexual activity: Yes    Partners: Male    Birth control/protection: None, I.U.D.  Other Topics Concern  . Not on file  Social History Narrative  . Not on file   Social Determinants of Health   Financial Resource Strain:   . Difficulty of Paying Living Expenses: Not on file  Food Insecurity:   . Worried About Programme researcher, broadcasting/film/video in the Last Year: Not on file  . Ran Out of Food in the Last Year: Not on file  Transportation Needs:   . Lack of Transportation (Medical): Not on file  . Lack of Transportation (Non-Medical): Not on file  Physical Activity:   . Days of Exercise per Week: Not on file  . Minutes of Exercise per Session: Not on file  Stress:   . Feeling of Stress : Not on file  Social Connections:   . Frequency of Communication with Friends and Family: Not on file  . Frequency of Social Gatherings with Friends and Family: Not on file  . Attends Religious Services: Not on file  . Active Member of Clubs or Organizations: Not on file  . Attends Banker Meetings: Not on file  . Marital Status: Not on file    Allergies:  Allergies  Allergen Reactions  . Iodine Swelling  . Shrimp [Shellfish Allergy] Swelling    Metabolic Disorder Labs: No results found for: HGBA1C, MPG No results found for: PROLACTIN No results found for: CHOL, TRIG, HDL, CHOLHDL, VLDL, LDLCALC Lab Results  Component Value Date   TSH 0.423 (L) 09/26/2017    Therapeutic Level Labs: No results found for: LITHIUM No results found for: VALPROATE No components found for:   CBMZ  Current Medications: Current Outpatient Medications  Medication Sig Dispense Refill  . amphetamine-dextroamphetamine (ADDERALL) 20 MG tablet TAKE 1.5 TABLETS (30 MG TOTAL) BY MOUTH DAILY. TAKE ONE TABLET AM AND HALF TABLET PM.    . amphetamine-dextroamphetamine (ADDERALL) 20 MG tablet Take 1.5 tablets (30 mg total) by mouth daily. Take one tablet AM and half tablet PM. 45 tablet 0  . [START ON 07/06/2020] amphetamine-dextroamphetamine (ADDERALL) 20 MG tablet Take 1.5 tablets (30 mg total) by mouth daily. Take one tablet AM and half tablet PM 45 tablet 0  . [START ON 08/04/2020] amphetamine-dextroamphetamine (ADDERALL) 20 MG tablet Take 1.5 tablets (30 mg total) by mouth daily. Take 1 tablet AM and half tablet PM 45 tablet 0  . brompheniramine-pseudoephedrine-DM 30-2-10 MG/5ML syrup Take 5 mLs by mouth 3 (three) times daily as needed.    Marland Kitchen  escitalopram (LEXAPRO) 20 MG tablet Take 1 tablet (20 mg total) by mouth daily. 90 tablet 0  . fluconazole (DIFLUCAN) 150 MG tablet Take 150 mg by mouth once.    . hydrOXYzine (ATARAX/VISTARIL) 25 MG tablet TAKE 1 TABLET (25 MG TOTAL) BY MOUTH DAILY AS NEEDED. FOR SEVERE ANXIETY ATTACKS ONLY 90 tablet 1  . ipratropium (ATROVENT) 0.06 % nasal spray Place 2 sprays into both nostrils 2 (two) times daily.    Marland Kitchen moxifloxacin (AVELOX) 400 MG tablet Take 400 mg by mouth daily.    . predniSONE (DELTASONE) 20 MG tablet Take 20 mg by mouth 2 (two) times daily.    . traZODone (DESYREL) 50 MG tablet TAKE 0.5-1 TABLETS (25-50 MG TOTAL) BY MOUTH AT BEDTIME AS NEEDED FOR SLEEP. 90 tablet 1   No current facility-administered medications for this visit.     Musculoskeletal: Strength & Muscle Tone: UTA Gait & Station: normal Patient leans: N/A  Psychiatric Specialty Exam: Review of Systems  Psychiatric/Behavioral: Negative for agitation, behavioral problems, confusion, decreased concentration, dysphoric mood, hallucinations, self-injury, sleep disturbance and  suicidal ideas. The patient is not nervous/anxious and is not hyperactive.   All other systems reviewed and are negative.   not currently breastfeeding.There is no height or weight on file to calculate BMI.  General Appearance: Casual  Eye Contact:  Fair  Speech:  Clear and Coherent  Volume:  Normal  Mood:  Euthymic  Affect:  Congruent  Thought Process:  Goal Directed and Descriptions of Associations: Intact  Orientation:  Full (Time, Place, and Person)  Thought Content: Logical   Suicidal Thoughts:  No  Homicidal Thoughts:  No  Memory:  Immediate;   Fair Recent;   Fair Remote;   Fair  Judgement:  Fair  Insight:  Fair  Psychomotor Activity:  Normal  Concentration:  Concentration: Fair and Attention Span: Fair  Recall:  [employer] of Knowledge: Fair  Language: Fair  Akathisia:  No  Handed:  Right  AIMS (if indicated): UTA  Assets:  Communication Skills Desire for Improvement Housing Social Support  ADL's:  Intact  Cognition: WNL  Sleep:  Fair   Screenings:   Assessment and Plan: Kimberly Knapp is a 34 year old Caucasian female who has a history of MDD, GAD, ADD was evaluated by telemedicine today.  Patient is currently stable on current medication regimen.  Plan as noted below.  Plan MDD in remission Lexapro as prescribed Trazodone 25 to 50 mg p.o. nightly as needed.  She rarely uses it.  GAD-stable Lexapro 20 mg p.o. daily  ADHD-stable Adderall 30 mg p.o. daily divided dosage I have reviewed [place] controlled substance database. I have provided 2 prescriptions with date specified last one to be filled on or after 08/04/2020.   Follow-up in clinic in 2 to 3 months or sooner if needed.  I have spent atleast 20 minutes non ace to face with patient today. More than 50 % of the time was spent for preparing to see the patient ( e.g., review of test, records ),ordering medications and test ,psychoeducation and supportive psychotherapy and care coordination,as well as  documenting clinical information in electronic health record. This note was generated in part or whole with voice recognition software. Voice recognition is usually quite accurate but there are transcription errors that can and very often do occur. I apologize for any typographical errors that were not detected and corrected.       Jomarie Longs, MD 06/17/2020, 10:14 AM

## 2020-06-26 IMAGING — CT CT ABD-PELV W/ CM
2 of 5 series · 16 of 46 positions shown, 18 images · IV contrast (APPLIED)
Comparison: None.

CLINICAL DATA: Severe back pain since C-section 2 days ago.

EXAM:
CT ABDOMEN AND PELVIS WITH CONTRAST
TECHNIQUE: Multidetector CT imaging of the abdomen and pelvis was performed
using the standard protocol following bolus administration of
intravenous contrast.
CONTRAST:  100mL [id number] IOPAMIDOL (PFBSW1-344) INJECTION 61%

[Series 2: axial st · axial · 0.89mm/px · z∈[-532,-62]mm · 13 of 110 slices shown, 15 images]
[im 8/110  soft-tissue]
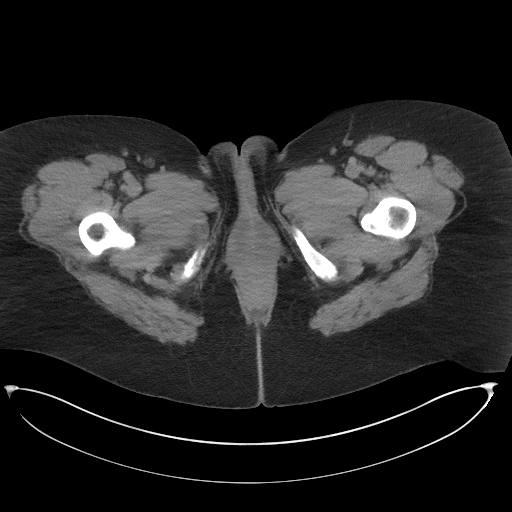
[im 8/110  bone]
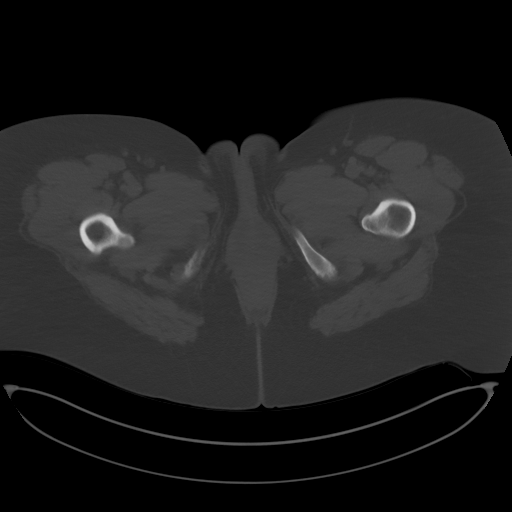
[im 16/110  soft-tissue]
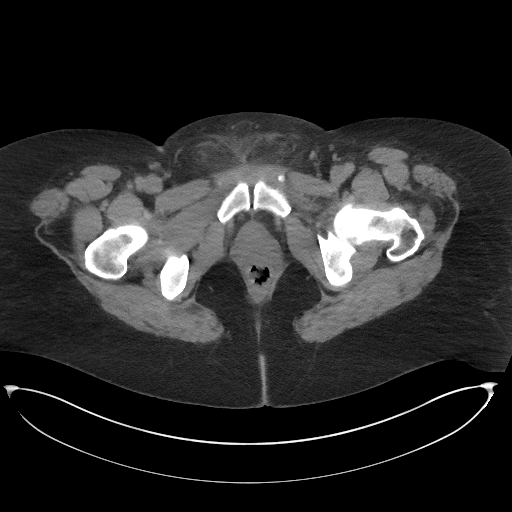
[im 24/110  soft-tissue]
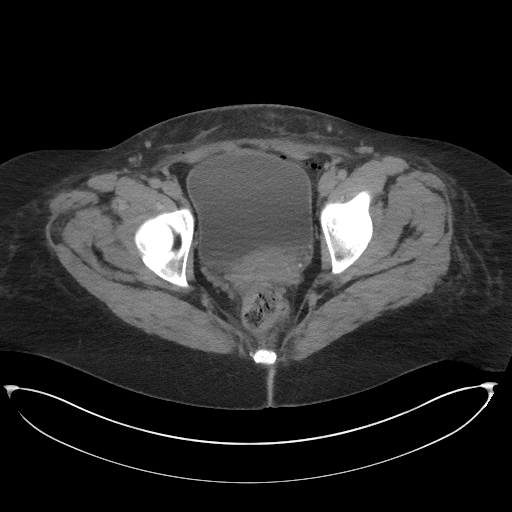
[im 32/110  soft-tissue]
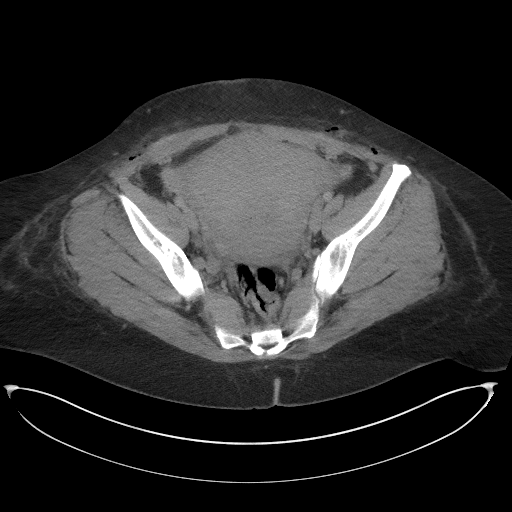
[im 39/110  soft-tissue]
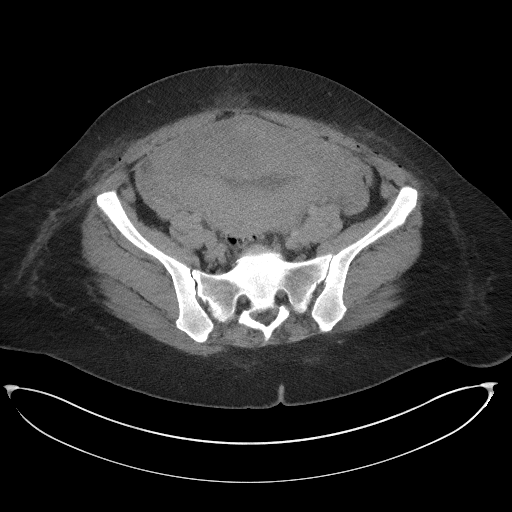
[im 47/110  soft-tissue]
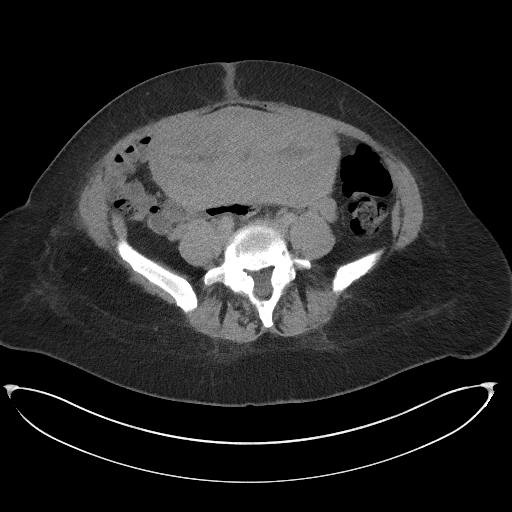
[im 55/110  soft-tissue]
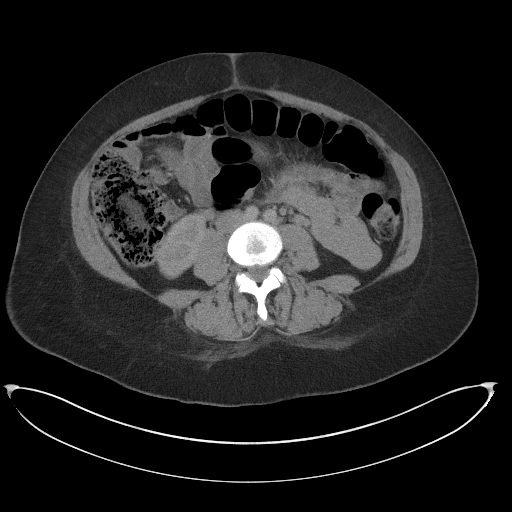
[im 63/110  soft-tissue]
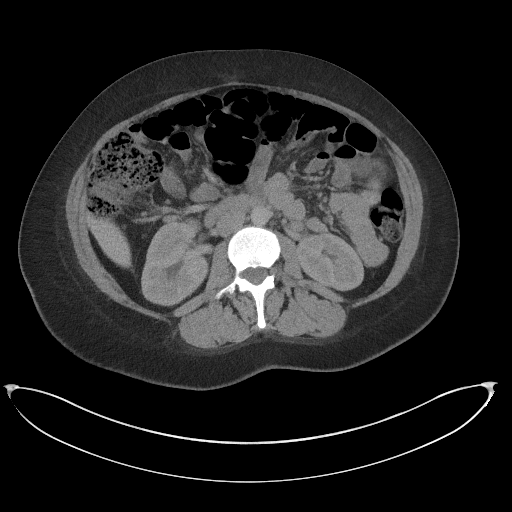
[im 71/110  soft-tissue]
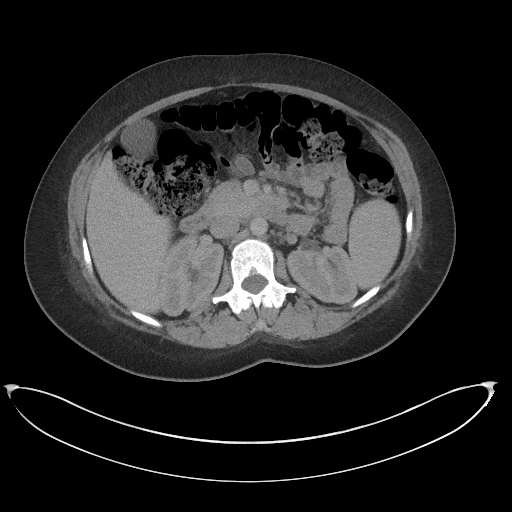
[im 71/110  bone]
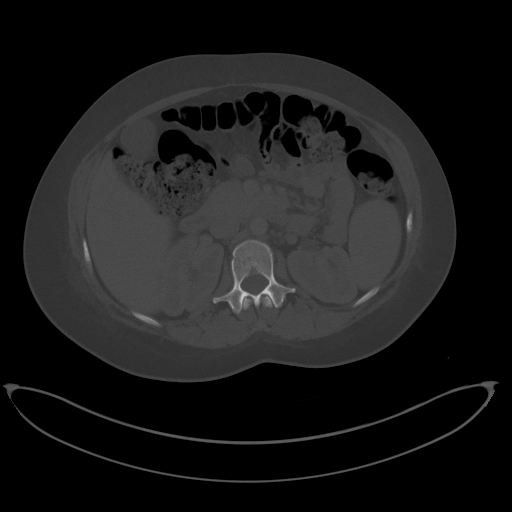
[im 78/110  soft-tissue]
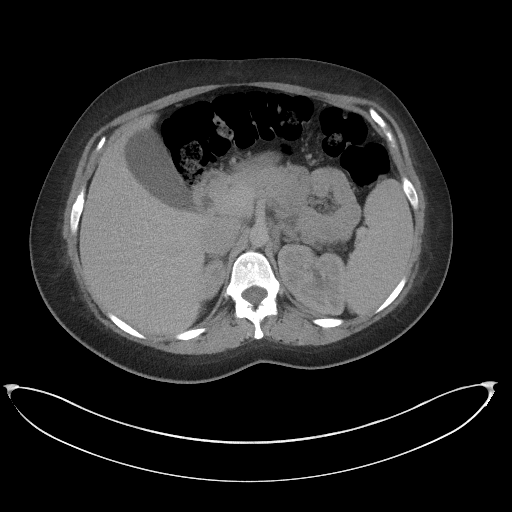
[im 86/110  soft-tissue]
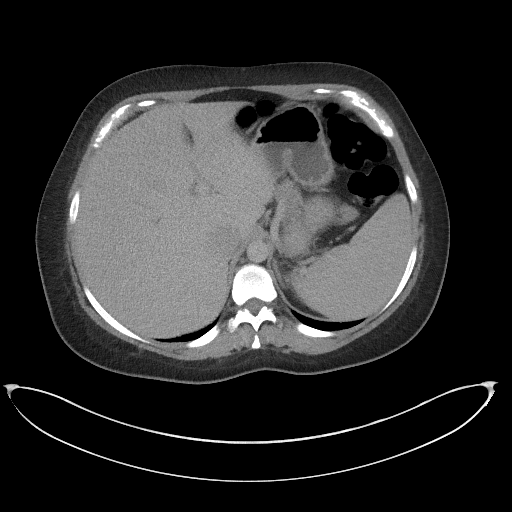
[im 94/110  soft-tissue]
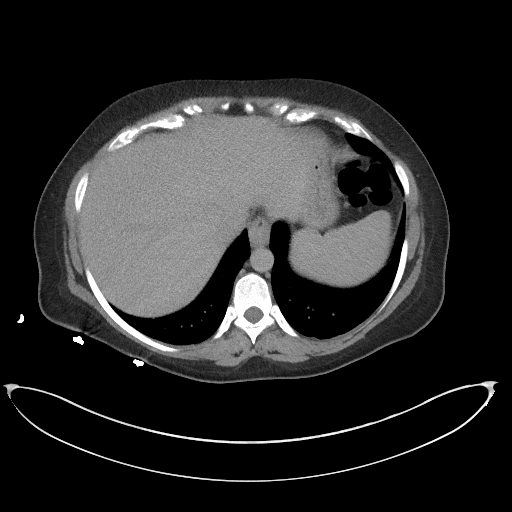
[im 102/110  soft-tissue]
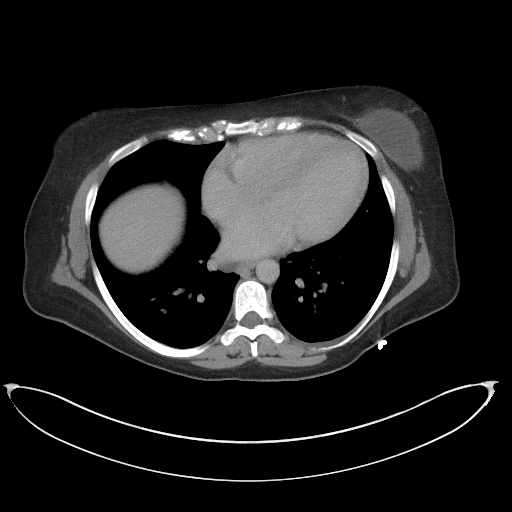

[Series 5: coronal st · coronal · 0.98mm/px · 3 of 101 slices shown]
[im 34/101  soft-tissue]
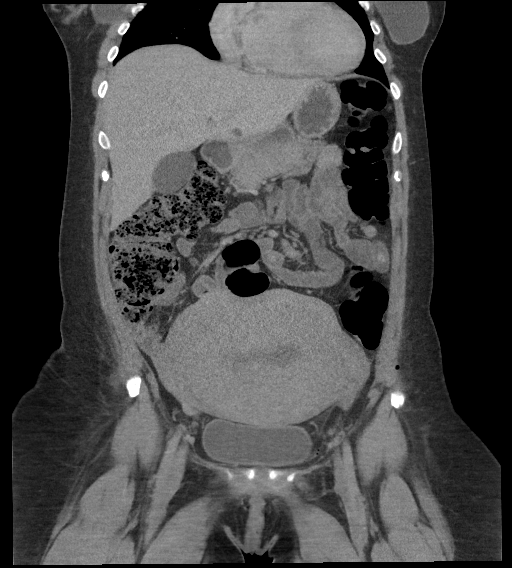
[im 45/101  soft-tissue]
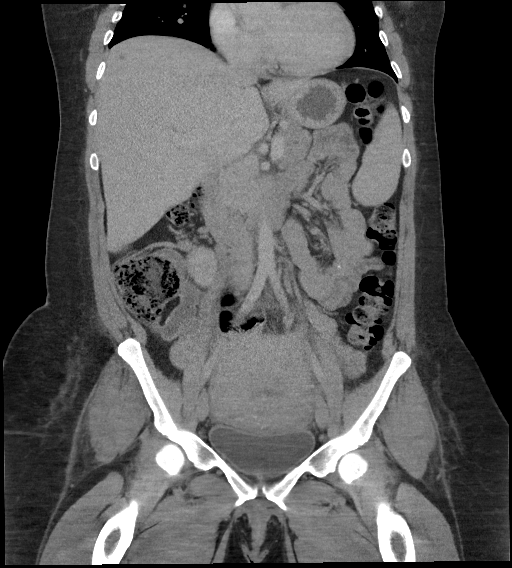
[im 56/101  soft-tissue]
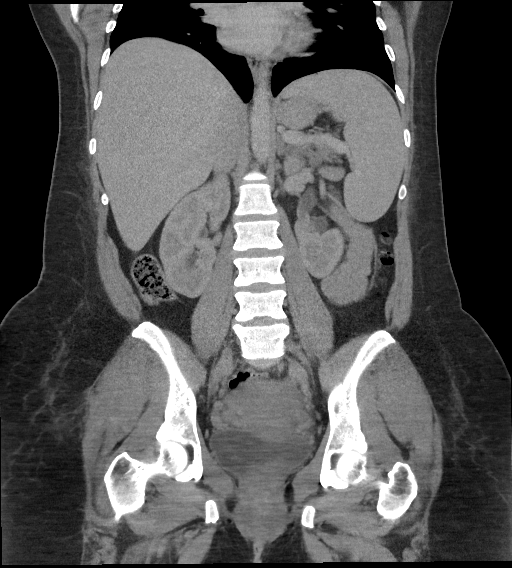

[16 of 46 positions shown; findings below may reference images not displayed]

FINDINGS: Lower chest: Bilateral breast implants.  Otherwise negative.

Hepatobiliary: No significant abnormality. 9 mm cyst in left lobe of
the liver. Normal gallbladder. No dilated bile ducts.

Pancreas: Unremarkable. No pancreatic ductal dilatation or
surrounding inflammatory changes.

Spleen: Normal in size without focal abnormality.

Adrenals/Urinary Tract: 10 mm cyst in the mid left kidney. Kidneys
are otherwise normal. Adrenal glands are normal. Bladder is normal.

Stomach/Bowel: Stomach is within normal limits. Appendix appears
normal. No evidence of bowel wall thickening, distention, or
inflammatory changes.

Vascular/Lymphatic: No significant vascular findings are present. No
enlarged abdominal or pelvic lymph nodes.

Reproductive: Normal post partum appearance of the uterus with
expected surgical changes of recent C-section. Vague lucency in the
anterior aspect of the uterus is felt to be normal in this
postpartum period.

Other: Slight air and soft tissue stranding at the site of C-section
incision to the expected degree.

Musculoskeletal: Small soft disc protrusion at L4-5 to the right
seen on image 62 of series 2. Otherwise negative.
IMPRESSION: 1. Postsurgical changes consistent with recent C-section.
2. Small focal disc protrusion at L4-5 to the right of midline which
could affect the right L5 nerve.

## 2020-07-07 ENCOUNTER — Telehealth: Payer: Self-pay

## 2020-07-07 DIAGNOSIS — F900 Attention-deficit hyperactivity disorder, predominantly inattentive type: Secondary | ICD-10-CM

## 2020-07-07 MED ORDER — AMPHETAMINE-DEXTROAMPHETAMINE 20 MG PO TABS: 30.0000 mg | ORAL_TABLET | Freq: Every day | ORAL | 0 refills | Status: DC

## 2020-07-07 NOTE — Telephone Encounter (Signed)
[facility] called [facility] not have and does not know when they will get in.  [facility] [address]

## 2020-07-07 NOTE — Telephone Encounter (Signed)
Please let patient know [facility] on [address] for this month.

## 2020-07-07 NOTE — Telephone Encounter (Signed)
I have [facility] [address] per patient request.  [facility] [address] [facility].

## 2020-07-20 ENCOUNTER — Telehealth: Payer: Self-pay | Admitting: General Practice

## 2020-07-20 NOTE — Telephone Encounter (Signed)
Patient is calling to schedule an in person appt for dizziness, cough, congestion Patient denies wanting a virtual appt. And is wanting an in office appt. Please advise  Cb- [phone]

## 2020-07-21 NOTE — Telephone Encounter (Signed)
Has not been seen in clinic since 2019, but [place].  Due to current symptoms and Covid pandemic she would benefit from virtual visit and then we could schedule for outpatient Covid testing here.  If she wishes face to face visit then recommend urgent care setting.

## 2020-07-21 NOTE — Telephone Encounter (Signed)
Called pt and advise her that with her symptoms we would have to do a virtual visit first. Pt states that she would rather be seen in person, so she will go to an urgent care

## 2020-08-20 ENCOUNTER — Other Ambulatory Visit: Payer: Self-pay

## 2020-08-20 ENCOUNTER — Encounter: Payer: Self-pay | Admitting: Unknown Physician Specialty

## 2020-08-20 ENCOUNTER — Ambulatory Visit (INDEPENDENT_AMBULATORY_CARE_PROVIDER_SITE_OTHER): Payer: 59 | Admitting: Unknown Physician Specialty

## 2020-08-20 VITALS — Temp 98.3°F | Ht 67.3 in | Wt 230.8 lb

## 2020-08-20 DIAGNOSIS — I491 Atrial premature depolarization: Secondary | ICD-10-CM

## 2020-08-20 DIAGNOSIS — R42 Dizziness and giddiness: Secondary | ICD-10-CM | POA: Diagnosis not present

## 2020-08-20 DIAGNOSIS — R002 Palpitations: Secondary | ICD-10-CM

## 2020-08-20 MED ORDER — FLUTICASONE PROPIONATE 50 MCG/ACT NA SUSP
2.0000 | Freq: Every day | NASAL | 6 refills | Status: DC
Start: 2020-08-20 — End: 2021-06-09

## 2020-08-20 NOTE — Progress Notes (Signed)
Temp 98.3 F (36.8 C) (Oral)   Ht 5' 7.3" (1.709 m)   Wt 230 lb 12.8 oz (104.7 kg)   LMP  (LMP Unknown)   SpO2 98%   BMI 35.83 kg/m    Subjective:    Patient ID: Kimberly Knapp, female    DOB: [birth year], 34 y.o.   MRN: [id number]  HPI: Kimberly Knapp is a 34 y.o. female  Chief Complaint  Patient presents with  . Dizziness    Pt states she has been having dizziness and lightheadedness since Monday morning. States she mainly feels this way in the morning and gets better in the afternoons. States she went to ENT on Tuesday and was told no inner ear issues   Dizziness This is a new problem. Episode onset: 4 days ago. The problem occurs intermittently. The problem has been waxing and waning. Associated symptoms include nausea and vertigo. Pertinent negatives include no abdominal pain, anorexia, arthralgias, change in bowel habit, chest pain, chills, congestion, coughing, diaphoresis, fatigue, fever, headaches, joint swelling, myalgias, neck pain, numbness, rash, sore throat, swollen glands, urinary symptoms, visual change, vomiting or weakness.   States she went to ENT and was told no inner ear.  Also feeling palpitations.  Went to the hospital for this.  Was told they were PVCs.    Relevant past medical, surgical, family and social history reviewed and updated as indicated. Interim medical history since our last visit reviewed. Allergies and medications reviewed and updated.  Review of Systems  Constitutional: Negative for chills, diaphoresis, fatigue and fever.  HENT: Negative for congestion and sore throat.   Respiratory: Negative for cough.   Cardiovascular: Negative for chest pain.  Gastrointestinal: Positive for nausea. Negative for abdominal pain, anorexia, change in bowel habit and vomiting.  Musculoskeletal: Negative for arthralgias, joint swelling, myalgias and neck pain.  Skin: Negative for rash.  Neurological: Positive for dizziness and vertigo. Negative for  weakness, numbness and headaches.    Per HPI unless specifically indicated above     Objective:    Temp 98.3 F (36.8 C) (Oral)   Ht 5' 7.3" (1.709 m)   Wt 230 lb 12.8 oz (104.7 kg)   LMP  (LMP Unknown)   SpO2 98%   BMI 35.83 kg/m   Wt Readings from Last 3 Encounters:  08/20/20 230 lb 12.8 oz (104.7 kg)  02/06/20 240 lb (108.9 kg)  06/25/18 259 lb 11.2 oz (117.8 kg)    Physical Exam Constitutional:      General: She is not in acute distress.    Appearance: Normal appearance. She is well-developed and well-nourished.  HENT:     Head: Normocephalic and atraumatic.  Eyes:     General: Lids are normal. No scleral icterus.       Right eye: No discharge.        Left eye: No discharge.     Conjunctiva/sclera: Conjunctivae normal.  Neck:     Vascular: No carotid bruit or JVD.  Cardiovascular:     Rate and Rhythm: Normal rate. Rhythm irregular.     Heart sounds: Normal heart sounds.  Pulmonary:     Effort: Pulmonary effort is normal.     Breath sounds: Normal breath sounds.  Abdominal:     Palpations: There is no hepatomegaly or splenomegaly.  Musculoskeletal:        General: Normal range of motion.     Cervical back: Normal range of motion and neck supple.  Skin:    General: Skin  is warm, dry and intact.     Coloration: Skin is not pale.     Findings: No rash.  Neurological:     Mental Status: She is alert and oriented to person, place, and time.  Psychiatric:        Mood and Affect: Mood and affect normal.        Behavior: Behavior normal.        Thought Content: Thought content normal.        Judgment: Judgment normal.   EKG with NSR without STTW changes.  2 PACs on todays EKG    Assessment & Plan:   Problem List Items Addressed This Visit   None   Visit Diagnoses    Palpitations    -  Primary   she has been to the ER once with this and now presents again with dizzyness.  EKG showing PACs.  Will refer to cardiology for further work up   Relevant Orders    EKG 12-Lead (Completed)   Ambulatory referral to Cardiology   CBC with Differential/Platelet   Comprehensive metabolic panel   TSH   Dizziness       This is consistent with BPPV though not showing at ENT but symptoms mostly in the AM.  Will rx flonase to see if it helps   Relevant Orders   Ambulatory referral to Cardiology   CBC with Differential/Platelet   Comprehensive metabolic panel   TSH   Hypocalcemia       Noted on routine labs.  Recheck CMP along with a PTH   Relevant Orders   PTH, intact (no Ca)   PAC (premature atrial contraction)       These are bothersome and distressing.  Will refer to cardiology       Follow up plan: Return for with cardiology and lab results.

## 2020-08-21 LAB — CBC WITH DIFFERENTIAL/PLATELET
Basophils Absolute: 0 10*3/uL (ref 0.0–0.2)
Basos: 1 %
EOS (ABSOLUTE): 0.2 10*3/uL (ref 0.0–0.4)
Eos: 2 %
Hematocrit: 43.5 % (ref 34.0–46.6)
Hemoglobin: 14.9 g/dL (ref 11.1–15.9)
Immature Grans (Abs): 0 10*3/uL (ref 0.0–0.1)
Immature Granulocytes: 0 %
Lymphocytes Absolute: 3 10*3/uL (ref 0.7–3.1)
Lymphs: 42 %
MCH: 32 pg (ref 26.6–33.0)
MCHC: 34.3 g/dL (ref 31.5–35.7)
MCV: 94 fL (ref 79–97)
Monocytes Absolute: 0.4 10*3/uL (ref 0.1–0.9)
Monocytes: 6 %
Neutrophils Absolute: 3.6 10*3/uL (ref 1.4–7.0)
Neutrophils: 49 %
Platelets: 344 10*3/uL (ref 150–450)
RBC: 4.65 x10E6/uL (ref 3.77–5.28)
RDW: 11.9 % (ref 11.7–15.4)
WBC: 7.2 10*3/uL (ref 3.4–10.8)

## 2020-08-21 LAB — COMPREHENSIVE METABOLIC PANEL
ALT: 14 IU/L (ref 0–32)
AST: 17 IU/L (ref 0–40)
Albumin/Globulin Ratio: 2.3 — ABNORMAL HIGH (ref 1.2–2.2)
Albumin: 4.8 g/dL (ref 3.8–4.8)
Alkaline Phosphatase: 73 IU/L (ref 44–121)
BUN/Creatinine Ratio: 13 (ref 9–23)
BUN: 11 mg/dL (ref 6–20)
Bilirubin Total: 0.3 mg/dL (ref 0.0–1.2)
CO2: 19 mmol/L — ABNORMAL LOW (ref 20–29)
Calcium: 9.5 mg/dL (ref 8.7–10.2)
Chloride: 105 mmol/L (ref 96–106)
Creatinine, Ser: 0.82 mg/dL (ref 0.57–1.00)
GFR calc Af Amer: 106 mL/min/{1.73_m2} (ref >59)
GFR calc non Af Amer: 92 mL/min/{1.73_m2} (ref >59)
Globulin, Total: 2.1 g/dL (ref 1.5–4.5)
Glucose: 92 mg/dL (ref 65–99)
Potassium: 4 mmol/L (ref 3.5–5.2)
Sodium: 139 mmol/L (ref 134–144)
Total Protein: 6.9 g/dL (ref 6.0–8.5)

## 2020-08-21 LAB — TSH: TSH: 0.676 u[IU]/mL (ref 0.450–4.500)

## 2020-08-21 LAB — PARATHYROID HORMONE, INTACT (NO CA): PTH: 31 pg/mL (ref 15–65)

## 2020-08-31 ENCOUNTER — Telehealth (INDEPENDENT_AMBULATORY_CARE_PROVIDER_SITE_OTHER): Payer: 59 | Admitting: Psychiatry

## 2020-08-31 ENCOUNTER — Encounter: Payer: Self-pay | Admitting: Psychiatry

## 2020-08-31 ENCOUNTER — Other Ambulatory Visit: Payer: Self-pay

## 2020-08-31 DIAGNOSIS — F411 Generalized anxiety disorder: Secondary | ICD-10-CM

## 2020-08-31 DIAGNOSIS — F3342 Major depressive disorder, recurrent, in full remission: Secondary | ICD-10-CM

## 2020-08-31 DIAGNOSIS — F900 Attention-deficit hyperactivity disorder, predominantly inattentive type: Secondary | ICD-10-CM

## 2020-08-31 NOTE — Progress Notes (Signed)
Virtual Visit via Video Note  I connected with Kimberly Knapp on 08/31/20 at 10:20 AM EST by a video enabled telemedicine application and verified that I am speaking with the correct person using two identifiers.  Location Provider Location : [facility] Patient Location : Home  Participants: Patient , Provider   I discussed the limitations of evaluation and management by telemedicine and the availability of in person appointments. The patient expressed understanding and agreed to proceed.   I discussed the assessment and treatment plan with the patient. The patient was provided an opportunity to ask questions and all were answered. The patient agreed with the plan and demonstrated an understanding of the instructions.   The patient was advised to call back or seek an in-person evaluation if the symptoms worsen or if the condition fails to improve as anticipated.  BH MD OP Progress Note  08/31/2020 1:17 PM Kimberly Knapp  MRN:  [id number]  Chief Complaint:  Chief Complaint    Follow-up     HPI: Kimberly Knapp is a 34 year old female, married, lives in [place], currently unemployed, has a history of MDD, GAD, ADHD was evaluated by telemedicine today.  Patient today reports she was recently diagnosed with premature atrial contractions and has been referred to cardiology.  She has upcoming appointment with them the first week of January.  She reports she had similar problems in the past when she was on prednisone.  At that time she had a visit to the emergency department and was advised to hold off caffeine, prednisone as well as Adderall for a few days.  Patient overall reports her mood symptoms as okay.  She denies any suicidality, homicidality or perceptual disturbances.  Patient denies any other concerns today.  Visit Diagnosis:    ICD-10-CM   1. MDD (major depressive disorder), recurrent, in full remission (HCC)  F33.42   2. GAD (generalized anxiety disorder)  F41.1   3.  Attention deficit hyperactivity disorder (ADHD), predominantly inattentive type  F90.0     Past Psychiatric History: I have reviewed past psychiatric history from my progress note on 10/13/2017.  Past trials of Zoloft, Prozac, Celexa, Wellbutrin, Adderall  Past Medical History:  Past Medical History:  Diagnosis Date  . ADHD (attention deficit hyperactivity disorder)   . Depression   . Goiter   . Herpes genitalis   . Obesity (BMI 30.0-34.9)   . Pregnancy induced hypertension     Past Surgical History:  Procedure Laterality Date  . BREAST ENHANCEMENT SURGERY Bilateral   . CESAREAN SECTION N/A 05/29/2015   Procedure: CESAREAN SECTION;  Surgeon: [name], MD;  Location: [facility]S;  Service: Obstetrics;  Laterality: N/A;  . CESAREAN SECTION N/A 06/25/2018   Procedure: CESAREAN SECTION, repeat;  Surgeon: [name], Elenora Fender, MD;  Location: [facility] ORS;  Service: Obstetrics;  Laterality: N/A;  . DILATION AND CURETTAGE OF UTERUS    . THYROID SURGERY     thyroid biopsy  . WISDOM TOOTH EXTRACTION      Family Psychiatric History: I have reviewed family psychiatric history from my progress note on 10/13/2017  Family History:  Family History  Problem Relation Age of Onset  . Diabetes type II Other   . Lung cancer Other   . Heart disease Other   . Lung cancer Other   . ADD / ADHD Father   . Alcohol abuse Maternal Grandfather     Social History: Reviewed social history from my progress note on 10/13/2017 Social History  Socioeconomic History  . Marital status: Married    Spouse name: michael   . Number of children: 3  . Years of education: Not on file  . Highest education level: Associate degree: occupational, Scientist, product/process development, or vocational program  Occupational History  . 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 Smoking status: Former Smoker    Types: Cigarettes    Quit date: 10/13/2017    Years since quitting: 2.8  . Smokeless tobacco: Never Used  Vaping Use  .  Vaping Use: Never used  Substance and Sexual Activity  . Alcohol use: Yes    Alcohol/week: 1.0 standard drink    Types: 1 Glasses of wine per week    Comment: occasional   . Drug use: No  . Sexual activity: Yes    Partners: Male    Birth control/protection: None, I.U.D.  Other Topics Concern  . Not on file  Social History Narrative  . Not on file   Social Determinants of Health   Financial Resource Strain: Not on file  Food Insecurity: Not on file  Transportation Needs: Not on file  Physical Activity: Not on file  Stress: Not on file  Social Connections: Not on file    Allergies:  Allergies  Allergen Reactions  . Iodine Swelling  . Shrimp [Shellfish Allergy] Swelling    Metabolic Disorder Labs: No results found for: HGBA1C, MPG No results found for: PROLACTIN No results found for: CHOL, TRIG, HDL, CHOLHDL, VLDL, LDLCALC Lab Results  Component Value Date   TSH 0.676 08/20/2020   TSH 0.423 (L) 09/26/2017    Therapeutic Level Labs: No results found for: LITHIUM No results found for: VALPROATE No components found for:  CBMZ  Current Medications: Current Outpatient Medications  Medication Sig Dispense Refill  . amphetamine-dextroamphetamine (ADDERALL) 20 MG tablet TAKE 1.5 TABLETS (30 MG TOTAL) BY MOUTH DAILY. TAKE ONE TABLET AM AND HALF TABLET PM.    . amphetamine-dextroamphetamine (ADDERALL) 20 MG tablet Take 1.5 tablets (30 mg total) by mouth daily. Take one tablet AM and half tablet PM. 45 tablet 0  . amphetamine-dextroamphetamine (ADDERALL) 20 MG tablet Take 1.5 tablets (30 mg total) by mouth daily. Take 1 tablet AM and half tablet PM 45 tablet 0  . amphetamine-dextroamphetamine (ADDERALL) 20 MG tablet Take 1.5 tablets (30 mg total) by mouth daily. Take one tablet AM and half tablet PM 45 tablet 0  . escitalopram (LEXAPRO) 20 MG tablet Take 1 tablet (20 mg total) by mouth daily. 90 tablet 0  . fluticasone (FLONASE) 50 MCG/ACT nasal spray Place 2 sprays into both  nostrils daily. 16 g 6  . hydrOXYzine (ATARAX/VISTARIL) 25 MG tablet TAKE 1 TABLET (25 MG TOTAL) BY MOUTH DAILY AS NEEDED. FOR SEVERE ANXIETY ATTACKS ONLY (Patient not taking: Reported on 08/20/2020) 90 tablet 1   No current facility-administered medications for this visit.     Musculoskeletal: Strength & Muscle Tone: UTA Gait & Station: normal Patient leans: N/A  Psychiatric Specialty Exam: Review of Systems  Psychiatric/Behavioral: Negative for agitation, behavioral problems, confusion, decreased concentration, dysphoric mood, hallucinations, self-injury, sleep disturbance and suicidal ideas. The patient is not nervous/anxious and is not hyperactive.   All other systems reviewed and are negative.   There were no vitals taken for this visit.There is no height or weight on file to calculate BMI.  General Appearance: Casual  Eye Contact:  Fair  Speech:  Clear and Coherent  Volume:  Normal  Mood:  Euthymic  Affect:  Congruent  Thought Process:  Goal Directed and Descriptions of Associations: Intact  Orientation:  Full (Time, Place, and Person)  Thought Content: Logical   Suicidal Thoughts:  No  Homicidal Thoughts:  No  Memory:  Immediate;   Fair Recent;   Fair Remote;   Fair  Judgement:  Fair  Insight:  Fair  Psychomotor Activity:  Normal  Concentration:  Concentration: Fair and Attention Span: Fair  Recall:  [employer] of Knowledge: Fair  Language: Fair  Akathisia:  No  Handed:  Right  AIMS (if indicated): UTA  Assets:  Communication Skills Desire for Improvement Housing Social Support  ADL's:  Intact  Cognition: WNL  Sleep:  Fair   Screenings: PHQ2-9   Flowsheet Row Office Visit from 08/20/2020 in [facility]  PHQ-2 Total Score 0  PHQ-9 Total Score 6       Assessment and Plan: Kimberly Knapp is a 34 year old Caucasian female who has a history of MDD, GAD, ADD was evaluated by telemedicine today.  Patient is currently struggling with irregular  heart rate, diagnosed with premature atrial contractions recently.  Discussed plan as noted below.  Plan MDD in remission Lexapro as prescribed Trazodone 25 to 50 mg p.o. nightly as needed-as needed rarely uses it.  GAD-stable Lexapro 20 mg p.o. daily  ADHD-stable Discussed with patient to hold off Adderall since she has cardiac problems and has upcoming cardiology appointment. Discussed starting a medication like venlafaxine or Wellbutrin or Strattera for her ADHD symptoms.  Patient however wants to wait and will get back to writer once she has her cardiology visit.  Patient advised to sign a release to obtain medical records-from cardiology.  Follow-up in clinic in 1 month or sooner if needed.  I have spent atleast 20 minutes- face to face by video with patient today. More than 50 % of the time was spent for preparing to see the patient ( e.g., review of test, records ), obtaining and to review and separately obtained history , ordering medications and test ,psychoeducation and supportive psychotherapy and care coordination,as well as documenting clinical information in electronic health record. This note was generated in part or whole with voice recognition software. Voice recognition is usually quite accurate but there are transcription errors that can and very often do occur. I apologize for any typographical errors that were not detected and corrected.        Jomarie Longs, MD 08/31/2020, 1:17 PM

## 2020-09-02 ENCOUNTER — Telehealth: Payer: Self-pay

## 2020-09-02 DIAGNOSIS — R002 Palpitations: Secondary | ICD-10-CM | POA: Insufficient documentation

## 2020-09-02 DIAGNOSIS — I491 Atrial premature depolarization: Secondary | ICD-10-CM | POA: Insufficient documentation

## 2020-09-02 DIAGNOSIS — I493 Ventricular premature depolarization: Secondary | ICD-10-CM | POA: Insufficient documentation

## 2020-09-02 NOTE — Telephone Encounter (Signed)
Medication management - Pt. left a message she did get into see Dr. Darrold Junker, Cardiologist today due to heart palpitations and concerns for her being on Adderall medications.  Pt. wanted Dr. Elna Breslow to know he was fine with her staying on this for now. Pt. Reported he put her on a heart monitor for the next 3 days and discussed many possible reasons she may be having PACs.  Patient wanted to inform Dr. Elna Breslow and to discuss continued use of medications.

## 2020-09-02 NOTE — Telephone Encounter (Signed)
Returned call to patient.  Patient reports that her cardiologist was okay with her continuing the Adderall.  I communicated with cardiologist Dr.Paraschos -who communicated back stating that he wants to do a Holter off of Adderall first and then on Adderall.  When writer communicated this with patient, patient reports  that is not what she was told by the cardiologist.  She however reports she will get in touch with cardiology.  Discussed with patient that we will need some kind of documentation from cardiology that they are okay with her resuming the Adderall.  I can put her back on her Adderall if she can get cardiology clearance.  Since I may not be in office for a few days next week, will also route this message to Dr. Jerold Coombe who will be covering for me. If I am not in office, the covering provider will be able to provide few days supply based on cardiology notes.

## 2020-09-08 ENCOUNTER — Telehealth: Payer: Self-pay | Admitting: *Deleted

## 2020-09-08 DIAGNOSIS — F900 Attention-deficit hyperactivity disorder, predominantly inattentive type: Secondary | ICD-10-CM

## 2020-09-08 MED ORDER — AMPHETAMINE-DEXTROAMPHETAMINE 20 MG PO TABS: 30.0000 mg | ORAL_TABLET | Freq: Every day | ORAL | 0 refills | Status: DC

## 2020-09-08 NOTE — Telephone Encounter (Signed)
Placed call to patients cardiologists office to obtain approval for patient to restart Adderall.  Because the Fax machine is not working at our location we did not receive the 3 faxes the MD sent to confirm this. I received verbal confirmation that it was appropriate to restart this so that the patient can continue cardiac study that involves wearing a Holter monitor while she is on the medication.  Dr Cassie Freer office number is:[phone] if you want to confirm. Please review.

## 2020-09-08 NOTE — Telephone Encounter (Signed)
As discussed, please let me know once we have fax from Cardiology that it is safe for her to start Adderall. Thanks

## 2020-09-08 NOTE — Telephone Encounter (Signed)
Will do!

## 2020-09-08 NOTE — Telephone Encounter (Signed)
Thanks I have called her.

## 2020-09-08 NOTE — Telephone Encounter (Signed)
Patient wants to restart her Adderall.  She was having palpitations and saw a cardiologist.  If it is restarted she will need refills.  Please review cardiologist report and advise.

## 2020-09-08 NOTE — Telephone Encounter (Signed)
Ok thanks for letting me know. I sent rx for Adderall for 4 days to cover her until Dr. Elna Breslow returns.

## 2020-09-10 MED ORDER — AMPHETAMINE-DEXTROAMPHETAMINE 20 MG PO TABS: 30.0000 mg | ORAL_TABLET | Freq: Every day | ORAL | 0 refills | Status: DC

## 2020-09-10 NOTE — Telephone Encounter (Signed)
I have sent Adderall-30-day supply to [facility] [address]

## 2020-09-10 NOTE — Telephone Encounter (Signed)
thanks

## 2020-09-10 NOTE — Addendum Note (Signed)
Addended byJomarie Longs on: 09/10/2020 08:08 AM   Modules accepted: Orders

## 2020-09-29 ENCOUNTER — Other Ambulatory Visit: Payer: Self-pay

## 2020-09-29 ENCOUNTER — Telehealth (INDEPENDENT_AMBULATORY_CARE_PROVIDER_SITE_OTHER): Payer: 59 | Admitting: Psychiatry

## 2020-09-29 ENCOUNTER — Encounter: Payer: Self-pay | Admitting: Psychiatry

## 2020-09-29 DIAGNOSIS — F900 Attention-deficit hyperactivity disorder, predominantly inattentive type: Secondary | ICD-10-CM

## 2020-09-29 DIAGNOSIS — F3342 Major depressive disorder, recurrent, in full remission: Secondary | ICD-10-CM

## 2020-09-29 DIAGNOSIS — F411 Generalized anxiety disorder: Secondary | ICD-10-CM

## 2020-09-29 MED ORDER — AMPHETAMINE-DEXTROAMPHETAMINE 20 MG PO TABS: 30.0000 mg | ORAL_TABLET | Freq: Every day | ORAL | 0 refills | Status: DC

## 2020-09-29 NOTE — Progress Notes (Signed)
Virtual Visit via Video Note  I connected with Kimberly Knapp on 09/29/20 at  9:00 AM EST by a video enabled telemedicine application and verified that I am speaking with the correct person using two identifiers.  Location Provider Location : [facility] Patient Location : Home  Participants: Patient , Provider   I discussed the limitations of evaluation and management by telemedicine and the availability of in person appointments. The patient expressed understanding and agreed to proceed.   I discussed the assessment and treatment plan with the patient. The patient was provided an opportunity to ask questions and all were answered. The patient agreed with the plan and demonstrated an understanding of the instructions.   The patient was advised to call back or seek an in-person evaluation if the symptoms worsen or if the condition fails to improve as anticipated.  BH MD OP Progress Note  09/29/2020 1:28 PM Kimberly Knapp  MRN:  [id number]  Chief Complaint:  Chief Complaint    Follow-up     HPI: Kimberly Knapp is a 35 year old female, married, lives in [place], currently unemployed, has a history of MDD, GAD, ADHD was evaluated by telemedicine today.  Patient today reports she continues to follow-up with cardiology. For now she was told she could continue the Adderall.  Patient reports she does have anxiety about situational stressors.  Her children are currently home for virtual learning and that is anxiety provoking for her.  She however reports she has good support system.  The Lexapro does help.  She is compliant on the Adderall and reports it as beneficial.  She denies side effects.  Patient denies any suicidality, homicidality or perceptual disturbances.  She reports sleep and appetite as fair  Patient denies any other concerns today.  Visit Diagnosis:    ICD-10-CM   1. MDD (major depressive disorder), recurrent, in full remission (HCC)  F33.42   2. GAD (generalized  anxiety disorder)  F41.1   3. Attention deficit hyperactivity disorder (ADHD), predominantly inattentive type  F90.0 amphetamine-dextroamphetamine (ADDERALL) 20 MG tablet    amphetamine-dextroamphetamine (ADDERALL) 20 MG tablet    Past Psychiatric History: I have reviewed past psychiatric history from my progress note on 10/13/2017.  Past trials of Zoloft, Prozac, Celexa, Wellbutrin, Adderall  Past Medical History:  Past Medical History:  Diagnosis Date  . ADHD (attention deficit hyperactivity disorder)   . Depression   . Goiter   . Herpes genitalis   . Obesity (BMI 30.0-34.9)   . Pregnancy induced hypertension     Past Surgical History:  Procedure Laterality Date  . BREAST ENHANCEMENT SURGERY Bilateral   . CESAREAN SECTION N/A 05/29/2015   Procedure: CESAREAN SECTION;  Surgeon: [name], MD;  Location: [facility] OR[facility];  Service: Obstetrics;  Laterality: N/A;  . CESAREAN SECTION N/A 06/25/2018   Procedure: CESAREAN SECTION, repeat;  Surgeon: [name], Elenora Fender, MD;  Location: [facility] OR[facility];  Service: Obstetrics;  Laterality: N/A;  . DILATION AND CURETTAGE OF UTERUS    . THYROID SURGERY     thyroid biopsy  . WISDOM TOOTH EXTRACTION      Family Psychiatric History: I have reviewed family psychiatric history from my progress note on 10/13/2017  Family History:  Family History  Problem Relation Age of Onset  . Diabetes type II Other   . Lung cancer Other   . Heart disease Other   . Lung cancer Other   . ADD / ADHD Father   . Alcohol abuse Maternal Grandfather  Social History: Reviewed social history from my progress note on 10/13/2017 Social History   Socioeconomic History  . Marital status: Married    Spouse name: Kimberly Knapp   . Number of children: 3  . Years of education: Not on file  . Highest education level: Associate degree: occupational, Scientist, product/process development, or vocational program  Occupational History  . 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 Smoking status:  Former Smoker    Types: Cigarettes    Quit date: 10/13/2017    Years since quitting: 2.9  . Smokeless tobacco: Never Used  Vaping Use  . Vaping Use: Never used  Substance and Sexual Activity  . Alcohol use: Yes    Alcohol/week: 1.0 standard drink    Types: 1 Glasses of wine per week    Comment: occasional   . Drug use: No  . Sexual activity: Yes    Partners: Male    Birth control/protection: None, I.U.D.  Other Topics Concern  . Not on file  Social History Narrative  . Not on file   Social Determinants of Health   Financial Resource Strain: Not on file  Food Insecurity: Not on file  Transportation Needs: Not on file  Physical Activity: Not on file  Stress: Not on file  Social Connections: Not on file    Allergies:  Allergies  Allergen Reactions  . Iodine Swelling  . Shrimp [Shellfish Allergy] Swelling    Metabolic Disorder Labs: No results found for: HGBA1C, MPG No results found for: PROLACTIN No results found for: CHOL, TRIG, HDL, CHOLHDL, VLDL, LDLCALC Lab Results  Component Value Date   TSH 0.676 08/20/2020   TSH 0.423 (L) 09/26/2017    Therapeutic Level Labs: No results found for: LITHIUM No results found for: VALPROATE No components found for:  CBMZ  Current Medications: Current Outpatient Medications  Medication Sig Dispense Refill  . amphetamine-dextroamphetamine (ADDERALL) 20 MG tablet TAKE 1.5 TABLETS (30 MG TOTAL) BY MOUTH DAILY. TAKE ONE TABLET AM AND HALF TABLET PM.    . amphetamine-dextroamphetamine (ADDERALL) 20 MG tablet Take 1.5 tablets (30 mg total) by mouth daily. Take 1 tablet AM and half tablet PM 45 tablet 0  . [START ON 10/10/2020] amphetamine-dextroamphetamine (ADDERALL) 20 MG tablet Take 1.5 tablets (30 mg total) by mouth daily. Take one tablet AM and half tablet PM. 45 tablet 0  . [START ON 11/08/2020] amphetamine-dextroamphetamine (ADDERALL) 20 MG tablet Take 1.5 tablets (30 mg total) by mouth daily. Take one tablet AM and half tablet  PM 45 tablet 0  . escitalopram (LEXAPRO) 20 MG tablet Take 1 tablet (20 mg total) by mouth daily. 90 tablet 0  . fluticasone (FLONASE) 50 MCG/ACT nasal spray Place 2 sprays into both nostrils daily. 16 g 6  . hydrOXYzine (ATARAX/VISTARIL) 25 MG tablet TAKE 1 TABLET (25 MG TOTAL) BY MOUTH DAILY AS NEEDED. FOR SEVERE ANXIETY ATTACKS ONLY (Patient not taking: Reported on 08/20/2020) 90 tablet 1   No current facility-administered medications for this visit.     Musculoskeletal: Strength & Muscle Tone: UTA Gait & Station: UTA Patient leans: N/A  Psychiatric Specialty Exam: Review of Systems  Psychiatric/Behavioral: The patient is nervous/anxious.   All other systems reviewed and are negative.   There were no vitals taken for this visit.There is no height or weight on file to calculate BMI.  General Appearance: Casual  Eye Contact:  Fair  Speech:  Clear and Coherent  Volume:  Normal  Mood:  Anxious, situational  Affect:  Congruent  Thought Process:  Goal Directed and Descriptions of Associations: Intact  Orientation:  Full (Time, Place, and Person)  Thought Content: Logical   Suicidal Thoughts:  No  Homicidal Thoughts:  No  Memory:  Immediate;   Fair Recent;   Fair Remote;   Fair  Judgement:  Fair  Insight:  Fair  Psychomotor Activity:  Normal  Concentration:  Concentration: Fair and Attention Span: Fair  Recall:  [employer] of Knowledge: Fair  Language: Fair  Akathisia:  No  Handed:  Right  AIMS (if indicated): UTA  Assets:  Communication Skills Desire for Improvement Housing Social Support  ADL's:  Intact  Cognition: WNL  Sleep:  Fair   Screenings: PHQ2-9   Flowsheet Row Office Visit from 08/20/2020 in [facility]  PHQ-2 Total Score 0  PHQ-9 Total Score 6       Assessment and Plan: JOHNNIE MOTEN is a 35 year old Caucasian female who has a history of MDD, GAD, ADD was evaluated by telemedicine today.  Patient is currently struggling with cardiac  problems possible premature atrial contractions is currently under the care of cardiology.  Patient does have anxiety however overall is coping well.  Plan as noted below.  Plan MDD in remission Lexapro as prescribed Trazodone 25 to 50 mg p.o. nightly as needed-she rarely uses it.  GAD-stable Lexapro 20 mg p.o. daily  ADHD-stable Continue Adderall 30 mg p.o. daily divided dosage. I have provided 2 prescriptions with date specified last one to be filled on or after 11/08/2020. I have reviewed cardiology notes per Dr.Paraschos -dated 09/02/2020.  We will continue Adderall for now until further follow-up with cardiology.  Follow-up in clinic in 2 months or sooner if needed.  I have spent atleast 20 minutes face to face by video with patient today. More than 50 % of the time was spent for preparing to see the patient ( e.g., review of test, records ), obtaining and to review and separately obtained history , ordering medications and test ,psychoeducation and supportive psychotherapy and care coordination,as well as documenting clinical information in electronic health record. This note was generated in part or whole with voice recognition software. Voice recognition is usually quite accurate but there are transcription errors that can and very often do occur. I apologize for any typographical errors that were not detected and corrected.        Jomarie Longs, MD 09/29/2020, 1:28 PM

## 2020-10-09 ENCOUNTER — Telehealth: Payer: Self-pay

## 2020-10-09 DIAGNOSIS — F900 Attention-deficit hyperactivity disorder, predominantly inattentive type: Secondary | ICD-10-CM

## 2020-10-09 MED ORDER — AMPHETAMINE-DEXTROAMPHETAMINE 20 MG PO TABS: 30.0000 mg | ORAL_TABLET | Freq: Every day | ORAL | 0 refills | Status: DC

## 2020-10-09 NOTE — Telephone Encounter (Signed)
I have sent Adderall to pharmacy early per her request.

## 2020-10-09 NOTE — Telephone Encounter (Signed)
  pt called left message that she not able to get medication until tomorrow . but with them calling for snow she was wondering if youwould send a new rx so she can pick up today     amphetamine-dextroamphetamine (ADDERALL) 20 MG tablet Medication Date: 09/29/2020 Department: [facility] Ordering/Authorizing: Jomarie Longs, MD    Order Providers  Prescribing Provider Encounter Provider  Jomarie Longs, MD Jomarie Longs, MD   Outpatient Medication Detail   Disp Refills Start End   amphetamine-dextroamphetamine (ADDERALL) 20 MG tablet 45 tablet 0 10/10/2020    Sig - Route: Take 1.5 tablets (30 mg total) by mouth daily. Take one tablet AM and half tablet PM. - Oral   Sent to pharmacy as: amphetamine-dextroamphetamine (ADDERALL) 20 MG tablet   Earliest Fill Date: 10/10/2020   Notes to Pharmacy: To be filled on or after 10/10/2020   E-Prescribing Status: Receipt confirmed by pharmacy (09/29/2020 9:13 AM EST)    Associated Diagnoses  Attention deficit hyperactivity disorder (ADHD), predominantly inattentive type      Pharmacy  [id number] [place], [place] - [address]

## 2020-10-20 ENCOUNTER — Other Ambulatory Visit: Payer: Self-pay | Admitting: Psychiatry

## 2020-10-20 DIAGNOSIS — F3342 Major depressive disorder, recurrent, in full remission: Secondary | ICD-10-CM

## 2020-10-20 DIAGNOSIS — F411 Generalized anxiety disorder: Secondary | ICD-10-CM

## 2020-11-25 ENCOUNTER — Telehealth (INDEPENDENT_AMBULATORY_CARE_PROVIDER_SITE_OTHER): Payer: 59 | Admitting: Psychiatry

## 2020-11-25 ENCOUNTER — Other Ambulatory Visit: Payer: Self-pay

## 2020-11-25 ENCOUNTER — Telehealth: Payer: Self-pay

## 2020-11-25 ENCOUNTER — Encounter: Payer: Self-pay | Admitting: Psychiatry

## 2020-11-25 DIAGNOSIS — F900 Attention-deficit hyperactivity disorder, predominantly inattentive type: Secondary | ICD-10-CM | POA: Diagnosis not present

## 2020-11-25 DIAGNOSIS — Z79899 Other long term (current) drug therapy: Secondary | ICD-10-CM | POA: Diagnosis not present

## 2020-11-25 DIAGNOSIS — F411 Generalized anxiety disorder: Secondary | ICD-10-CM

## 2020-11-25 DIAGNOSIS — F3342 Major depressive disorder, recurrent, in full remission: Secondary | ICD-10-CM | POA: Diagnosis not present

## 2020-11-25 MED ORDER — AMPHETAMINE-DEXTROAMPHETAMINE 20 MG PO TABS: 30.0000 mg | ORAL_TABLET | Freq: Every day | ORAL | 0 refills | Status: DC

## 2020-11-25 NOTE — Progress Notes (Signed)
Virtual Visit via Video Note  I connected with Kimberly Knapp on 11/25/20 at 10:00 AM EDT by a video enabled telemedicine application and verified that I am speaking with the correct person using two identifiers.  Location Provider Location : [facility] Patient Location : Home  Participants: Patient , Provider   I discussed the limitations of evaluation and management by telemedicine and the availability of in person appointments. The patient expressed understanding and agreed to proceed.   I discussed the assessment and treatment plan with the patient. The patient was provided an opportunity to ask questions and all were answered. The patient agreed with the plan and demonstrated an understanding of the instructions.   The patient was advised to call back or seek an in-person evaluation if the symptoms worsen or if the condition fails to improve as anticipated.  BH MD OP Progress Note  11/25/2020 10:22 AM Kimberly Knapp  MRN:  [id number]  Chief Complaint:  Chief Complaint    Follow-up; ADD; Anxiety     HPI: Kimberly Knapp is a 35 year old female, married, lives in [place], currently unemployed, has a history of MDD, GAD, ADHD was evaluated by telemedicine today.  Patient today reports she is currently struggling with sinusitis and an ear infection.  She is currently on Augmentin.  She reports she is making progress with the same.  She reports otherwise she is doing okay.  Denies any significant depression or anxiety symptoms.  She reports her attention and focus is good on the Adderall.  Denies side effects.  She reports sleep is good.  She does report loss of appetite due to her recent upper respiratory tract infection symptoms.  However overall she was doing okay prior to that.  Patient denies any suicidality, homicidality or perceptual disturbances.  Patient denies any other concerns today.  Visit Diagnosis:    ICD-10-CM   1. MDD (major depressive disorder), recurrent,  in full remission (HCC)  F33.42   2. GAD (generalized anxiety disorder)  F41.1   3. Attention deficit hyperactivity disorder (ADHD), predominantly inattentive type  F90.0 amphetamine-dextroamphetamine (ADDERALL) 20 MG tablet  4. High risk medication use  Z79.899 Urine drugs of abuse scrn w alc, routine (Ref Lab)    Past Psychiatric History: I have reviewed past psychiatric history from my progress note on 10/13/2017.  Past trials of Zoloft, Prozac, Celexa, Wellbutrin, Haldol  Past Medical History:  Past Medical History:  Diagnosis Date  . ADHD (attention deficit hyperactivity disorder)   . Depression   . Goiter   . Herpes genitalis   . Obesity (BMI 30.0-34.9)   . Pregnancy induced hypertension     Past Surgical History:  Procedure Laterality Date  . BREAST ENHANCEMENT SURGERY Bilateral   . CESAREAN SECTION N/A 05/29/2015   Procedure: CESAREAN SECTION;  Surgeon: [name], MD;  Location: [facility] ORS;  Service: Obstetrics;  Laterality: N/A;  . CESAREAN SECTION N/A 06/25/2018   Procedure: CESAREAN SECTION, repeat;  Surgeon: [name], Elenora Fender, MD;  Location: [facility] ORS;  Service: Obstetrics;  Laterality: N/A;  . DILATION AND CURETTAGE OF UTERUS    . THYROID SURGERY     thyroid biopsy  . WISDOM TOOTH EXTRACTION      Family Psychiatric History: I have reviewed family psychiatric history from my progress note on 10/13/2017.  Family History:  Family History  Problem Relation Age of Onset  . Diabetes type II Other   . Lung cancer Other   . Heart disease Other   .  Lung cancer Other   . ADD / ADHD Father   . Alcohol abuse Maternal Grandfather     Social History: Reviewed social history from my progress note on 10/13/2017. Social History   Socioeconomic History  . Marital status: Married    Spouse name: michael   . Number of children: 3  . Years of education: Not on file  . Highest education level: Associate degree: occupational, Scientist, product/process development, or vocational program  Occupational History   . 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 Smoking status: Former Smoker    Types: Cigarettes    Quit date: 10/13/2017    Years since quitting: 3.1  . Smokeless tobacco: Never Used  Vaping Use  . Vaping Use: Never used  Substance and Sexual Activity  . Alcohol use: Yes    Alcohol/week: 1.0 standard drink    Types: 1 Glasses of wine per week    Comment: occasional   . Drug use: No  . Sexual activity: Yes    Partners: Male    Birth control/protection: None, I.U.D.  Other Topics Concern  . Not on file  Social History Narrative  . Not on file   Social Determinants of Health   Financial Resource Strain: Not on file  Food Insecurity: Not on file  Transportation Needs: Not on file  Physical Activity: Not on file  Stress: Not on file  Social Connections: Not on file    Allergies:  Allergies  Allergen Reactions  . Iodine Swelling  . Shrimp [Shellfish Allergy] Swelling    Metabolic Disorder Labs: No results found for: HGBA1C, MPG No results found for: PROLACTIN No results found for: CHOL, TRIG, HDL, CHOLHDL, VLDL, LDLCALC Lab Results  Component Value Date   TSH 0.676 08/20/2020   TSH 0.423 (L) 09/26/2017    Therapeutic Level Labs: No results found for: LITHIUM No results found for: VALPROATE No components found for:  CBMZ  Current Medications: Current Outpatient Medications  Medication Sig Dispense Refill  . cyclobenzaprine (FLEXERIL) 5 MG tablet Take 1 tablet by mouth 3 (three) times daily as needed.    . valACYclovir (VALTREX) 500 MG tablet Take 1 tablet by mouth 2 (two) times daily.    Marland Kitchen amoxicillin-clavulanate (AUGMENTIN) 875-125 MG tablet SMARTSIG:1 Tablet(s) By Mouth Every 12 Hours    . amphetamine-dextroamphetamine (ADDERALL) 20 MG tablet TAKE 1.5 TABLETS (30 MG TOTAL) BY MOUTH DAILY. TAKE ONE TABLET AM AND HALF TABLET PM.    . amphetamine-dextroamphetamine (ADDERALL) 20 MG tablet Take 1.5 tablets (30 mg total) by mouth daily. Take 1  tablet AM and half tablet PM 45 tablet 0  . amphetamine-dextroamphetamine (ADDERALL) 20 MG tablet Take 1.5 tablets (30 mg total) by mouth daily. Take one tablet AM and half tablet PM 45 tablet 0  . [START ON 12/10/2020] amphetamine-dextroamphetamine (ADDERALL) 20 MG tablet Take 1.5 tablets (30 mg total) by mouth daily. Take one tablet AM and half tablet PM. 45 tablet 0  . brompheniramine-pseudoephedrine-DM 30-2-10 MG/5ML syrup Take 10 mLs by mouth every 6 (six) hours.    Marland Kitchen escitalopram (LEXAPRO) 20 MG tablet TAKE 1 TABLET BY MOUTH EVERY DAY 90 tablet 0  . fluticasone (FLONASE) 50 MCG/ACT nasal spray Place 2 sprays into both nostrils daily. 16 g 6  . hydrOXYzine (ATARAX/VISTARIL) 25 MG tablet TAKE 1 TABLET (25 MG TOTAL) BY MOUTH DAILY AS NEEDED. FOR SEVERE ANXIETY ATTACKS ONLY (Patient not taking: Reported on 08/20/2020) 90 tablet 1   No current facility-administered medications for  this visit.     Musculoskeletal: Strength & Muscle Tone: UTA Gait & Station: UTA Patient leans: N/A  Psychiatric Specialty Exam: Review of Systems  HENT: Positive for congestion and sinus pressure.   Psychiatric/Behavioral: Negative for agitation, behavioral problems, confusion, decreased concentration, dysphoric mood, hallucinations, self-injury, sleep disturbance and suicidal ideas. The patient is not nervous/anxious and is not hyperactive.     There were no vitals taken for this visit.There is no height or weight on file to calculate BMI.  General Appearance: Casual  Eye Contact:  Fair  Speech:  Normal Rate  Volume:  Normal  Mood:  Euthymic  Affect:  Congruent  Thought Process:  Goal Directed and Descriptions of Associations: Intact  Orientation:  Full (Time, Place, and Person)  Thought Content: Logical   Suicidal Thoughts:  No  Homicidal Thoughts:  No  Memory:  Immediate;   Fair Recent;   Fair Remote;   Fair  Judgement:  Fair  Insight:  Fair  Psychomotor Activity:  Normal  Concentration:   Concentration: Fair and Attention Span: Fair  Recall:  [employer] of Knowledge: Fair  Language: Fair  Akathisia:  No  Handed:  Right  AIMS (if indicated): UTA  Assets:  Communication Skills Desire for Improvement Housing Social Support Talents/Skills Transportation  ADL's:  Intact  Cognition: WNL  Sleep:  Fair   Screenings: PHQ2-9   Flowsheet Row Video Visit from 11/25/2020 in [facility] Office Visit from 08/20/2020 in [facility]  PHQ-2 Total Score 0 0  PHQ-9 Total Score -- 6    Flowsheet Row Video Visit from 11/25/2020 in [facility]  C-SSRS RISK CATEGORY No Risk       Assessment and Plan: Kimberly Knapp is a 35 year old Caucasian female who has a history of MDD, GAD, ADD was evaluated by telemedicine today.  Patient is currently stable on medications.  Plan as noted below.  Plan MDD in remission Lexapro as prescribed Trazodone 25 to 50 mg p.o. nightly as needed  GAD-stable Lexapro 20 mg p.o. daily  ADHD-stable Adderall 30 mg p.o. daily divided dosage I have provided 1 prescription to be filled on or after 12/10/2020. I have reviewed  controlled substance database  High risk medication use-will order UDS.  Will fax to [facility] lab.  After reviewing her lab result will send prescription for the month of April.  Patient to continue to follow-up with her providers for her upper respiratory tract infection symptoms.  Follow-up in clinic in 2 to 3 months or sooner if needed.  This note was generated in part or whole with voice recognition software. Voice recognition is usually quite accurate but there are transcription errors that can and very often do occur. I apologize for any typographical errors that were not detected and corrected.        Jomarie Longs, MD 11/26/2020, 8:22 AM

## 2020-11-25 NOTE — Telephone Encounter (Signed)
faxed and confirmed the order for urine drugs scrreen dx: z79.899 sent to [facility] lab

## 2020-12-10 ENCOUNTER — Telehealth: Payer: Self-pay | Admitting: Psychiatry

## 2020-12-10 LAB — URINE DRUGS OF ABUSE SCREEN W ALC, ROUTINE (REF LAB)
Amphetamines, Urine: NEGATIVE ng/mL
Barbiturate Quant, Ur: NEGATIVE ng/mL
Benzodiazepine Quant, Ur: NEGATIVE ng/mL
Cannabinoid Quant, Ur: NEGATIVE ng/mL
Cocaine (Metab.): NEGATIVE ng/mL
Ethanol, Urine: NEGATIVE %
Methadone Screen, Urine: NEGATIVE ng/mL
Opiate Quant, Ur: NEGATIVE ng/mL
PCP Quant, Ur: NEGATIVE ng/mL
Propoxyphene: NEGATIVE ng/mL

## 2020-12-10 NOTE — Telephone Encounter (Signed)
Reviewed urine drug screen-negative for amphetamines-unexpected.  Negative for other drugs as expected.  Attempted to contact patient to discuss this prior to sending Adderall to the pharmacy as discussed during last session.  Left voicemail.

## 2020-12-11 ENCOUNTER — Telehealth: Payer: Self-pay

## 2020-12-11 NOTE — Telephone Encounter (Signed)
Returned call to patient.  Left voicemail.  Patient did not return my call however when I call her back it went to voicemail again.  We will try to call her back later. Reason for call was to discuss her urine drug screen.

## 2020-12-11 NOTE — Telephone Encounter (Signed)
pt returning your call

## 2020-12-23 ENCOUNTER — Telehealth: Payer: Self-pay | Admitting: Psychiatry

## 2020-12-23 DIAGNOSIS — F900 Attention-deficit hyperactivity disorder, predominantly inattentive type: Secondary | ICD-10-CM

## 2020-12-23 MED ORDER — AMPHETAMINE-DEXTROAMPHETAMINE 20 MG PO TABS: 30.0000 mg | ORAL_TABLET | Freq: Every day | ORAL | 0 refills | Status: DC

## 2020-12-23 NOTE — Telephone Encounter (Signed)
I have sent Adderall to pharmacy for next refill with date specified.

## 2021-02-03 ENCOUNTER — Telehealth: Payer: 59 | Admitting: Psychiatry

## 2021-02-03 ENCOUNTER — Other Ambulatory Visit: Payer: Self-pay

## 2021-02-08 DIAGNOSIS — M545 Low back pain, unspecified: Secondary | ICD-10-CM | POA: Insufficient documentation

## 2021-02-08 DIAGNOSIS — G8929 Other chronic pain: Secondary | ICD-10-CM | POA: Insufficient documentation

## 2021-02-12 ENCOUNTER — Telehealth: Payer: Self-pay

## 2021-02-12 DIAGNOSIS — F900 Attention-deficit hyperactivity disorder, predominantly inattentive type: Secondary | ICD-10-CM

## 2021-02-12 MED ORDER — AMPHETAMINE-DEXTROAMPHETAMINE 20 MG PO TABS
30.0000 mg | ORAL_TABLET | Freq: Every day | ORAL | 0 refills | Status: DC
Start: 2021-02-12 — End: 2021-04-21

## 2021-02-12 NOTE — Telephone Encounter (Signed)
I have sent Adderall to pharmacy. ?

## 2021-02-12 NOTE — Telephone Encounter (Signed)
pt needs refills on adderall

## 2021-02-17 ENCOUNTER — Encounter: Payer: Self-pay | Admitting: Psychiatry

## 2021-02-17 ENCOUNTER — Other Ambulatory Visit: Payer: Self-pay

## 2021-02-17 ENCOUNTER — Telehealth (INDEPENDENT_AMBULATORY_CARE_PROVIDER_SITE_OTHER): Payer: 59 | Admitting: Psychiatry

## 2021-02-17 DIAGNOSIS — Z79899 Other long term (current) drug therapy: Secondary | ICD-10-CM | POA: Insufficient documentation

## 2021-02-17 DIAGNOSIS — F3342 Major depressive disorder, recurrent, in full remission: Secondary | ICD-10-CM | POA: Diagnosis not present

## 2021-02-17 DIAGNOSIS — F411 Generalized anxiety disorder: Secondary | ICD-10-CM

## 2021-02-17 DIAGNOSIS — F900 Attention-deficit hyperactivity disorder, predominantly inattentive type: Secondary | ICD-10-CM

## 2021-02-17 MED ORDER — ESCITALOPRAM OXALATE 20 MG PO TABS: 1.0000 | ORAL_TABLET | Freq: Every day | ORAL | 0 refills | Status: DC

## 2021-02-17 NOTE — Progress Notes (Signed)
Virtual Visit via Video Note  I connected with Kimberly Knapp on 02/17/21 at  3:40 PM EDT by a video enabled telemedicine application and verified that I am speaking with the correct person using two identifiers.  Location Provider Location : Office Patient Location : Home  Participants: Patient , Provider    I discussed the limitations of evaluation and management by telemedicine and the availability of in person appointments. The patient expressed understanding and agreed to proceed  I discussed the assessment and treatment plan with the patient. The patient was provided an opportunity to ask questions and all were answered. The patient agreed with the plan and demonstrated an understanding of the instructions.   The patient was advised to call back or seek an in-person evaluation if the symptoms worsen or if the condition fails to improve as anticipated.   BH MD OP Progress Note  02/17/2021 4:28 PM Kimberly Knapp  MRN:  [id number]  Chief Complaint:  Chief Complaint    Follow-up; ADHD; Depression     HPI: Kimberly Knapp is a 35 year old female, married, lives in [place], currently unemployed, has a history of MDD, GAD, ADHD was evaluated by telemedicine today.  Patient reports she is currently at the beach on her [place].  She reports she struggled with low back pain a few days ago however is currently doing well.  She was prescribed tramadol, Flexeril and she uses it as needed.  Patient reports overall she is doing well with regards to her mood.  Denies any significant depression.  Denies any anxiety.  Reports sleep is overall good at this time.  She is compliant on her medications as prescribed.  Denies side effects.  Patient denies any suicidality, homicidality or perceptual disturbances.  Reviewed and discussed recent urine drug screen with patient.  Patient denies any other concerns today.  Visit Diagnosis:    ICD-10-CM   1. MDD (major depressive disorder),  recurrent, in full remission (HCC)  F33.42 escitalopram (LEXAPRO) 20 MG tablet  2. GAD (generalized anxiety disorder)  F41.1 escitalopram (LEXAPRO) 20 MG tablet  3. Attention deficit hyperactivity disorder (ADHD), predominantly inattentive type  F90.0     Past Psychiatric History: I have reviewed past psychiatric history from progress note on 10/13/2017.  Past trials of Zoloft, Prozac, Celexa, Wellbutrin, Adderall  Past Medical History:  Past Medical History:  Diagnosis Date  . ADHD (attention deficit hyperactivity disorder)   . Depression   . Goiter   . Herpes genitalis   . Obesity (BMI 30.0-34.9)   . Pregnancy induced hypertension     Past Surgical History:  Procedure Laterality Date  . BREAST ENHANCEMENT SURGERY Bilateral   . CESAREAN SECTION N/A 05/29/2015   Procedure: CESAREAN SECTION;  Surgeon: [name], MD;  Location: [facility];  Service: Obstetrics;  Laterality: N/A;  . CESAREAN SECTION N/A 06/25/2018   Procedure: CESAREAN SECTION, repeat;  Surgeon: [name], Elenora Fender, MD;  Location: [facility] OR[facility];  Service: Obstetrics;  Laterality: N/A;  . DILATION AND CURETTAGE OF UTERUS    . THYROID SURGERY     thyroid biopsy  . WISDOM TOOTH EXTRACTION      Family Psychiatric History: I have reviewed family psychiatric history from progress note on 10/13/2017  Family History:  Family History  Problem Relation Age of Onset  . Diabetes type II Other   . Lung cancer Other   . Heart disease Other   . Lung cancer Other   . ADD / ADHD Father   .  Alcohol abuse Maternal Grandfather     Social History: Reviewed social history from progress note on 10/13/2017 Social History   Socioeconomic History  . Marital status: Married    Spouse name: michael   . Number of children: 3  . Years of education: Not on file  . Highest education level: Associate degree: occupational, Scientist, product/process development, or vocational program  Occupational History  . 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  Smoking status: Former Smoker    Types: Cigarettes    Quit date: 10/13/2017    Years since quitting: 3.3  . Smokeless tobacco: Never Used  Vaping Use  . Vaping Use: Never used  Substance and Sexual Activity  . Alcohol use: Yes    Alcohol/week: 1.0 standard drink    Types: 1 Glasses of wine per week    Comment: occasional   . Drug use: No  . Sexual activity: Yes    Partners: Male    Birth control/protection: None, I.U.D.  Other Topics Concern  . Not on file  Social History Narrative  . Not on file   Social Determinants of Health   Financial Resource Strain: Not on file  Food Insecurity: Not on file  Transportation Needs: Not on file  Physical Activity: Not on file  Stress: Not on file  Social Connections: Not on file    Allergies:  Allergies  Allergen Reactions  . Iodine Swelling  . Shrimp [Shellfish Allergy] Swelling    Metabolic Disorder Labs: No results found for: HGBA1C, MPG No results found for: PROLACTIN No results found for: CHOL, TRIG, HDL, CHOLHDL, VLDL, LDLCALC Lab Results  Component Value Date   TSH 0.676 08/20/2020   TSH 0.423 (L) 09/26/2017    Therapeutic Level Labs: No results found for: LITHIUM No results found for: VALPROATE No components found for:  CBMZ  Current Medications: Current Outpatient Medications  Medication Sig Dispense Refill  . amphetamine-dextroamphetamine (ADDERALL) 20 MG tablet TAKE 1.5 TABLETS (30 MG TOTAL) BY MOUTH DAILY. TAKE ONE TABLET AM AND HALF TABLET PM.    . amphetamine-dextroamphetamine (ADDERALL) 20 MG tablet Take 1.5 tablets (30 mg total) by mouth daily. Take one tablet AM and half tablet PM. 45 tablet 0  . amphetamine-dextroamphetamine (ADDERALL) 20 MG tablet Take 1.5 tablets (30 mg total) by mouth daily. Take 1 tablet AM and half tablet PM 45 tablet 0  . amphetamine-dextroamphetamine (ADDERALL) 20 MG tablet Take 1.5 tablets (30 mg total) by mouth daily. Take one tablet AM and half tablet PM 45 tablet 0  .  cyclobenzaprine (FLEXERIL) 10 MG tablet Take 1 tablet by mouth 3 (three) times daily.    . fluticasone (FLONASE) 50 MCG/ACT nasal spray Place 2 sprays into both nostrils daily. 16 g 6  . hydrOXYzine (ATARAX/VISTARIL) 25 MG tablet TAKE 1 TABLET (25 MG TOTAL) BY MOUTH DAILY AS NEEDED. FOR SEVERE ANXIETY ATTACKS ONLY 90 tablet 1  . meloxicam (MOBIC) 15 MG tablet Take 1 tablet by mouth daily.    . traMADol (ULTRAM) 50 MG tablet Take 50 mg by mouth every 6 (six) hours.    . valACYclovir (VALTREX) 500 MG tablet Take 1 tablet by mouth 2 (two) times daily.    Marland Kitchen amoxicillin-clavulanate (AUGMENTIN) 875-125 MG tablet SMARTSIG:1 Tablet(s) By Mouth Every 12 Hours (Patient not taking: Reported on 02/17/2021)    . brompheniramine-pseudoephedrine-DM 30-2-10 MG/5ML syrup Take 10 mLs by mouth every 6 (six) hours. (Patient not taking: Reported on 02/17/2021)    . escitalopram (LEXAPRO)  20 MG tablet Take 1 tablet (20 mg total) by mouth daily. 90 tablet 0   No current facility-administered medications for this visit.     Musculoskeletal: Strength & Muscle Tone: UTA Gait & Station: UTA Patient leans: N/A  Psychiatric Specialty Exam: Review of Systems  Psychiatric/Behavioral: Negative for agitation, behavioral problems, confusion, decreased concentration, dysphoric mood, hallucinations, self-injury, sleep disturbance and suicidal ideas. The patient is not nervous/anxious and is not hyperactive.   All other systems reviewed and are negative.   There were no vitals taken for this visit.There is no height or weight on file to calculate BMI.  General Appearance: Casual  Eye Contact:  Fair  Speech:  Clear and Coherent  Volume:  Normal  Mood:  Euthymic  Affect:  Congruent  Thought Process:  Goal Directed and Descriptions of Associations: Intact  Orientation:  Full (Time, Place, and Person)  Thought Content: Logical   Suicidal Thoughts:  No  Homicidal Thoughts:  No  Memory:  Immediate;   Fair Recent;    Fair Remote;   Good  Judgement:  Fair  Insight:  Fair  Psychomotor Activity:  Normal  Concentration:  Concentration: Fair and Attention Span: Fair  Recall:  [employer] of Knowledge: Fair  Language: Fair  Akathisia:  No  Handed:  Right  AIMS (if indicated): UTA  Assets:  Communication Skills Desire for Improvement Housing Social Support  ADL's:  Intact  Cognition: WNL  Sleep:  Fair   Screenings: GAD-7   Flowsheet Row Video Visit from 02/17/2021 in [facility]  Total GAD-7 Score 0    PHQ2-9   Flowsheet Row Video Visit [facility] 02/17/2021 in [facility] Video Visit from 11/25/2020 in [facility] Office Visit from 08/20/2020 in [facility]  PHQ-2 Total Score 0 0 0  PHQ-9 Total Score -- -- 6    Flowsheet Row Video Visit from 11/25/2020 in [facility]  C-SSRS RISK CATEGORY No Risk       Assessment and Plan: ETHAL GOTAY is a 35 year old Caucasian female who has a history of MDD, GAD, ADD was evaluated by telemedicine today.  Patient is currently stable.  Plan as noted below.  Plan MDD in remission Lexapro was prescribed Trazodone 25-50 mg p.o. nightly as needed  GAD-stable Lexapro 20 mg p.o. daily  ADHD-stable Adderall 30 mg p.o. daily in divided dosage Reviewed urine drug screen-dated- 12/09/2020 discussed with patient.  Negative for any illicit drugs.  Patient reports she is going out of town end of June and may need her prescription early.  She will call me mid June to discuss the exact date. We will send prescriptions at that time.  Patient is interested in transitioning care to primary care provider.  She will establish care with PMD and let writer know.  Follow-up in clinic in office in 2 months.  This note was generated in part or whole with voice recognition software. Voice recognition is usually quite accurate but there are transcription  errors that can and very often do occur. I apologize for any typographical errors that were not detected and corrected.      Kimberly Longs, MD 02/17/2021, 4:28 PM

## 2021-03-03 ENCOUNTER — Telehealth: Payer: Self-pay

## 2021-03-03 DIAGNOSIS — F900 Attention-deficit hyperactivity disorder, predominantly inattentive type: Secondary | ICD-10-CM

## 2021-03-03 MED ORDER — AMPHETAMINE-DEXTROAMPHETAMINE 20 MG PO TABS: 30.0000 mg | ORAL_TABLET | Freq: Every day | ORAL | 0 refills | Status: DC

## 2021-03-03 NOTE — Telephone Encounter (Signed)
pt going on vacation on june 27th she stated that you was going to refill her medications before she left on vacation.

## 2021-03-03 NOTE — Telephone Encounter (Signed)
I have sent Adderall to pharmacy to be filled early

## 2021-03-26 ENCOUNTER — Encounter: Payer: Self-pay | Admitting: Nurse Practitioner

## 2021-03-26 ENCOUNTER — Telehealth: Payer: Self-pay

## 2021-03-26 ENCOUNTER — Other Ambulatory Visit: Payer: Self-pay

## 2021-03-26 ENCOUNTER — Ambulatory Visit: Payer: Self-pay

## 2021-03-26 ENCOUNTER — Telehealth (INDEPENDENT_AMBULATORY_CARE_PROVIDER_SITE_OTHER): Payer: 59 | Admitting: Nurse Practitioner

## 2021-03-26 DIAGNOSIS — U071 COVID-19: Secondary | ICD-10-CM

## 2021-03-26 MED ORDER — MOLNUPIRAVIR EUA 200MG CAPSULE
4.0000 | ORAL_CAPSULE | Freq: Two times a day (BID) | ORAL | 0 refills | Status: AC
  Filled 2021-03-26: qty 40, 30d supply, fill #0

## 2021-03-26 NOTE — Telephone Encounter (Signed)
FYI

## 2021-03-26 NOTE — Telephone Encounter (Signed)
Pt. Tested positive for COVID 19 - home test. Would like an anti-viral Virtual appointment today.

## 2021-03-26 NOTE — Telephone Encounter (Signed)
Called pt to collect copay for today's appt no answer left vm  

## 2021-03-26 NOTE — Progress Notes (Signed)
There were no vitals taken for this visit.   Subjective:    Patient ID: Kimberly Knapp, female    DOB: 12/17/1985, 35 y.o.   MRN: [id number]  HPI: Kimberly Knapp is a 35 y.o. female  Chief Complaint  Patient presents with   Headache    Patient states she is having headache, tested positive for COVID today. Symptoms began last night.    Cough    Patient states she is coughing and coughing up sputum    COVID POSITIVE Patient states she took a home test and was positive for COVID 19.  Patient states she is having productive cough, headache, sore throat Denies SOB, CP, chest tightness, wheezing, and fever.   Relevant past medical, surgical, family and social history reviewed and updated as indicated. Interim medical history since our last visit reviewed. Allergies and medications reviewed and updated.  Review of Systems  Constitutional:  Negative for fever.  HENT:  Positive for sore throat.   Respiratory:  Positive for cough. Negative for chest tightness, shortness of breath and wheezing.   Cardiovascular:  Negative for chest pain.  Neurological:  Positive for headaches.   Per HPI unless specifically indicated above     Objective:    There were no vitals taken for this visit.  Wt Readings from Last 3 Encounters:  08/20/20 230 lb 12.8 oz (104.7 kg)  02/06/20 240 lb (108.9 kg)  06/25/18 259 lb 11.2 oz (117.8 kg)    Physical Exam Vitals and nursing note reviewed.  Constitutional:      General: She is not in acute distress.    Appearance: Normal appearance. She is normal weight. She is not ill-appearing, toxic-appearing or diaphoretic.  HENT:     Head: Normocephalic.     Right Ear: External ear normal.     Left Ear: External ear normal.     Nose: Nose normal.     Mouth/Throat:     Mouth: Mucous membranes are moist.     Pharynx: Oropharynx is clear.  Eyes:     General:        Right eye: No discharge.        Left eye: No discharge.     Extraocular Movements:  Extraocular movements intact.     Conjunctiva/sclera: Conjunctivae normal.     Pupils: Pupils are equal, round, and reactive to light.  Cardiovascular:     Rate and Rhythm: Normal rate and regular rhythm.     Heart sounds: No murmur heard. Pulmonary:     Effort: Pulmonary effort is normal. No respiratory distress.     Breath sounds: Normal breath sounds. No wheezing or rales.  Musculoskeletal:     Cervical back: Normal range of motion and neck supple.  Skin:    General: Skin is warm and dry.     Capillary Refill: Capillary refill takes less than 2 seconds.  Neurological:     General: No focal deficit present.     Mental Status: She is alert and oriented to person, place, and time. Mental status is at baseline.  Psychiatric:        Mood and Affect: Mood normal.        Behavior: Behavior normal.        Thought Content: Thought content normal.        Judgment: Judgment normal.    Results for orders placed or performed in visit on 08/20/20  CBC with Differential/Platelet  Result Value Ref Range   WBC 7.2 3.4 -  10.8 x10E3/uL   RBC 4.65 3.77 - 5.28 x10E6/uL   Hemoglobin 14.9 11.1 - 15.9 g/dL   Hematocrit 94.8 54.6 - 46.6 %   MCV 94 79 - 97 fL   MCH 32.0 26.6 - 33.0 pg   MCHC 34.3 31.5 - 35.7 g/dL   RDW 27.0 35.0 - 09.3 %   Platelets 344 150 - 450 x10E3/uL   Neutrophils 49 Not Estab. %   Lymphs 42 Not Estab. %   Monocytes 6 Not Estab. %   Eos 2 Not Estab. %   Basos 1 Not Estab. %   Neutrophils Absolute 3.6 1.4 - 7.0 x10E3/uL   Lymphocytes Absolute 3.0 0.7 - 3.1 x10E3/uL   Monocytes Absolute 0.4 0.1 - 0.9 x10E3/uL   EOS (ABSOLUTE) 0.2 0.0 - 0.4 x10E3/uL   Basophils Absolute 0.0 0.0 - 0.2 x10E3/uL   Immature Granulocytes 0 Not Estab. %   Immature Grans (Abs) 0.0 0.0 - 0.1 x10E3/uL  Comprehensive metabolic panel  Result Value Ref Range   Glucose 92 65 - 99 mg/dL   BUN 11 6 - 20 mg/dL   Creatinine, Ser 8.18 0.57 - 1.00 mg/dL   GFR calc non Af Amer 92 >59 mL/min/1.73   GFR  calc Af Amer 106 >59 mL/min/1.73   BUN/Creatinine Ratio 13 9 - 23   Sodium 139 134 - 144 mmol/L   Potassium 4.0 3.5 - 5.2 mmol/L   Chloride 105 96 - 106 mmol/L   CO2 19 (L) 20 - 29 mmol/L   Calcium 9.5 8.7 - 10.2 mg/dL   Total Protein 6.9 6.0 - 8.5 g/dL   Albumin 4.8 3.8 - 4.8 g/dL   Globulin, Total 2.1 1.5 - 4.5 g/dL   Albumin/Globulin Ratio 2.3 (H) 1.2 - 2.2   Bilirubin Total 0.3 0.0 - 1.2 mg/dL   Alkaline Phosphatase 73 44 - 121 IU/L   AST 17 0 - 40 IU/L   ALT 14 0 - 32 IU/L  TSH  Result Value Ref Range   TSH 0.676 0.450 - 4.500 uIU/mL  PTH, intact (no Ca)  Result Value Ref Range   PTH 31 15 - 65 pg/mL      Assessment & Plan:   Problem List Items Addressed This Visit   None Visit Diagnoses     COVID-19    -  Primary   Molnupiravir sent to the pharmacy. Side effects and benefits discussed. Discussed quarantine x5 days. Discussed s/s to seek higher level of care.   Relevant Medications   molnupiravir EUA 200 mg CAPS        Follow up plan: Return if symptoms worsen or fail to improve.  This visit was completed via MyChart due to the restrictions of the COVID-19 pandemic. All issues as above were discussed and addressed. Physical exam was done as above through visual confirmation on MyChart. If it was felt that the patient should be evaluated in the office, they were directed there. The patient verbally consented to this visit. Location of the patient: Home Location of the provider: Office Those involved with this call:  Provider: Larae Grooms, NP CMA: Alessandra Bevels, CMA Front Desk/Registration: Harriet Pho This encounter was conducted via phone.  I spent 15 dedicated to the care of this patient on the date of this encounter to include previsit review of 21, time with the patient, and post visit ordering of testing.

## 2021-03-26 NOTE — Telephone Encounter (Signed)
      Message from Valora Piccolo sent at 03/26/2021  3:34 PM EDT  Pt is calling because she tested positive for COVID today. Pt is calling to see if she can receive antiviral medications. No available appts until Monday. Pt would like to know is there anything else that she can take to assist her in her sx.       Call History   Type Contact Phone/Fax User  03/26/2021 03:33 PM EDT Phone (Incoming) Kimberly Knapp (Self) [phone] Rexene Edison) Jens Som A    Reason for Disposition  MILD difficulty breathing (e.g., minimal/no SOB at rest, SOB with walking, pulse <100)  Answer Assessment - Initial Assessment Questions 1. COVID-19 DIAGNOSIS: "Who made your COVID-19 diagnosis?" "Was it confirmed by a positive lab test or self-test?" If not diagnosed by a doctor (or NP/PA), ask "Are there lots of cases (community spread) where you live?" Note: See public health department website, if unsure.     Home test 2. COVID-19 EXPOSURE: "Was there any known exposure to COVID before the symptoms began?" CDC Definition of close contact: within 6 feet (2 meters) for a total of 15 minutes or more over a 24-hour period.      Yes 3. ONSET: "When did the COVID-19 symptoms start?"      Last night 4. WORST SYMPTOM: "What is your worst symptom?" (e.g., cough, fever, shortness of breath, muscle aches)     Headache , cough, fatigue, hands ache 5. COUGH: "Do you have a cough?" If Yes, ask: "How bad is the cough?"       Yes 6. FEVER: "Do you have a fever?" If Yes, ask: "What is your temperature, how was it measured, and when did it start?"     100.3 7. RESPIRATORY STATUS: "Describe your breathing?" (e.g., shortness of breath, wheezing, unable to speak)      No 8. BETTER-SAME-WORSE: "Are you getting better, staying the same or getting worse compared to yesterday?"  If getting worse, ask, "In what way?"     Worse 9. HIGH RISK DISEASE: "Do you have any chronic medical problems?" (e.g., asthma, heart or  lung disease, weak immune system, obesity, etc.)     Obesity 10. VACCINE: "Have you had the COVID-19 vaccine?" If Yes, ask: "Which one, how many shots, when did you get it?"       Yes 11. BOOSTER: "Have you received your COVID-19 booster?" If Yes, ask: "Which one and when did you get it?"       No 12. PREGNANCY: "Is there any chance you are pregnant?" "When was your last menstrual period?"       No 13. OTHER SYMPTOMS: "Do you have any other symptoms?"  (e.g., chills, fatigue, headache, loss of smell or taste, muscle pain, sore throat)       Headache 14. O2 SATURATION MONITOR:  "Do you use an oxygen saturation monitor (pulse oximeter) at home?" If Yes, ask "What is your reading (oxygen level) today?" "What is your usual oxygen saturation reading?" (e.g., 95%)       No  Protocols used: Coronavirus (COVID-19) Diagnosed or Suspected-A-AH

## 2021-04-01 ENCOUNTER — Other Ambulatory Visit: Payer: Self-pay

## 2021-04-12 ENCOUNTER — Other Ambulatory Visit: Payer: Self-pay | Admitting: Internal Medicine

## 2021-04-12 ENCOUNTER — Other Ambulatory Visit: Payer: Self-pay

## 2021-04-12 ENCOUNTER — Telehealth: Payer: Self-pay

## 2021-04-12 ENCOUNTER — Ambulatory Visit: Payer: Self-pay | Admitting: *Deleted

## 2021-04-12 ENCOUNTER — Ambulatory Visit (INDEPENDENT_AMBULATORY_CARE_PROVIDER_SITE_OTHER): Payer: 59 | Admitting: Internal Medicine

## 2021-04-12 ENCOUNTER — Encounter: Payer: Self-pay | Admitting: Internal Medicine

## 2021-04-12 VITALS — BP 120/78 | HR 91 | Temp 98.5°F | Ht 67.32 in | Wt 227.4 lb

## 2021-04-12 DIAGNOSIS — H6690 Otitis media, unspecified, unspecified ear: Secondary | ICD-10-CM

## 2021-04-12 DIAGNOSIS — F900 Attention-deficit hyperactivity disorder, predominantly inattentive type: Secondary | ICD-10-CM

## 2021-04-12 MED ORDER — AMOXICILLIN-POT CLAVULANATE 875-125 MG PO TABS: 1.0000 | ORAL_TABLET | Freq: Two times a day (BID) | ORAL | 0 refills | Status: DC

## 2021-04-12 MED ORDER — AMPHETAMINE-DEXTROAMPHETAMINE 20 MG PO TABS: 30.0000 mg | ORAL_TABLET | Freq: Every day | ORAL | 0 refills | Status: DC

## 2021-04-12 MED ORDER — MECLIZINE HCL 25 MG PO TABS: 25.0000 mg | ORAL_TABLET | Freq: Three times a day (TID) | ORAL | 0 refills | Status: DC | PRN

## 2021-04-12 MED ORDER — FEXOFENADINE HCL 180 MG PO TABS: 180.0000 mg | ORAL_TABLET | Freq: Every day | ORAL | 1 refills | Status: DC

## 2021-04-12 NOTE — Telephone Encounter (Signed)
Reason for Disposition  [1] MODERATE dizziness (e.g., vertigo; feels very unsteady, interferes with normal activities) AND [2] has NOT been evaluated by physician for this  Answer Assessment - Initial Assessment Questions 1. DESCRIPTION: "Describe your dizziness."     When turning head, lying down 2. VERTIGO: "Do you feel like either you or the room is spinning or tilting?"      Yes 3. LIGHTHEADED: "Do you feel lightheaded?" (e.g., somewhat faint, woozy, weak upon standing)    Also 4. SEVERITY: "How bad is it?"  "Can you walk?"   - MILD: Feels slightly dizzy and unsteady, but is walking normally.   - MODERATE: Feels unsteady when walking, but not falling; interferes with normal activities (e.g., school, work).   - SEVERE: Unable to walk without falling, or requires assistance to walk without falling.     Varies, mild to moderate, 10 seconds of severe at times at times 5. ONSET:  "When did the dizziness begin?"     Thursday night 6. AGGRAVATING FACTORS: "Does anything make it worse?" (e.g., standing, change in head position)     With turning head, Sitting to lying, sitting to standing, leaning back 7. CAUSE: "What do you think is causing the dizziness?"     Vertigo. 8. RECURRENT SYMPTOM: "Have you had dizziness before?" If Yes, ask: "When was the last time?" "What happened that time?"     Years ago 9. OTHER SYMPTOMS: "Do you have any other symptoms?" (e.g., headache, weakness, numbness, vomiting, earache)     Nausea after "Spinning"  Protocols used: Dizziness - Vertigo-A-AH

## 2021-04-12 NOTE — Telephone Encounter (Signed)
I have sent Adderall to pharmacy. ?

## 2021-04-12 NOTE — Telephone Encounter (Signed)
pt left message that she needs a refill on her medication adderall.

## 2021-04-12 NOTE — Telephone Encounter (Signed)
Routing to provider to advise on message. [facility] nurse spoke with patient, see message below. Any other advice to give to patient?

## 2021-04-12 NOTE — Progress Notes (Signed)
BP 120/78   Pulse 91   Temp 98.5 F (36.9 C) (Oral)   Ht 5' 7.32" (1.71 m)   Wt 227 lb 6.4 oz (103.1 kg)   SpO2 99%   BMI 35.27 kg/m    Subjective:    Patient ID: Kimberly Knapp, female    DOB: 04-Sep-1986, 35 y.o.   MRN: [id number]  Chief Complaint  Patient presents with   Dizziness    Started on Friday, took some dramamine but did not help. Patient feels like she is unsteady when she is walking.    Ear Fullness    Patent states that her ears have been feeling full and like they have water in them. B/L ear    HPI: SYBRINA Knapp is a 35 y.o. female  Denies   Dizziness This is a new (head spins inside her head x friday dropped off kids and felt dizzy,better saturday then worse.) problem. Associated symptoms include coughing, headaches and nausea. Pertinent negatives include no abdominal pain, change in bowel habit, chest pain, fatigue, fever, neck pain, numbness, rash, sore throat, swollen glands, urinary symptoms, vertigo or visual change. Associated symptoms comments: Nasuea+ .  Ear Fullness  There is pain in both ears. Associated symptoms include coughing and headaches. Pertinent negatives include no abdominal pain, neck pain, rash or sore throat.   Chief Complaint  Patient presents with   Dizziness    Started on Friday, took some dramamine but did not help. Patient feels like she is unsteady when she is walking.    Ear Fullness    Patent states that her ears have been feeling full and like they have water in them. B/L ear    Relevant past medical, surgical, family and social history reviewed and updated as indicated. Interim medical history since our last visit reviewed. Allergies and medications reviewed and updated.  Review of Systems  Constitutional:  Negative for fatigue and fever.  HENT:  Negative for sore throat.   Respiratory:  Positive for cough. Negative for apnea, choking, chest tightness and shortness of breath.   Cardiovascular:  Negative for  chest pain.  Gastrointestinal:  Positive for nausea. Negative for abdominal pain and change in bowel habit.  Musculoskeletal:  Negative for neck pain.  Skin:  Negative for rash.  Neurological:  Positive for dizziness and headaches. Negative for vertigo and numbness.   Per HPI unless specifically indicated above     Objective:    BP 120/78   Pulse 91   Temp 98.5 F (36.9 C) (Oral)   Ht 5' 7.32" (1.71 m)   Wt 227 lb 6.4 oz (103.1 kg)   SpO2 99%   BMI 35.27 kg/m   Wt Readings from Last 3 Encounters:  04/12/21 227 lb 6.4 oz (103.1 kg)  08/20/20 230 lb 12.8 oz (104.7 kg)  02/06/20 240 lb (108.9 kg)    Physical Exam Vitals and nursing note reviewed.  Constitutional:      General: She is not in acute distress.    Appearance: Normal appearance. She is not ill-appearing or diaphoretic.  HENT:     Right Ear: External ear normal.     Ears:     Comments: Tympanic membranes inflammed Eyes:     Conjunctiva/sclera: Conjunctivae normal.  Pulmonary:     Breath sounds: No rhonchi.  Abdominal:     General: Abdomen is flat. Bowel sounds are normal. There is no distension.     Palpations: Abdomen is soft. There is no mass.  Tenderness: There is no abdominal tenderness. There is no guarding.  Skin:    General: Skin is warm and dry.     Coloration: Skin is not jaundiced.     Findings: No erythema.  Neurological:     Mental Status: She is alert.    Results for orders placed or performed in visit on 08/20/20  CBC with Differential/Platelet  Result Value Ref Range   WBC 7.2 3.4 - 10.8 x10E3/uL   RBC 4.65 3.77 - 5.28 x10E6/uL   Hemoglobin 14.9 11.1 - 15.9 g/dL   Hematocrit 88.8 91.6 - 46.6 %   MCV 94 79 - 97 fL   MCH 32.0 26.6 - 33.0 pg   MCHC 34.3 31.5 - 35.7 g/dL   RDW 94.5 03.8 - 88.2 %   Platelets 344 150 - 450 x10E3/uL   Neutrophils 49 Not Estab. %   Lymphs 42 Not Estab. %   Monocytes 6 Not Estab. %   Eos 2 Not Estab. %   Basos 1 Not Estab. %   Neutrophils Absolute 3.6  1.4 - 7.0 x10E3/uL   Lymphocytes Absolute 3.0 0.7 - 3.1 x10E3/uL   Monocytes Absolute 0.4 0.1 - 0.9 x10E3/uL   EOS (ABSOLUTE) 0.2 0.0 - 0.4 x10E3/uL   Basophils Absolute 0.0 0.0 - 0.2 x10E3/uL   Immature Granulocytes 0 Not Estab. %   Immature Grans (Abs) 0.0 0.0 - 0.1 x10E3/uL  Comprehensive metabolic panel  Result Value Ref Range   Glucose 92 65 - 99 mg/dL   BUN 11 6 - 20 mg/dL   Creatinine, Ser 8.00 0.57 - 1.00 mg/dL   GFR calc non Af Amer 92 >59 mL/min/1.73   GFR calc Af Amer 106 >59 mL/min/1.73   BUN/Creatinine Ratio 13 9 - 23   Sodium 139 134 - 144 mmol/L   Potassium 4.0 3.5 - 5.2 mmol/L   Chloride 105 96 - 106 mmol/L   CO2 19 (L) 20 - 29 mmol/L   Calcium 9.5 8.7 - 10.2 mg/dL   Total Protein 6.9 6.0 - 8.5 g/dL   Albumin 4.8 3.8 - 4.8 g/dL   Globulin, Total 2.1 1.5 - 4.5 g/dL   Albumin/Globulin Ratio 2.3 (H) 1.2 - 2.2   Bilirubin Total 0.3 0.0 - 1.2 mg/dL   Alkaline Phosphatase 73 44 - 121 IU/L   AST 17 0 - 40 IU/L   ALT 14 0 - 32 IU/L  TSH  Result Value Ref Range   TSH 0.676 0.450 - 4.500 uIU/mL  PTH, intact (no Ca)  Result Value Ref Range   PTH 31 15 - 65 pg/mL        Current Outpatient Medications:    amoxicillin-clavulanate (AUGMENTIN) 875-125 MG tablet, Take 1 tablet by mouth 2 (two) times daily., Disp: 20 tablet, Rfl: 0   amphetamine-dextroamphetamine (ADDERALL) 20 MG tablet, TAKE 1.5 TABLETS (30 MG TOTAL) BY MOUTH DAILY. TAKE ONE TABLET AM AND HALF TABLET PM., Disp: , Rfl:    escitalopram (LEXAPRO) 20 MG tablet, Take 1 tablet (20 mg total) by mouth daily., Disp: 90 tablet, Rfl: 0   fexofenadine (ALLEGRA ALLERGY) 180 MG tablet, Take 1 tablet (180 mg total) by mouth daily., Disp: 10 tablet, Rfl: 1   meclizine (ANTIVERT) 25 MG tablet, Take 1 tablet (25 mg total) by mouth 3 (three) times daily as needed for dizziness., Disp: 30 tablet, Rfl: 0   meloxicam (MOBIC) 15 MG tablet, Take 1 tablet by mouth daily., Disp: , Rfl:    amphetamine-dextroamphetamine (ADDERALL) 20  MG  tablet, Take 1.5 tablets (30 mg total) by mouth daily. Take one tablet AM and half tablet PM, Disp: 45 tablet, Rfl: 0   amphetamine-dextroamphetamine (ADDERALL) 20 MG tablet, Take 1.5 tablets (30 mg total) by mouth daily. Take 1 tablet AM and half tablet PM, Disp: 45 tablet, Rfl: 0   amphetamine-dextroamphetamine (ADDERALL) 20 MG tablet, Take 1.5 tablets (30 mg total) by mouth daily. Take one tablet AM and half tablet PM., Disp: 45 tablet, Rfl: 0   cyclobenzaprine (FLEXERIL) 10 MG tablet, Take 1 tablet by mouth 3 (three) times daily. (Patient not taking: No sig reported), Disp: , Rfl:    fluticasone (FLONASE) 50 MCG/ACT nasal spray, Place 2 sprays into both nostrils daily. (Patient not taking: No sig reported), Disp: 16 g, Rfl: 6   hydrOXYzine (ATARAX/VISTARIL) 25 MG tablet, TAKE 1 TABLET (25 MG TOTAL) BY MOUTH DAILY AS NEEDED. FOR SEVERE ANXIETY ATTACKS ONLY (Patient not taking: No sig reported), Disp: 90 tablet, Rfl: 1   molnupiravir EUA 200 mg CAPS, Take 4 capsules (800 mg total) by mouth 2 (two) times daily for 5 days. (Patient not taking: Reported on 04/12/2021), Disp: 40 capsule, Rfl: 0   traMADol (ULTRAM) 50 MG tablet, Take 50 mg by mouth every 6 (six) hours. (Patient not taking: No sig reported), Disp: , Rfl:    valACYclovir (VALTREX) 500 MG tablet, Take 1 tablet by mouth 2 (two) times daily. (Patient not taking: No sig reported), Disp: , Rfl:     Assessment & Plan:  Dizziness / ? Sec to AOM continues to have green productive phlegn, had covid x 1 month ago  Will start pt on augmentin [place] - tyelnol every 4-6 hrs prn and alternate this with ibubrufen 800 mg q 8 hrly. Sinus pressure: use steam inhalation.  Use   Allegra    Problem List Items Addressed This Visit   None   No orders of the defined types were placed in this encounter.    Meds ordered this encounter  Medications   meclizine (ANTIVERT) 25 MG tablet    Sig: Take 1 tablet (25 mg total) by mouth 3 (three) times daily  as needed for dizziness.    Dispense:  30 tablet    Refill:  0   amoxicillin-clavulanate (AUGMENTIN) 875-125 MG tablet    Sig: Take 1 tablet by mouth 2 (two) times daily.    Dispense:  20 tablet    Refill:  0   fexofenadine (ALLEGRA ALLERGY) 180 MG tablet    Sig: Take 1 tablet (180 mg total) by mouth daily.    Dispense:  10 tablet    Refill:  1     Follow up plan: Return in about 4 weeks (around 05/10/2021).

## 2021-04-12 NOTE — Telephone Encounter (Signed)
Pt was called and scheduled !

## 2021-04-12 NOTE — Telephone Encounter (Signed)
Pt reports "Spinning" dizziness. Onset Thursday evening. States positional, with turning head, going from sitting to lying down, leaning back. States episodes last about 10 seconds, but overall "Woozy , floaty feeling." Denies headache, no weakness or numbness. No new meds or dosage changes.Reports nausea at times with episodes. States seen at practice before for vertigo. States resolved on own without intervention. NT called practice , will route for review. Assured pt NT would route to practice for review and final disposition. Care advise given.  Pt verbalizes understanding.   CB# [phone]

## 2021-04-13 ENCOUNTER — Ambulatory Visit: Payer: 59 | Admitting: Psychiatry

## 2021-04-21 ENCOUNTER — Encounter: Payer: Self-pay | Admitting: Psychiatry

## 2021-04-21 ENCOUNTER — Telehealth (INDEPENDENT_AMBULATORY_CARE_PROVIDER_SITE_OTHER): Payer: 59 | Admitting: Psychiatry

## 2021-04-21 ENCOUNTER — Other Ambulatory Visit: Payer: Self-pay

## 2021-04-21 DIAGNOSIS — F411 Generalized anxiety disorder: Secondary | ICD-10-CM | POA: Diagnosis not present

## 2021-04-21 DIAGNOSIS — F3342 Major depressive disorder, recurrent, in full remission: Secondary | ICD-10-CM | POA: Diagnosis not present

## 2021-04-21 DIAGNOSIS — F900 Attention-deficit hyperactivity disorder, predominantly inattentive type: Secondary | ICD-10-CM

## 2021-04-21 MED ORDER — AMPHETAMINE-DEXTROAMPHETAMINE 20 MG PO TABS: 30.0000 mg | ORAL_TABLET | Freq: Every day | ORAL | 0 refills | Status: DC

## 2021-04-21 MED ORDER — ESCITALOPRAM OXALATE 20 MG PO TABS: 20.0000 mg | ORAL_TABLET | Freq: Every day | ORAL | 0 refills | Status: DC

## 2021-04-21 NOTE — Progress Notes (Signed)
Virtual Visit via Video Note  I connected with Kimberly Knapp on 04/21/21 at  9:00 AM EDT by a video enabled telemedicine application and verified that I am speaking with the correct person using two identifiers.  Location Provider Location : [facility] Patient Location : Home  Participants: Patient , Provider    I discussed the limitations of evaluation and management by telemedicine and the availability of in person appointments. The patient expressed understanding and agreed to proceed.   I discussed the assessment and treatment plan with the patient. The patient was provided an opportunity to ask questions and all were answered. The patient agreed with the plan and demonstrated an understanding of the instructions.   The patient was advised to call back or seek an in-person evaluation if the symptoms worsen or if the condition fails to improve as anticipated.                                                            BH MD OP Progress Note  04/22/2021 6:23 PM ALEESA SWEIGERT  MRN:  [id number]  Chief Complaint:  Chief Complaint   Follow-up; ADHD; Anxiety    HPI: Kimberly Knapp is a 35 year old female, married, lives in [place], currently unemployed, has a history of MDD, GAD, ADHD was evaluated by telemedicine today.  Patient today reports she and her family had COVID-19 infection a month ago.  She also was treated for otitis media as well as vertigo recently-reviewed notes per family physician-Dr.Vigg-patient completed a course of Augmentin as well as was prescribed a course of meclizine.  She currently denies any vertigo or any other symptoms.  Patient reports overall mood symptoms are stable.  Denies any significant anxiety or depression.  She is compliant on the Lexapro.  Denies side effects.  She reports sleep and appetite is fair.  She is planning to start Keto diet again and maybe joining an exercise program.  She is interested in weight loss.  Patient is compliant on  the Adderall as prescribed.  Reports her concentration and attention is good.  Denies side effects.  Patient denies any other concerns today.  Visit Diagnosis:    ICD-10-CM   1. MDD (major depressive disorder), recurrent, in full remission (HCC)  F33.42 escitalopram (LEXAPRO) 20 MG tablet    2. GAD (generalized anxiety disorder)  F41.1 escitalopram (LEXAPRO) 20 MG tablet    3. Attention deficit hyperactivity disorder (ADHD), predominantly inattentive type  F90.0 amphetamine-dextroamphetamine (ADDERALL) 20 MG tablet    amphetamine-dextroamphetamine (ADDERALL) 20 MG tablet    amphetamine-dextroamphetamine (ADDERALL) 20 MG tablet      Past Psychiatric History: Reviewed past psychiatric history from progress note on 10/13/2017.  Past trials of Zoloft, Prozac, Celexa, Wellbutrin, Adderall  Past Medical History:  Past Medical History:  Diagnosis Date   ADHD (attention deficit hyperactivity disorder)    Depression    Goiter    Herpes genitalis    Obesity (BMI 30.0-34.9)    Pregnancy induced hypertension     Past Surgical History:  Procedure Laterality Date   BREAST ENHANCEMENT SURGERY Bilateral    CESAREAN SECTION N/A 05/29/2015   Procedure: CESAREAN SECTION;  Surgeon: [name], MD;  Location: [facility];  Service: Obstetrics;  Laterality: N/A;   CESAREAN SECTION N/A 06/25/2018   Procedure:  CESAREAN SECTION, repeat;  Surgeon: [name], Elenora Fenderhelsea C, MD;  Location: [facility];  Service: Obstetrics;  Laterality: N/A;   DILATION AND CURETTAGE OF UTERUS     THYROID SURGERY     thyroid biopsy   WISDOM TOOTH EXTRACTION      Family Psychiatric History: Reviewed family psychiatric history from progress note on 10/13/2017  Family History:  Family History  Problem Relation Age of Onset   Diabetes type II Other    Lung cancer Other    Heart disease Other    Lung cancer Other    ADD / ADHD Father    Alcohol abuse Maternal Grandfather     Social History: Reviewed social history from progress  note on 10/13/2017 Social History   Socioeconomic History   Marital status: Married    Spouse name: michael    Number of children: 3   Years of education: Not on file   Highest education level: Associate degree: occupational, Scientist, product/process developmenttechnical, or vocational program  Occupational History   Occupation: [employer]    Comment: not employed  Tobacco Use   Smoking status: Former    Types: Cigarettes    Quit date: 10/13/2017    Years since quitting: 3.5   Smokeless tobacco: Never  Vaping Use   Vaping Use: Never used  Substance and Sexual Activity   Alcohol use: Yes    Alcohol/week: 1.0 standard drink    Types: 1 Glasses of wine per week    Comment: occasional    Drug use: No   Sexual activity: Yes    Partners: Male    Birth control/protection: None, I.U.D.  Other Topics Concern   Not on file  Social History Narrative   Not on file   Social Determinants of Health   Financial Resource Strain: Not on file  Food Insecurity: Not on file  Transportation Needs: Not on file  Physical Activity: Not on file  Stress: Not on file  Social Connections: Not on file    Allergies:  Allergies  Allergen Reactions   Iodine Swelling   Shrimp [Shellfish Allergy] Swelling    Metabolic Disorder Labs: No results found for: HGBA1C, MPG No results found for: PROLACTIN No results found for: CHOL, TRIG, HDL, CHOLHDL, VLDL, LDLCALC Lab Results  Component Value Date   TSH 0.676 08/20/2020   TSH 0.423 (L) 09/26/2017    Therapeutic Level Labs: No results found for: LITHIUM No results found for: VALPROATE No components found for:  CBMZ  Current Medications: Current Outpatient Medications  Medication Sig Dispense Refill   amoxicillin-clavulanate (AUGMENTIN) 875-125 MG tablet Take 1 tablet by mouth 2 (two) times daily. 20 tablet 0   amphetamine-dextroamphetamine (ADDERALL) 20 MG tablet TAKE 1.5 TABLETS (30 MG TOTAL) BY MOUTH DAILY. TAKE ONE TABLET AM AND HALF TABLET PM.     [START ON 05/12/2021]  amphetamine-dextroamphetamine (ADDERALL) 20 MG tablet Take 1.5 tablets (30 mg total) by mouth daily. Take one tablet AM and half tablet PM 45 tablet 0   [START ON 06/10/2021] amphetamine-dextroamphetamine (ADDERALL) 20 MG tablet Take 1.5 tablets (30 mg total) by mouth daily. Take 1 tablet AM and half tablet PM 45 tablet 0   [START ON 07/08/2021] amphetamine-dextroamphetamine (ADDERALL) 20 MG tablet Take 1.5 tablets (30 mg total) by mouth daily. Take one tablet AM and half tablet PM. 45 tablet 0   cyclobenzaprine (FLEXERIL) 10 MG tablet Take 1 tablet by mouth 3 (three) times daily. (Patient not taking: No sig reported)     escitalopram (LEXAPRO) 20 MG  tablet Take 1 tablet (20 mg total) by mouth daily. 90 tablet 0   fexofenadine (ALLEGRA ALLERGY) 180 MG tablet Take 1 tablet (180 mg total) by mouth daily. 10 tablet 1   fluticasone (FLONASE) 50 MCG/ACT nasal spray Place 2 sprays into both nostrils daily. (Patient not taking: No sig reported) 16 g 6   hydrOXYzine (ATARAX/VISTARIL) 25 MG tablet TAKE 1 TABLET (25 MG TOTAL) BY MOUTH DAILY AS NEEDED. FOR SEVERE ANXIETY ATTACKS ONLY (Patient not taking: No sig reported) 90 tablet 1   meclizine (ANTIVERT) 25 MG tablet Take 1 tablet (25 mg total) by mouth 3 (three) times daily as needed for dizziness. 30 tablet 0   meloxicam (MOBIC) 15 MG tablet Take 1 tablet by mouth daily.     molnupiravir EUA 200 mg CAPS Take 4 capsules (800 mg total) by mouth 2 (two) times daily for 5 days. (Patient not taking: Reported on 04/12/2021) 40 capsule 0   traMADol (ULTRAM) 50 MG tablet Take 50 mg by mouth every 6 (six) hours. (Patient not taking: No sig reported)     valACYclovir (VALTREX) 500 MG tablet Take 1 tablet by mouth 2 (two) times daily. (Patient not taking: No sig reported)     No current facility-administered medications for this visit.     Musculoskeletal: Strength & Muscle Tone:  UTA Gait & Station: normal Patient leans: N/A  Psychiatric Specialty Exam: Review of  Systems  Psychiatric/Behavioral:  Negative for agitation, behavioral problems, confusion, decreased concentration, dysphoric mood, hallucinations, self-injury, sleep disturbance and suicidal ideas. The patient is not nervous/anxious and is not hyperactive.   All other systems reviewed and are negative.  There were no vitals taken for this visit.There is no height or weight on file to calculate BMI.  General Appearance: Casual  Eye Contact:  Good  Speech:  Clear and Coherent  Volume:  Normal  Mood:  Euthymic  Affect:  Congruent  Thought Process:  Goal Directed and Descriptions of Associations: Intact  Orientation:  Full (Time, Place, and Person)  Thought Content: Logical   Suicidal Thoughts:  No  Homicidal Thoughts:  No  Memory:  Immediate;   Fair Recent;   Fair Remote;   Fair  Judgement:  Fair  Insight:  Fair  Psychomotor Activity:  Normal  Concentration:  Concentration: Fair and Attention Span: Fair  Recall:  [employer] of Knowledge: Fair  Language: Fair  Akathisia:  No  Handed:  Right  AIMS (if indicated): not done  Assets:  Communication Skills Desire for Improvement Housing Intimacy Resilience Social Support Talents/Skills Transportation Vocational/Educational  ADL's:  Intact  Cognition: WNL  Sleep:  Fair   Screenings: GAD-7    Flowsheet Row Video Visit from [facility]21/2021 in [facility] Video Visit from 02/17/2021 in [facility]  Total GAD-7 Score 1 0      PHQ2-9    Flowsheet Row Video Visit from 04/21/2021 in [facility] Office Visit from 04/12/2021 in [facility] Video Visit from 02/17/2021 in [facility] Video Visit from 11/25/2020 in [facility] Office Visit from 08/20/2020 in [facility]  PHQ-2 Total Score 0 0 0 0 0  PHQ-9 Total Score 1 6 -- -- 6      Flowsheet Row Video Visit from 11/25/2020 in  [facility]  C-SSRS RISK CATEGORY No Risk        Assessment and Plan: JEIRY BIRNBAUM is a 35 year old Caucasian female who has  a history of MDD, GAD, ADD was evaluated by telemedicine today.  Patient is currently stable.  Plan as noted below.  Plan MDD in remission Lexapro 20 mg p.o. daily Trazodone 25-50 mg p.o. nightly as needed  GAD-stable Lexapro 20 mg p.o. daily  ADHD-stable Adderall 30 mg p.o. daily in divided dosage I have provided 3 prescriptions with date specified, last 1 to be filled on or after 07/08/2021 Reviewed in [facility]. I have reviewed blood pressure and heart rate reading from family physician progress note-Dr.Vigg , dated 04/12/2021-BP-120/70, heart rate-91.  Discussed with patient the need to continue to monitor her blood pressure and heart rate while she is on stimulant medication.  Patient advised to come into the office for in person visit in 3 months.  This note was generated in part or whole with voice recognition software. Voice recognition is usually quite accurate but there are transcription errors that can and very often do occur. I apologize for any typographical errors that were not detected and corrected.      Jomarie Longs, MD 04/22/2021, 6:23 PM

## 2021-06-09 ENCOUNTER — Other Ambulatory Visit: Payer: Self-pay

## 2021-06-09 ENCOUNTER — Ambulatory Visit (INDEPENDENT_AMBULATORY_CARE_PROVIDER_SITE_OTHER): Payer: 59 | Admitting: Internal Medicine

## 2021-06-09 ENCOUNTER — Encounter: Payer: Self-pay | Admitting: Internal Medicine

## 2021-06-09 VITALS — BP 113/81 | HR 81 | Temp 98.4°F | Ht 67.4 in | Wt 229.6 lb

## 2021-06-09 DIAGNOSIS — Z0000 Encounter for general adult medical examination without abnormal findings: Secondary | ICD-10-CM | POA: Insufficient documentation

## 2021-06-09 DIAGNOSIS — K644 Residual hemorrhoidal skin tags: Secondary | ICD-10-CM | POA: Insufficient documentation

## 2021-06-09 DIAGNOSIS — K649 Unspecified hemorrhoids: Secondary | ICD-10-CM | POA: Diagnosis not present

## 2021-06-09 DIAGNOSIS — A6000 Herpesviral infection of urogenital system, unspecified: Secondary | ICD-10-CM

## 2021-06-09 MED ORDER — VALACYCLOVIR HCL 500 MG PO TABS: 500.0000 mg | ORAL_TABLET | Freq: Two times a day (BID) | ORAL | 1 refills | Status: AC

## 2021-06-09 NOTE — Progress Notes (Signed)
BP 113/81   Pulse 81   Temp 98.4 F (36.9 C) (Oral)   Ht 5' 7.4" (1.712 m)   Wt 229 lb 9.6 oz (104.1 kg)   SpO2 99%   BMI 35.53 kg/m    Subjective:    Patient ID: Kimberly Knapp, female    DOB: 1986-07-02, 35 y.o.   MRN: [id number]  Chief Complaint  Patient presents with   Hemorrhoids    Becomes inflamed after bowel movements.    Annual Exam   Contraception    Has IUD, has been having some bleeding, has not happened before and has had IUD for years.    HPI: Kimberly Knapp is a 35 y.o. female  Pt is here for a physical , she was seen in august for dizziness and AOM all better now. Has genital spores takes valtrex as needed for such says everytime she shaves she gets sores/ yeast infection- husband has such too.  Of note pt has PAC's no more palpitations then, she cut back caffeine intake and this seems to help.  Rectal Bleeding  The current episode started more than 1 week ago (has had hemmorhroids, says she used prep h and says she has an odour when she wakes up form her hemmorhodis.). The problem occurs occasionally. The problem has been unchanged. Associated symptoms include hemorrhoids. Pertinent negatives include no anorexia, no fever, no abdominal pain, no diarrhea, no nausea, no rectal pain, no vomiting, no hematuria, no vaginal bleeding, no vaginal discharge, no chest pain, no headaches, no coughing, no difficulty breathing and no rash.   Chief Complaint  Patient presents with   Hemorrhoids    Becomes inflamed after bowel movements.    Annual Exam   Contraception    Has IUD, has been having some bleeding, has not happened before and has had IUD for years.    Relevant past medical, surgical, family and social history reviewed and updated as indicated. Interim medical history since our last visit reviewed. Allergies and medications reviewed and updated.  Review of Systems  Constitutional:  Negative for activity change, appetite change, chills, fatigue and  fever.  HENT:  Negative for congestion, ear discharge, ear pain and facial swelling.   Eyes:  Negative for pain and itching.  Respiratory:  Negative for cough, chest tightness, shortness of breath and wheezing.   Cardiovascular:  Negative for chest pain, palpitations and leg swelling.  Gastrointestinal:  Positive for hematochezia and hemorrhoids. Negative for abdominal distention, abdominal pain, anorexia, blood in stool, constipation, diarrhea, nausea, rectal pain and vomiting.  Endocrine: Negative for cold intolerance, heat intolerance, polydipsia, polyphagia and polyuria.  Genitourinary:  Negative for difficulty urinating, dysuria, flank pain, frequency, hematuria, urgency, vaginal bleeding and vaginal discharge.  Musculoskeletal:  Negative for arthralgias, gait problem, joint swelling and myalgias.  Skin:  Negative for color change, rash and wound.  Neurological:  Negative for dizziness, tremors, speech difficulty, weakness, light-headedness, numbness and headaches.  Hematological:  Does not bruise/bleed easily.  Psychiatric/Behavioral:  Negative for agitation, confusion, decreased concentration, sleep disturbance and suicidal ideas.    Per HPI unless specifically indicated above     Objective:    BP 113/81   Pulse 81   Temp 98.4 F (36.9 C) (Oral)   Ht 5' 7.4" (1.712 m)   Wt 229 lb 9.6 oz (104.1 kg)   SpO2 99%   BMI 35.53 kg/m   Wt Readings from Last 3 Encounters:  06/09/21 229 lb 9.6 oz (104.1 kg)  04/12/21 227  lb 6.4 oz (103.1 kg)  08/20/20 230 lb 12.8 oz (104.7 kg)    Physical Exam Vitals and nursing note reviewed.  Constitutional:      General: She is not in acute distress.    Appearance: Normal appearance. She is not ill-appearing or diaphoretic.  HENT:     Head: Normocephalic and atraumatic.     Right Ear: Tympanic membrane and external ear normal. There is no impacted cerumen.     Left Ear: External ear normal.     Nose: No congestion or rhinorrhea.      Mouth/Throat:     Pharynx: No oropharyngeal exudate or posterior oropharyngeal erythema.  Eyes:     Conjunctiva/sclera: Conjunctivae normal.     Pupils: Pupils are equal, round, and reactive to light.  Cardiovascular:     Rate and Rhythm: Normal rate and regular rhythm.     Heart sounds: No murmur heard.   No friction rub. No gallop.  Pulmonary:     Effort: No respiratory distress.     Breath sounds: No stridor. No wheezing or rhonchi.  Chest:     Chest wall: No tenderness.  Abdominal:     General: Abdomen is flat. Bowel sounds are normal. There is no distension.     Palpations: Abdomen is soft. There is no mass.     Tenderness: There is no abdominal tenderness. There is no guarding.  Musculoskeletal:        General: No swelling or deformity.     Cervical back: Normal range of motion and neck supple. No rigidity or tenderness.     Right lower leg: No edema.     Left lower leg: No edema.  Skin:    General: Skin is warm and dry.     Coloration: Skin is not jaundiced.     Findings: No erythema.  Neurological:     Mental Status: She is alert and oriented to person, place, and time. Mental status is at baseline.  Psychiatric:        Mood and Affect: Mood normal.        Behavior: Behavior normal.        Thought Content: Thought content normal.        Judgment: Judgment normal.   Results for orders placed or performed in visit on 08/20/20  CBC with Differential/Platelet  Result Value Ref Range   WBC 7.2 3.4 - 10.8 x10E3/uL   RBC 4.65 3.77 - 5.28 x10E6/uL   Hemoglobin 14.9 11.1 - 15.9 g/dL   Hematocrit 40.0 86.7 - 46.6 %   MCV 94 79 - 97 fL   MCH 32.0 26.6 - 33.0 pg   MCHC 34.3 31.5 - 35.7 g/dL   RDW 61.9 50.9 - 32.6 %   Platelets 344 150 - 450 x10E3/uL   Neutrophils 49 Not Estab. %   Lymphs 42 Not Estab. %   Monocytes 6 Not Estab. %   Eos 2 Not Estab. %   Basos 1 Not Estab. %   Neutrophils Absolute 3.6 1.4 - 7.0 x10E3/uL   Lymphocytes Absolute 3.0 0.7 - 3.1 x10E3/uL    Monocytes Absolute 0.4 0.1 - 0.9 x10E3/uL   EOS (ABSOLUTE) 0.2 0.0 - 0.4 x10E3/uL   Basophils Absolute 0.0 0.0 - 0.2 x10E3/uL   Immature Granulocytes 0 Not Estab. %   Immature Grans (Abs) 0.0 0.0 - 0.1 x10E3/uL  Comprehensive metabolic panel  Result Value Ref Range   Glucose 92 65 - 99 mg/dL   BUN 11 6 -  20 mg/dL   Creatinine, Ser 1.32 0.57 - 1.00 mg/dL   GFR calc non Af Amer 92 >59 mL/min/1.73   GFR calc Af Amer 106 >59 mL/min/1.73   BUN/Creatinine Ratio 13 9 - 23   Sodium 139 134 - 144 mmol/L   Potassium 4.0 3.5 - 5.2 mmol/L   Chloride 105 96 - 106 mmol/L   CO2 19 (L) 20 - 29 mmol/L   Calcium 9.5 8.7 - 10.2 mg/dL   Total Protein 6.9 6.0 - 8.5 g/dL   Albumin 4.8 3.8 - 4.8 g/dL   Globulin, Total 2.1 1.5 - 4.5 g/dL   Albumin/Globulin Ratio 2.3 (H) 1.2 - 2.2   Bilirubin Total 0.3 0.0 - 1.2 mg/dL   Alkaline Phosphatase 73 44 - 121 IU/L   AST 17 0 - 40 IU/L   ALT 14 0 - 32 IU/L  TSH  Result Value Ref Range   TSH 0.676 0.450 - 4.500 uIU/mL  PTH, intact (no Ca)  Result Value Ref Range   PTH 31 15 - 65 pg/mL        Current Outpatient Medications:    amphetamine-dextroamphetamine (ADDERALL) 20 MG tablet, TAKE 1.5 TABLETS (30 MG TOTAL) BY MOUTH DAILY. TAKE ONE TABLET AM AND HALF TABLET PM., Disp: , Rfl:    amphetamine-dextroamphetamine (ADDERALL) 20 MG tablet, Take 1.5 tablets (30 mg total) by mouth daily. Take one tablet AM and half tablet PM, Disp: 45 tablet, Rfl: 0   [START ON 06/10/2021] amphetamine-dextroamphetamine (ADDERALL) 20 MG tablet, Take 1.5 tablets (30 mg total) by mouth daily. Take 1 tablet AM and half tablet PM, Disp: 45 tablet, Rfl: 0   [START ON 07/08/2021] amphetamine-dextroamphetamine (ADDERALL) 20 MG tablet, Take 1.5 tablets (30 mg total) by mouth daily. Take one tablet AM and half tablet PM., Disp: 45 tablet, Rfl: 0   escitalopram (LEXAPRO) 20 MG tablet, Take 1 tablet (20 mg total) by mouth daily., Disp: 90 tablet, Rfl: 0   valACYclovir (VALTREX) 500 MG tablet,  Take 1 tablet by mouth 2 (two) times daily., Disp: , Rfl:     Assessment & Plan:  ADDis on adderall for such  Would like [place] to refill this  Will see psych once a year. Sees them currently every 3 months, says is expensive.   Anxety/ depresson :  Is on lexapro for as well Sees psych for such   Genital herpes will need to fu with obgy for such   Hemmorrhoids  Using preparation H  Will refr to gastroenterology for further mx   Problem List Items Addressed This Visit   None    No orders of the defined types were placed in this encounter.    No orders of the defined types were placed in this encounter.    Follow up plan: No follow-ups on file.  Health Maintenance :  Paps smear: to scheudle this with ob gyn   Check labs tommorow.

## 2021-07-23 ENCOUNTER — Ambulatory Visit: Payer: 59 | Admitting: Gastroenterology

## 2021-07-27 ENCOUNTER — Other Ambulatory Visit: Payer: Self-pay

## 2021-07-27 ENCOUNTER — Ambulatory Visit (INDEPENDENT_AMBULATORY_CARE_PROVIDER_SITE_OTHER): Payer: 59 | Admitting: Psychiatry

## 2021-07-27 ENCOUNTER — Encounter: Payer: Self-pay | Admitting: Psychiatry

## 2021-07-27 VITALS — BP 116/82 | HR 97 | Temp 98.1°F | Wt 229.6 lb

## 2021-07-27 DIAGNOSIS — F411 Generalized anxiety disorder: Secondary | ICD-10-CM

## 2021-07-27 DIAGNOSIS — R4789 Other speech disturbances: Secondary | ICD-10-CM | POA: Insufficient documentation

## 2021-07-27 DIAGNOSIS — F900 Attention-deficit hyperactivity disorder, predominantly inattentive type: Secondary | ICD-10-CM

## 2021-07-27 DIAGNOSIS — F3342 Major depressive disorder, recurrent, in full remission: Secondary | ICD-10-CM | POA: Diagnosis not present

## 2021-07-27 MED ORDER — AMPHETAMINE-DEXTROAMPHETAMINE 20 MG PO TABS: 30.0000 mg | ORAL_TABLET | Freq: Every day | ORAL | 0 refills | Status: DC

## 2021-07-27 NOTE — Progress Notes (Signed)
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  Patient today reports she is currently doing well with regards to her mood symptoms.  Denies any significant depression or anxiety.  Patient reports she missed her Lexapro dosage for a few days couple of months ago.  She did not feel any difference without it and hence decided to stop taking it.  She reports she is currently not taking it and her mood symptoms are stable.  She also reports her sex drive has improved since being off of the Lexapro.  Patient reports she is able to sleep through the night and does not need the trazodone, for sleep anymore.  She hence is not taking it.  She continues to take the Adderall as prescribed.  She understands she needs to be more consistent with that however when she is able to take it she is doing fairly well.  She denies any side effects.  Patient reports she has noticed that she has been mixing up words, using the wrong words in conversations, then suddenly realizes and tries to correct herself.  Her family has also observed this.  Usually she goes through episodes of doing it for a few days couple of times a month, going on since the past 4 to 6 months.  She denies any significant memory problems, denies any neurological deficits otherwise.  Does report a history of dizziness and vertigo, Had it  twice recently resolved with antibiotic for ear infection.  Currently denies it.  Patient denies any suicidality, homicidality or perceptual disturbances.  Patient denies any other concerns today. Visit Diagnosis:    ICD-10-CM   1. MDD (major depressive disorder), recurrent, in full remission (HCC)  F33.42     2. GAD (generalized anxiety disorder)  F41.1      3. Attention deficit hyperactivity disorder (ADHD), predominantly inattentive type  F90.0 amphetamine-dextroamphetamine (ADDERALL) 20 MG tablet    amphetamine-dextroamphetamine (ADDERALL) 20 MG tablet    amphetamine-dextroamphetamine (ADDERALL) 20 MG tablet    4. Word finding difficulty  R47.89       Past Psychiatric History: Reviewed from past psychiatric history from progress note on 10/13/2017.  Past trials of Zoloft, Prozac, Celexa, Wellbutrin, Adderall  Past Medical History:  Past Medical History:  Diagnosis Date   ADHD (attention deficit hyperactivity disorder)    Depression    Goiter    Herpes genitalis    Obesity (BMI 30.0-34.9)    Pregnancy induced hypertension     Past Surgical History:  Procedure Laterality Date   BREAST ENHANCEMENT SURGERY Bilateral    CESAREAN SECTION N/A 05/29/2015   Procedure: CESAREAN SECTION;  Surgeon: [name], MD;  Location: [facility]S;  Service: Obstetrics;  Laterality: N/A;   CESAREAN SECTION N/A 06/25/2018   Procedure: CESAREAN SECTION, repeat;  Surgeon: [name], Elenora Fender, MD;  Location: [facility];  Service: Obstetrics;  Laterality: N/A;   DILATION AND CURETTAGE OF UTERUS     THYROID SURGERY     thyroid biopsy   WISDOM TOOTH EXTRACTION      Family Psychiatric History: Reviewed family psychiatric history from progress note on 10/13/2017  Family History:  Family History  Problem Relation Age of Onset   Diabetes type II Other  Lung cancer Other    Heart disease Other    Lung cancer Other    ADD / ADHD Father    Alcohol abuse Maternal Grandfather     Social History: Reviewed social history from progress note on 10/13/2017 Social History   Socioeconomic History   Marital status: Married    Spouse name: michael    Number of children: 3   Years of education: Not on file   Highest education level: Associate degree: occupational, Scientist, product/process development, or vocational program  Occupational History   Occupation: [employer]    Comment: not  employed  Tobacco Use   Smoking status: Former    Types: Cigarettes    Quit date: 10/13/2017    Years since quitting: 3.7   Smokeless tobacco: Never  Vaping Use   Vaping Use: Never used  Substance and Sexual Activity   Alcohol use: Yes    Alcohol/week: 1.0 standard drink    Types: 1 Glasses of wine per week    Comment: occasional    Drug use: No   Sexual activity: Yes    Partners: Male    Birth control/protection: None, I.U.D.  Other Topics Concern   Not on file  Social History Narrative   Not on file   Social Determinants of Health   Financial Resource Strain: Not on file  Food Insecurity: Not on file  Transportation Needs: Not on file  Physical Activity: Not on file  Stress: Not on file  Social Connections: Not on file    Allergies:  Allergies  Allergen Reactions   Iodine Swelling   Shrimp [Shellfish Allergy] Swelling    Metabolic Disorder Labs: No results found for: HGBA1C, MPG No results found for: PROLACTIN No results found for: CHOL, TRIG, HDL, CHOLHDL, VLDL, LDLCALC Lab Results  Component Value Date   TSH 0.676 08/20/2020   TSH 0.423 (L) 09/26/2017    Therapeutic Level Labs: No results found for: LITHIUM No results found for: VALPROATE No components found for:  CBMZ  Current Medications: Current Outpatient Medications  Medication Sig Dispense Refill   [START ON 09/03/2021] amphetamine-dextroamphetamine (ADDERALL) 20 MG tablet Take 1.5 tablets (30 mg total) by mouth daily. Take 1 tablet daily AM and half tablet daily after lunch 45 tablet 0   [START ON 10/02/2021] amphetamine-dextroamphetamine (ADDERALL) 20 MG tablet Take 1.5 tablets (30 mg total) by mouth daily. Take 1 tablet daily AM and half tablet daily after lunch 45 tablet 0   valACYclovir (VALTREX) 500 MG tablet Take 1 tablet (500 mg total) by mouth 2 (two) times daily. 20 tablet 1   [START ON 08/06/2021] amphetamine-dextroamphetamine (ADDERALL) 20 MG tablet Take 1.5 tablets (30 mg total) by  mouth daily. Take one tablet AM and half tablet PM. 45 tablet 0   No current facility-administered medications for this visit.     Musculoskeletal: Strength & Muscle Tone: within normal limits Gait & Station: normal Patient leans: N/A  Psychiatric Specialty Exam: Review of Systems  Neurological:  Positive for speech difficulty (Reports word-finding difficulty).  Psychiatric/Behavioral:  Negative for agitation, behavioral problems, confusion, decreased concentration, dysphoric mood, hallucinations, self-injury, sleep disturbance and suicidal ideas. The patient is not nervous/anxious and is not hyperactive.   All other systems reviewed and are negative.  Blood pressure 116/82, pulse 97, temperature 98.1 F (36.7 C), temperature source Temporal, weight 229 lb 9.6 oz (104.1 kg).Body mass index is 35.53 kg/m.  General Appearance: Casual  Eye Contact:  Good  Speech:  Clear and Coherent  Volume:  Normal  Mood:  Euthymic  Affect:  Congruent  Thought Process:  Goal Directed and Descriptions of Associations: Intact  Orientation:  Full (Time, Place, and Person)  Thought Content: Logical   Suicidal Thoughts:  No  Homicidal Thoughts:  No  Memory:  Immediate;   Fair Recent;   Fair Remote;   Fair  Judgement:  Fair  Insight:  Fair  Psychomotor Activity:  Normal  Concentration:  Concentration: Fair and Attention Span: Fair  Recall:  [employer] of Knowledge: Fair  Language: Fair  Akathisia:  No  Handed:  Right  AIMS (if indicated): not done  Assets:  Communication Skills Desire for Improvement Housing Intimacy Social Support Talents/Skills Transportation Vocational/Educational  ADL's:  Intact  Cognition: WNL  Sleep:  Fair   Screenings: GAD-7    Flowsheet Row Office Visit from 07/27/2021 in [facility] Video Visit from 04/21/2021 in [facility] Video Visit from 02/17/2021 in [facility]  Total  GAD-7 Score 0 1 0      PHQ2-9    Flowsheet Row Office Visit from 07/27/2021 in [facility] Office Visit from 06/09/2021 in [facility] Video Visit from 04/21/2021 in [facility] Office Visit from 04/12/2021 in [facility] Visit from 02/17/2021 in [facility]  PHQ-2 Total Score 0 0 0 0 0  PHQ-9 Total Score -- 0 1 6 --      Flowsheet Row Video Visit from 11/25/2020 in [facility]  C-SSRS RISK CATEGORY No Risk        Assessment and Plan: Kimberly Knapp is a 36 year old Caucasian female who has a history of MDD, GAD, ADD was evaluated in office today.  Patient currently noncompliant with Lexapro, trazodone, reports mood symptoms are stable.  Patient continues to need a stimulant for her ADHD symptoms, currently would like to stay on the current dosage.  Patient with word finding difficulty as noted above, is interested in being referred to a neurologist for a neurological consultation.  Plan as noted below.  Plan MDD in remission Discontinue Lexapro and trazodone due to noncompliance. We will monitor closely PHQ 2-0  GAD-stable GAD 7-0   ADHD-stable Adderall 30 mg p.o. daily in divided dosage, provided 3 prescriptions with date specified, last one to be filled on or after 10/02/2021. Reviewed [place] PMP aware  Word finding difficulty-unstable Likely multifactorial including her ADHD, being on an antidepressant like Lexapro. Patient however is interested in a referral to neurology for neurological consultation, will refer her for the same. Discussed starting CBT for her ADHD symptoms.  Follow-up in clinic in 3 months or sooner in person.  This note was generated in part or whole with voice recognition software. Voice recognition is usually quite accurate but there are transcription errors that can and very often do occur. I apologize for any  typographical errors that were not detected and corrected.        Jomarie Longs, MD 07/27/2021, 11:17 AM

## 2021-09-08 ENCOUNTER — Ambulatory Visit: Payer: Self-pay | Admitting: Internal Medicine

## 2021-09-28 ENCOUNTER — Telehealth: Payer: Self-pay

## 2021-09-28 NOTE — Telephone Encounter (Signed)
Will not refill early.  Patient was already provided a prescription for October 02 2021-a day early for her convenience to go to the pharmacy to pick it up.  Will not provide a prescription earlier than that.

## 2021-09-28 NOTE — Telephone Encounter (Signed)
Medication management - Telephone call with pt, after she left a message her husband had thrown away a bag she had left in her car that also had her Adderall in it.  Pt. reported something like this has never happened with her and questioned if anything could be done as she is not due for a refill until 10/04/21.  Verified with pt's [facility] she last filled the medication on 09/04/22 so it was too early to refill.  Informed pt the request would be sent to Dr. Elna Breslow but discussed this was a controlled medication so this may not be allowed to be filled early and also her insurance may not pay for it early as well.  Patient stated if something could be done great, but if not she understood.  Patient did share insurance should not be an issue as she typically uses coupon cards to help fill and cover the cost of medications.

## 2021-09-28 NOTE — Telephone Encounter (Signed)
Medication management - Generic telephone message left for pt that her medication request to be filled early would not be approved due to provider had allowed it to be filled early the previous month.  Requested a call back if any questions.

## 2021-11-03 ENCOUNTER — Telehealth: Payer: Self-pay

## 2021-11-03 DIAGNOSIS — F900 Attention-deficit hyperactivity disorder, predominantly inattentive type: Secondary | ICD-10-CM

## 2021-11-03 MED ORDER — AMPHETAMINE-DEXTROAMPHETAMINE 20 MG PO TABS: 30.0000 mg | ORAL_TABLET | Freq: Every day | ORAL | 0 refills | Status: DC

## 2021-11-03 NOTE — Telephone Encounter (Signed)
pt needs refills on the adderall 

## 2021-11-03 NOTE — Telephone Encounter (Signed)
I have sent Adderall to pharmacy. ?

## 2021-11-25 ENCOUNTER — Other Ambulatory Visit: Payer: Self-pay

## 2021-11-25 ENCOUNTER — Telehealth (INDEPENDENT_AMBULATORY_CARE_PROVIDER_SITE_OTHER): Payer: No Typology Code available for payment source | Admitting: Psychiatry

## 2021-11-25 ENCOUNTER — Encounter: Payer: Self-pay | Admitting: Psychiatry

## 2021-11-25 DIAGNOSIS — F411 Generalized anxiety disorder: Secondary | ICD-10-CM | POA: Diagnosis not present

## 2021-11-25 DIAGNOSIS — F3342 Major depressive disorder, recurrent, in full remission: Secondary | ICD-10-CM | POA: Diagnosis not present

## 2021-11-25 DIAGNOSIS — F900 Attention-deficit hyperactivity disorder, predominantly inattentive type: Secondary | ICD-10-CM | POA: Diagnosis not present

## 2021-11-25 MED ORDER — AMPHETAMINE-DEXTROAMPHETAMINE 20 MG PO TABS: 30.0000 mg | ORAL_TABLET | Freq: Every day | ORAL | 0 refills | Status: DC

## 2021-11-25 MED ORDER — AMPHETAMINE-DEXTROAMPHETAMINE 20 MG PO TABS
30.0000 mg | ORAL_TABLET | Freq: Every day | ORAL | 0 refills | Status: DC
Start: 2021-12-02 — End: 2021-12-07

## 2021-11-25 NOTE — Progress Notes (Signed)
Virtual Visit via Video Note ? ?I connected with Kimberly Knapp on 11/25/21 at  8:30 AM EDT by a video enabled telemedicine application and verified that I am speaking with the correct person using two identifiers. ? ?Location ?Provider Location : [facility] ?Patient Location : Home ? ?Participants: Patient , Provider ?  ?I discussed the limitations of evaluation and management by telemedicine and the availability of in person appointments. The patient expressed understanding and agreed to proceed. ? ?  ?I discussed the assessment and treatment plan with the patient. The patient was provided an opportunity to ask questions and all were answered. The patient agreed with the plan and demonstrated an understanding of the instructions. ?  ?The patient was advised to call back or seek an in-person evaluation if the symptoms worsen or if the condition fails to improve as anticipated. ? ?BH MD OP Progress Note ? ?11/25/2021 8:53 AM ?Kimberly Knapp  ?MRN:  [id number] ? ?Chief Complaint:  ?Chief Complaint  ?Patient presents with  ? Follow-up: 36 year old female, married, has a history of MDD, GAD, ADHD, presented for medication management.  ? 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 ? ?Patient today reports she is currently coping with her anxiety better than before.  Denies any sadness, anhedonia. ? ?Reports sleep and appetite as fair.  She is interested in losing weight and is planning to start watching her diet as well as start exercising. ? ?Patient reports that attention and focus is good on the Adderall.  Denies side effects. ? ?Denies suicidality, homicidality or perceptual disturbances. ? ?Patient denies any other concerns today. ? ?Visit Diagnosis:  ?  ICD-10-CM   ?1. MDD (major depressive disorder), recurrent, in full remission (HCC)  F33.42   ?  ?2. GAD (generalized anxiety disorder)   F41.1   ?  ?3. Attention deficit hyperactivity disorder (ADHD), predominantly inattentive type  F90.0 amphetamine-dextroamphetamine (ADDERALL) 20 MG tablet  ?  amphetamine-dextroamphetamine (ADDERALL) 20 MG tablet  ?  amphetamine-dextroamphetamine (ADDERALL) 20 MG tablet  ?  ? ? ?Past Psychiatric History: Reviewed past psychiatric history from progress note on 10/13/2017.  Past trials of Zoloft, Prozac, Celexa, Wellbutrin, Adderall. ? ?Past Medical History:  ?Past Medical History:  ?Diagnosis Date  ? ADHD (attention deficit hyperactivity disorder)   ? Depression   ? Goiter   ? Herpes genitalis   ? Obesity (BMI 30.0-34.9)   ? Pregnancy induced hypertension   ?  ?Past Surgical History:  ?Procedure Laterality Date  ? BREAST ENHANCEMENT SURGERY Bilateral   ? CESAREAN SECTION N/A 05/29/2015  ? Procedure: CESAREAN SECTION;  Surgeon: Elenora Fenderhelsea C [name], MD;  Location: [facility];  Service: Obstetrics;  Laterality: N/A;  ? CESAREAN SECTION N/A 06/25/2018  ? Procedure: CESAREAN SECTION, repeat;  Surgeon: [name], Elenora Fenderhelsea C, MD;  Location: [facility];  Service: Obstetrics;  Laterality: N/A;  ? DILATION AND CURETTAGE OF UTERUS    ? THYROID SURGERY    ? thyroid biopsy  ? WISDOM TOOTH EXTRACTION    ? ? ?Family Psychiatric History: Reviewed family psychiatric history from progress note on 10/13/2017. ? ?Family History:  ?Family History  ?Problem Relation Age of Onset  ? Diabetes type II Other   ? Lung cancer Other   ? Heart disease Other   ? Lung cancer Other   ? ADD / ADHD Father   ? Alcohol abuse Maternal Grandfather   ? ? ?  Social History: Reviewed social history from progress note on 10/13/2017. ?Social History  ? ?Socioeconomic History  ? Marital status: Married  ?  Spouse name: michael   ? Number of children: 3  ? Years of education: Not on file  ? Highest education level: Associate degree: occupational, Scientist, product/process development, or vocational program  ?Occupational History  ? Occupation: [employer]  ?  Comment: not employed  ?Tobacco Use  ? Smoking status:  Former  ?  Types: Cigarettes  ?  Quit date: 10/13/2017  ?  Years since quitting: 4.1  ? Smokeless tobacco: Never  ?Vaping Use  ? Vaping Use: Never used  ?Substance and Sexual Activity  ? Alcohol use: Yes  ?  Alcohol/week: 1.0 standard drink  ?  Types: 1 Glasses of wine per week  ?  Comment: occasional   ? Drug use: No  ? Sexual activity: Yes  ?  Partners: Male  ?  Birth control/protection: None, I.U.D.  ?Other Topics Concern  ? Not on file  ?Social History Narrative  ? Not on file  ? ?Social Determinants of Health  ? ?Financial Resource Strain: Not on file  ?Food Insecurity: Not on file  ?Transportation Needs: Not on file  ?Physical Activity: Not on file  ?Stress: Not on file  ?Social Connections: Not on file  ? ? ?Allergies:  ?Allergies  ?Allergen Reactions  ? Iodine Swelling  ? Shrimp [Shellfish Allergy] Swelling  ? ? ?Metabolic Disorder Labs: ?No results found for: HGBA1C, MPG ?No results found for: PROLACTIN ?No results found for: CHOL, TRIG, HDL, CHOLHDL, VLDL, LDLCALC ?Lab Results  ?Component Value Date  ? TSH 0.676 08/20/2020  ? TSH 0.423 (L) 09/26/2017  ? ? ?Therapeutic Level Labs: ?No results found for: LITHIUM ?No results found for: VALPROATE ?No components found for:  CBMZ ? ?Current Medications: ?Current Outpatient Medications  ?Medication Sig Dispense Refill  ? [START ON 12/02/2021] amphetamine-dextroamphetamine (ADDERALL) 20 MG tablet Take 1.5 tablets (30 mg total) by mouth daily. Take one tablet AM and half tablet PM. 45 tablet 0  ? [START ON 12/31/2021] amphetamine-dextroamphetamine (ADDERALL) 20 MG tablet Take 1.5 tablets (30 mg total) by mouth daily. Take 1 tablet daily AM and half tablet daily after lunch 45 tablet 0  ? [START ON 01/29/2022] amphetamine-dextroamphetamine (ADDERALL) 20 MG tablet Take 1.5 tablets (30 mg total) by mouth daily. Take 1 tablet daily AM and half tablet daily after lunch 45 tablet 0  ? tretinoin (RETIN-A) 0.025 % cream SMARTSIG:sparingly Topical Every Evening    ? valACYclovir  (VALTREX) 500 MG tablet Take 1 tablet (500 mg total) by mouth 2 (two) times daily. 20 tablet 1  ? ?No current facility-administered medications for this visit.  ? ? ? ?Musculoskeletal: ?Strength & Muscle Tone:  UTA ?Gait & Station:  Seated ?Patient leans: N/A ? ?Psychiatric Specialty Exam: ?Review of Systems  ?Psychiatric/Behavioral:  Negative for agitation, behavioral problems, confusion, decreased concentration, dysphoric mood, hallucinations, self-injury, sleep disturbance and suicidal ideas. The patient is not nervous/anxious and is not hyperactive.   ?All other systems reviewed and are negative.  ?There were no vitals taken for this visit.There is no height or weight on file to calculate BMI.  ?General Appearance: Casual  ?Eye Contact:  Good  ?Speech:  Clear and Coherent  ?Volume:  Normal  ?Mood:  Euthymic  ?Affect:  Congruent  ?Thought Process:  Goal Directed and Descriptions of Associations: Intact  ?Orientation:  Full (Time, Place, and Person)  ?Thought Content: Logical   ?Suicidal Thoughts:  No  ?  Homicidal Thoughts:  No  ?Memory:  Immediate;   Fair ?Recent;   Fair ?Remote;   Fair  ?Judgement:  Fair  ?Insight:  Fair  ?Psychomotor Activity:  Normal  ?Concentration:  Concentration: Fair and Attention Span: Fair  ?Recall:  Fair  ?Fund of Knowledge: Fair  ?Language: Fair  ?Akathisia:  No  ?Handed:  Right  ?AIMS (if indicated): not done  ?Assets:  Communication Skills ?Desire for Improvement ?Housing ?Intimacy ?Social Support ?Talents/Skills ?Transportation  ?ADL's:  Intact  ?Cognition: WNL  ?Sleep:  Fair  ? ?Screenings: ?GAD-7   ? ?Flowsheet Row Office Visit from 07/27/2021 in [facility] Video Visit from 04/21/2021 in [facility] Video Visit from 02/17/2021 in [facility]  ?Total GAD-7 Score 0 1 0  ? ?  ? ?PHQ2-9   ? ?Flowsheet Row Video Visit from 11/25/2021 in [facility] Office Visit from  07/27/2021 in [facility] Office Visit from 06/09/2021 in [facility] Video Visit from 04/21/2021 in [facility] Office Visit from 04/12/2021 in

## 2021-11-30 ENCOUNTER — Telehealth: Payer: Self-pay | Admitting: Internal Medicine

## 2021-11-30 NOTE — Telephone Encounter (Signed)
Copied from CRM [id number]. Topic: General - Inquiry ?>> Nov 30, 2021  8:50 AM Aretta Nip wrote: ?Pt states that she has had a consult appt with Dr Charlotta Newton about hemorrhoids and wants a referral for general surgeon  but wants to speak with her re maybe cheaper solution? Denies an appt.  Request CB from Dr Seth Bake fu at [phone]. ?

## 2021-12-07 ENCOUNTER — Ambulatory Visit (INDEPENDENT_AMBULATORY_CARE_PROVIDER_SITE_OTHER): Payer: No Typology Code available for payment source | Admitting: Surgery

## 2021-12-07 ENCOUNTER — Other Ambulatory Visit: Payer: Self-pay

## 2021-12-07 ENCOUNTER — Encounter: Payer: Self-pay | Admitting: Surgery

## 2021-12-07 VITALS — BP 120/84 | HR 82 | Temp 98.3°F | Ht 68.0 in | Wt 234.0 lb

## 2021-12-07 DIAGNOSIS — K648 Other hemorrhoids: Secondary | ICD-10-CM

## 2021-12-07 DIAGNOSIS — K644 Residual hemorrhoidal skin tags: Secondary | ICD-10-CM | POA: Diagnosis not present

## 2021-12-07 NOTE — Patient Instructions (Addendum)
Advised to pursue a goal of 25 to 30 g of fiber daily.  Made aware that the majority of this may be through natural sources, but advised to be aware of actual consumption and to ensure minimal consumption by daily supplementation.  Various forms of supplements discussed.  Recommended Psyllium husk, that mixes well with applesauce, or the powder which goes down well shaken with chocolate milk.  ?Strongly advised to consume more fluids to ensure adequate hydration, instructed to watch color of urine to determine adequacy of hydration.  Clarity is pursued in urine output, and bowel activity that correlates to significant meal intake.   ?We need to avoid deferring having bowel movements, advised to take the time at the first sign of sensation, typically following meals, and in the morning.   ?Subsequent utilization of MiraLAX may be needed ensure at least daily movement, ideally twice daily bowel movements.  If multiple doses of MiraLAX are necessary utilize them. ?Never skip a day...  ?To be regular, we must do the above EVERY day.  ? ?Constipation, Adult ?Constipation is when a person has fewer than three bowel movements in a week, has difficulty having a bowel movement, or has stools (feces) that are dry, hard, or larger than normal. Constipation may be caused by an underlying condition. It may become worse with age if a person takes certain medicines and does not take in enough fluids. ?Follow these instructions at home: ?Eating and drinking ? ?Eat foods that have a lot of fiber, such as beans, whole grains, and fresh fruits and vegetables. ?Limit foods that are low in fiber and high in fat and processed sugars, such as fried or sweet foods. These include french fries, hamburgers, cookies, candies, and soda. ?Drink enough fluid to keep your urine pale yellow. ?General instructions ?Exercise regularly or as told by your health care provider. Try to do 150 minutes of moderate exercise each week. ?Use the bathroom when  you have the urge to go. Do not hold it in. ?Take over-the-counter and prescription medicines only as told by your health care provider. This includes any fiber supplements. ?During bowel movements: ?Practice deep breathing while relaxing the lower abdomen. ?Practice pelvic floor relaxation. ?Watch your condition for any changes. Let your health care provider know about them. ?Keep all follow-up visits as told by your health care provider. This is important. ?Contact a health care provider if: ?You have pain that gets worse. ?You have a fever. ?You do not have a bowel movement after 4 days. ?You vomit. ?You are not hungry or you lose weight. ?You are bleeding from the opening between the buttocks (anus). ?You have thin, pencil-like stools. ?Get help right away if: ?You have a fever and your symptoms suddenly get worse. ?You leak stool or have blood in your stool. ?Your abdomen is bloated. ?You have severe pain in your abdomen. ?You feel dizzy or you faint. ?Summary ?Constipation is when a person has fewer than three bowel movements in a week, has difficulty having a bowel movement, or has stools (feces) that are dry, hard, or larger than normal. ?Eat foods that have a lot of fiber, such as beans, whole grains, and fresh fruits and vegetables. ?Drink enough fluid to keep your urine pale yellow. ?Take over-the-counter and prescription medicines only as told by your health care provider. This includes any fiber supplements. ?This information is not intended to replace advice given to you by your health care provider. Make sure you discuss any questions you have with  your health care provider. ?Document Revised: 07/17/2019 Document Reviewed: 07/17/2019 ?Elsevier Patient Education ? 2022 [employer] ? ? ? ?Hemorrhoids ?Hemorrhoids are swollen veins in and around the rectum or anus. There are two types of hemorrhoids: ?Internal hemorrhoids. These occur in the veins that are just inside the rectum. They may poke through  to the outside and become irritated and painful. ?External hemorrhoids. These occur in the veins that are outside the anus and can be felt as a painful swelling or hard lump near the anus. ?Most hemorrhoids do not cause serious problems, and they can be managed with home treatments such as diet and lifestyle changes. If home treatments do not help the symptoms, procedures can be done to shrink or remove the hemorrhoids. ?What are the causes? ?This condition is caused by increased pressure in the anal area. This pressure may result from various things, including: ?Constipation. ?Straining to have a bowel movement. ?Diarrhea. ?Pregnancy. ?Obesity. ?Sitting for long periods of time. ?Heavy lifting or other activity that causes you to strain. ?Anal sex. ?Riding a bike for a long period of time. ?What are the signs or symptoms? ?Symptoms of this condition include: ?Pain. ?Anal itching or irritation. ?Rectal bleeding. ?Leakage of stool (feces). ?Anal swelling. ?One or more lumps around the anus. ?How is this diagnosed? ?This condition can often be diagnosed through a visual exam. Other exams or tests may also be done, such as: ?An exam that involves feeling the rectal area with a gloved hand (digital rectal exam). ?An exam of the anal canal that is done using a small tube (anoscope). ?A blood test, if you have lost a significant amount of blood. ?A test to look inside the colon using a flexible tube with a camera on the end (sigmoidoscopy or colonoscopy). ?How is this treated? ?This condition can usually be treated at home. However, various procedures may be done if dietary changes, lifestyle changes, and other home treatments do not help your symptoms. These procedures can help make the hemorrhoids smaller or remove them completely. Some of these procedures involve surgery, and others do not. Common procedures include: ?Rubber band ligation. Rubber bands are placed at the base of the hemorrhoids to cut off their blood  supply. ?Sclerotherapy. Medicine is injected into the hemorrhoids to shrink them. ?Infrared coagulation. A type of light energy is used to get rid of the hemorrhoids. ?Hemorrhoidectomy surgery. The hemorrhoids are surgically removed, and the veins that supply them are tied off. ?Stapled hemorrhoidopexy surgery. The surgeon staples the base of the hemorrhoid to the rectal wall. ?Follow these instructions at home: ?Eating and drinking ? ?Eat foods that have a lot of fiber in them, such as whole grains, beans, nuts, fruits, and vegetables. ?Ask your health care provider about taking products that have added fiber (fiber supplements). ?Reduce the amount of fat in your diet. You can do this by eating low-fat dairy products, eating less red meat, and avoiding processed foods. ?Drink enough fluid to keep your urine pale yellow. ?Managing pain and swelling ? ?Take warm sitz baths for 20 minutes, 3-4 times a day to ease pain and discomfort. You may do this in a bathtub or using a portable sitz bath that fits over the toilet. ?If directed, apply ice to the affected area. Using ice packs between sitz baths may be helpful. ?Put ice in a plastic bag. ?Place a towel between your skin and the bag. ?Leave the ice on for 20 minutes, 2-3 times a day. ?General instructions ?Take over-the-counter  and prescription medicines only as told by your health care provider. ?Use medicated creams or suppositories as told. ?Get regular exercise. Ask your health care provider how much and what kind of exercise is best for you. In general, you should do moderate exercise for at least 30 minutes on most days of the week (150 minutes each week). This can include activities such as walking, biking, or yoga. ?Go to the bathroom when you have the urge to have a bowel movement. Do not wait. ?Avoid straining to have bowel movements. ?Keep the anal area dry and clean. Use wet toilet paper or moist towelettes after a bowel movement. ?Do not sit on the  toilet for long periods of time. This increases blood pooling and pain. ?Keep all follow-up visits as told by your health care provider. This is important. ?Contact a health care provider if you have: ?Incr

## 2021-12-07 NOTE — Progress Notes (Signed)
Patient ID: Kimberly Knapp, female   DOB: April 14, 1986, 36 y.o.   MRN: [id number] ? ?Chief Complaint: Hemorrhoids ? ?History of Present Illness ?[name] is a 36 y.o. female with both an external and internal hemorrhoids, challenging hygienic cleansing, questioning possible seepage soilage.  Prior history of constipation, no consistent/persistent utilization of fiber supplements or laxatives.  Recurring flareups after more difficult movements, episodes of constipation with associated inflammation.  Utilizing topicals of Preparation H to treat pain/burning/itching with periodic flareups.  No current bleeding. ?Some pain with bowel movements, with a challenging feel as though there is no lumen/space for the passage of the stool. ? ?Past Medical History ?Past Medical History:  ?Diagnosis Date  ? ADHD (attention deficit hyperactivity disorder)   ? Depression   ? Goiter   ? Herpes genitalis   ? Obesity (BMI 30.0-34.9)   ? Pregnancy induced hypertension   ?  ? ? ?Past Surgical History:  ?Procedure Laterality Date  ? BREAST ENHANCEMENT SURGERY Bilateral   ? CESAREAN SECTION N/A 05/29/2015  ? Procedure: CESAREAN SECTION;  Surgeon: Honor Loh [name], MD;  Location: [facility] OR[facility];  Service: Obstetrics;  Laterality: N/A;  ? CESAREAN SECTION N/A 06/25/2018  ? Procedure: CESAREAN SECTION, repeat;  Surgeon: [name], Honor Loh, MD;  Location: [facility];  Service: Obstetrics;  Laterality: N/A;  ? DILATION AND CURETTAGE OF UTERUS    ? THYROID SURGERY    ? thyroid biopsy  ? WISDOM TOOTH EXTRACTION    ? ? ?Allergies  ?Allergen Reactions  ? Iodine Swelling  ? Shrimp [Shellfish Allergy] Swelling  ? ? ?Current Outpatient Medications  ?Medication Sig Dispense Refill  ? [START ON 12/31/2021] amphetamine-dextroamphetamine (ADDERALL) 20 MG tablet Take 1.5 tablets (30 mg total) by mouth daily. Take 1 tablet daily AM and half tablet daily after lunch 45 tablet 0  ? [START ON 01/29/2022] amphetamine-dextroamphetamine (ADDERALL) 20 MG tablet Take 1.5  tablets (30 mg total) by mouth daily. Take 1 tablet daily AM and half tablet daily after lunch 45 tablet 0  ? tretinoin (RETIN-A) 0.025 % cream SMARTSIG:sparingly Topical Every Evening    ? valACYclovir (VALTREX) 500 MG tablet Take 1 tablet (500 mg total) by mouth 2 (two) times daily. 20 tablet 1  ? ?No current facility-administered medications for this visit.  ? ? ?Family History ?Family History  ?Problem Relation Age of Onset  ? Diabetes type II Other   ? Lung cancer Other   ? Heart disease Other   ? Lung cancer Other   ? ADD / ADHD Father   ? Alcohol abuse Maternal Grandfather   ?  ? ? ?Social History ?Social History  ? ?Tobacco Use  ? Smoking status: Former  ?  Types: Cigarettes  ?  Quit date: 10/13/2017  ?  Years since quitting: 4.1  ? Smokeless tobacco: Never  ?Vaping Use  ? Vaping Use: Never used  ?Substance Use Topics  ? Alcohol use: Yes  ?  Alcohol/week: 1.0 standard drink  ?  Types: 1 Glasses of wine per week  ?  Comment: occasional   ? Drug use: No  ?  ?  ?Review of Systems  ?Constitutional: Negative.   ?HENT: Negative.    ?Eyes: Negative.   ?Respiratory: Negative.    ?Cardiovascular: Negative.   ?Gastrointestinal:  Positive for constipation. Negative for abdominal pain, blood in stool, diarrhea and melena.  ?Genitourinary: Negative.   ?Skin: Negative.   ?Neurological: Negative.   ?Psychiatric/Behavioral: Negative.    ?  ? ?Physical Exam ?  Blood pressure 120/84, pulse 82, temperature 98.3 ?F (36.8 ?C), temperature source Oral, height 5\' 8"  (1.727 m), weight 234 lb (106.1 kg), SpO2 100 %. ?Last Weight  Most recent update: 12/07/2021 10:58 AM  ? ? Weight  ?106.1 kg (234 lb)  ?      ? ?  ? ? ?CONSTITUTIONAL: Well developed, and nourished, appropriately responsive and aware without distress.   ?EYES: Sclera non-icteric.   ?EARS, NOSE, MOUTH AND THROAT:  The oropharynx is clear. Oral mucosa is pink and moist.    Hearing is intact to voice.  ?NECK: Trachea is midline, and there is no jugular venous distension.   ?LYMPH NODES:  Lymph nodes in the neck are not enlarged. ?RESPIRATORY:  Lungs are clear, and breath sounds are equal bilaterally. Normal respiratory effort without pathologic use of accessory muscles. ?CARDIOVASCULAR: Heart is regular in rate and rhythm. ?GI: The abdomen is soft, nontender, and nondistended. There were no palpable masses. I did not appreciate hepatosplenomegaly. There were normal bowel sounds. ?GU: Kimberly Knapp is present as chaperone.  There are 3 prominent areas of redundant external hemorrhoidal tags with a slight degree of internal prolapse present.  There is also fair hemorrhoidal columnar redundancy throughout the anal canal, without evidence of thrombosis or inflammation currently.  This may be a grade 2.5-3 internal hemorrhoids with associated external hemorrhoids.  There is a degree of laxity anteriorly consistent with a rectocele. ?MUSCULOSKELETAL:  Symmetrical muscle tone appreciated in all four extremities.    ?SKIN: Skin turgor is normal. No pathologic skin lesions appreciated.  ?NEUROLOGIC:  Motor and sensation appear grossly normal.  Cranial nerves are grossly without defect. ?PSYCH:  Alert and oriented to person, place and time. Affect is appropriate for situation. ? ?Data Reviewed ?I have personally reviewed what is currently available of the patient's imaging, recent labs and medical records.   ?Labs:  ? ?  Latest Ref Rng & Units 08/20/2020  ?  4:30 PM 02/06/2020  ? 10:30 AM 06/26/2018  ?  6:06 AM  ?CBC  ?WBC 3.4 - 10.8 x10E3/uL 7.2   8.5   9.3    ?Hemoglobin 11.1 - 15.9 g/dL 14.9   13.9   9.4    ?Hematocrit 34.0 - 46.6 % 43.5   40.8   27.7    ?Platelets 150 - 450 x10E3/uL 344   339   184    ? ? ?  Latest Ref Rng & Units 08/20/2020  ?  4:30 PM 02/06/2020  ? 10:30 AM 06/27/2018  ? 11:43 AM  ?CMP  ?Glucose 65 - 99 mg/dL 92   98     ?BUN 6 - 20 mg/dL 11   14     ?Creatinine 0.57 - 1.00 mg/dL 0.82   0.70   0.50    ?Sodium 134 - 144 mmol/L 139   141     ?Potassium 3.5 - 5.2 mmol/L 4.0   3.5      ?Chloride 96 - 106 mmol/L 105   111     ?CO2 20 - 29 mmol/L 19   23     ?Calcium 8.7 - 10.2 mg/dL 9.5   8.8     ?Total Protein 6.0 - 8.5 g/dL 6.9      ?Total Bilirubin 0.0 - 1.2 mg/dL 0.3      ?Alkaline Phos 44 - 121 IU/L 73      ?AST 0 - 40 IU/L 17      ?ALT 0 - 32 IU/L 14      ? ? ? ? ?  Imaging: ? ?Within last 24 hrs: No results found. ? ?Assessment ?   ?Grade 2+ internal hemorrhoids with associated external hemorrhoidal tags. ?Intermittent constipation. ?Patient Active Problem List  ? Diagnosis Date Noted  ? Word finding difficulty 07/27/2021  ? Annual physical exam 06/09/2021  ? Internal and external hemorrhoids without complication 0000000  ? High risk medication use 02/17/2021  ? Chronic low back pain 02/08/2021  ? Premature atrial contractions 09/02/2020  ? Heart palpitations 09/02/2020  ? Premature ventricular contractions 09/02/2020  ? MDD (major depressive disorder), recurrent, in full remission ([place]) 11/28/2019  ? Pelvic cramping 07/04/2019  ? MDD (major depressive disorder), recurrent episode, mild ([place]) 04/18/2019  ? GAD (generalized anxiety disorder) 04/18/2019  ? Breech presentation, antepartum 06/18/2018  ? History of cesarean delivery, currently pregnant 06/18/2018  ? Labor and delivery indication for care or intervention 06/08/2018  ? Elevated blood pressure affecting pregnancy in third trimester, antepartum 05/31/2018  ? Obesity affecting pregnancy in first trimester 12/26/2017  ? Attention deficit hyperactivity disorder (ADHD), predominantly inattentive type 09/26/2017  ? Supervision of high-risk pregnancy 05/28/2015  ? Gestational hypertension w/o significant proteinuria in 3rd trimester 05/28/2015  ? Herpes genitalis   ? ? ?Plan ?   ?Advised to pursue a goal of 25 to 30 g of fiber daily.  Made aware that the majority of this may be through natural sources, but advised to be aware of actual consumption and to ensure minimal consumption by daily supplementation.  Various forms of supplements  discussed.  Recommended Psyllium husk, that mixes well with applesauce, or the powder which goes down well shaken with chocolate milk.  ?Strongly advised to consume more fluids to ensure adequate hydration,

## 2022-02-05 IMAGING — CR DG CHEST 2V
1 series · 2 of 2 positions shown · non-contrast
Comparison: None.

CLINICAL DATA: Chest pain/pressure with arrhythmia type feeling
since yesterday.

EXAM:
CHEST - 2 VIEW

[Series 1: dg chest 2 view · 0.14mm/px · 2 of 2 slices shown]
[im 1/2]
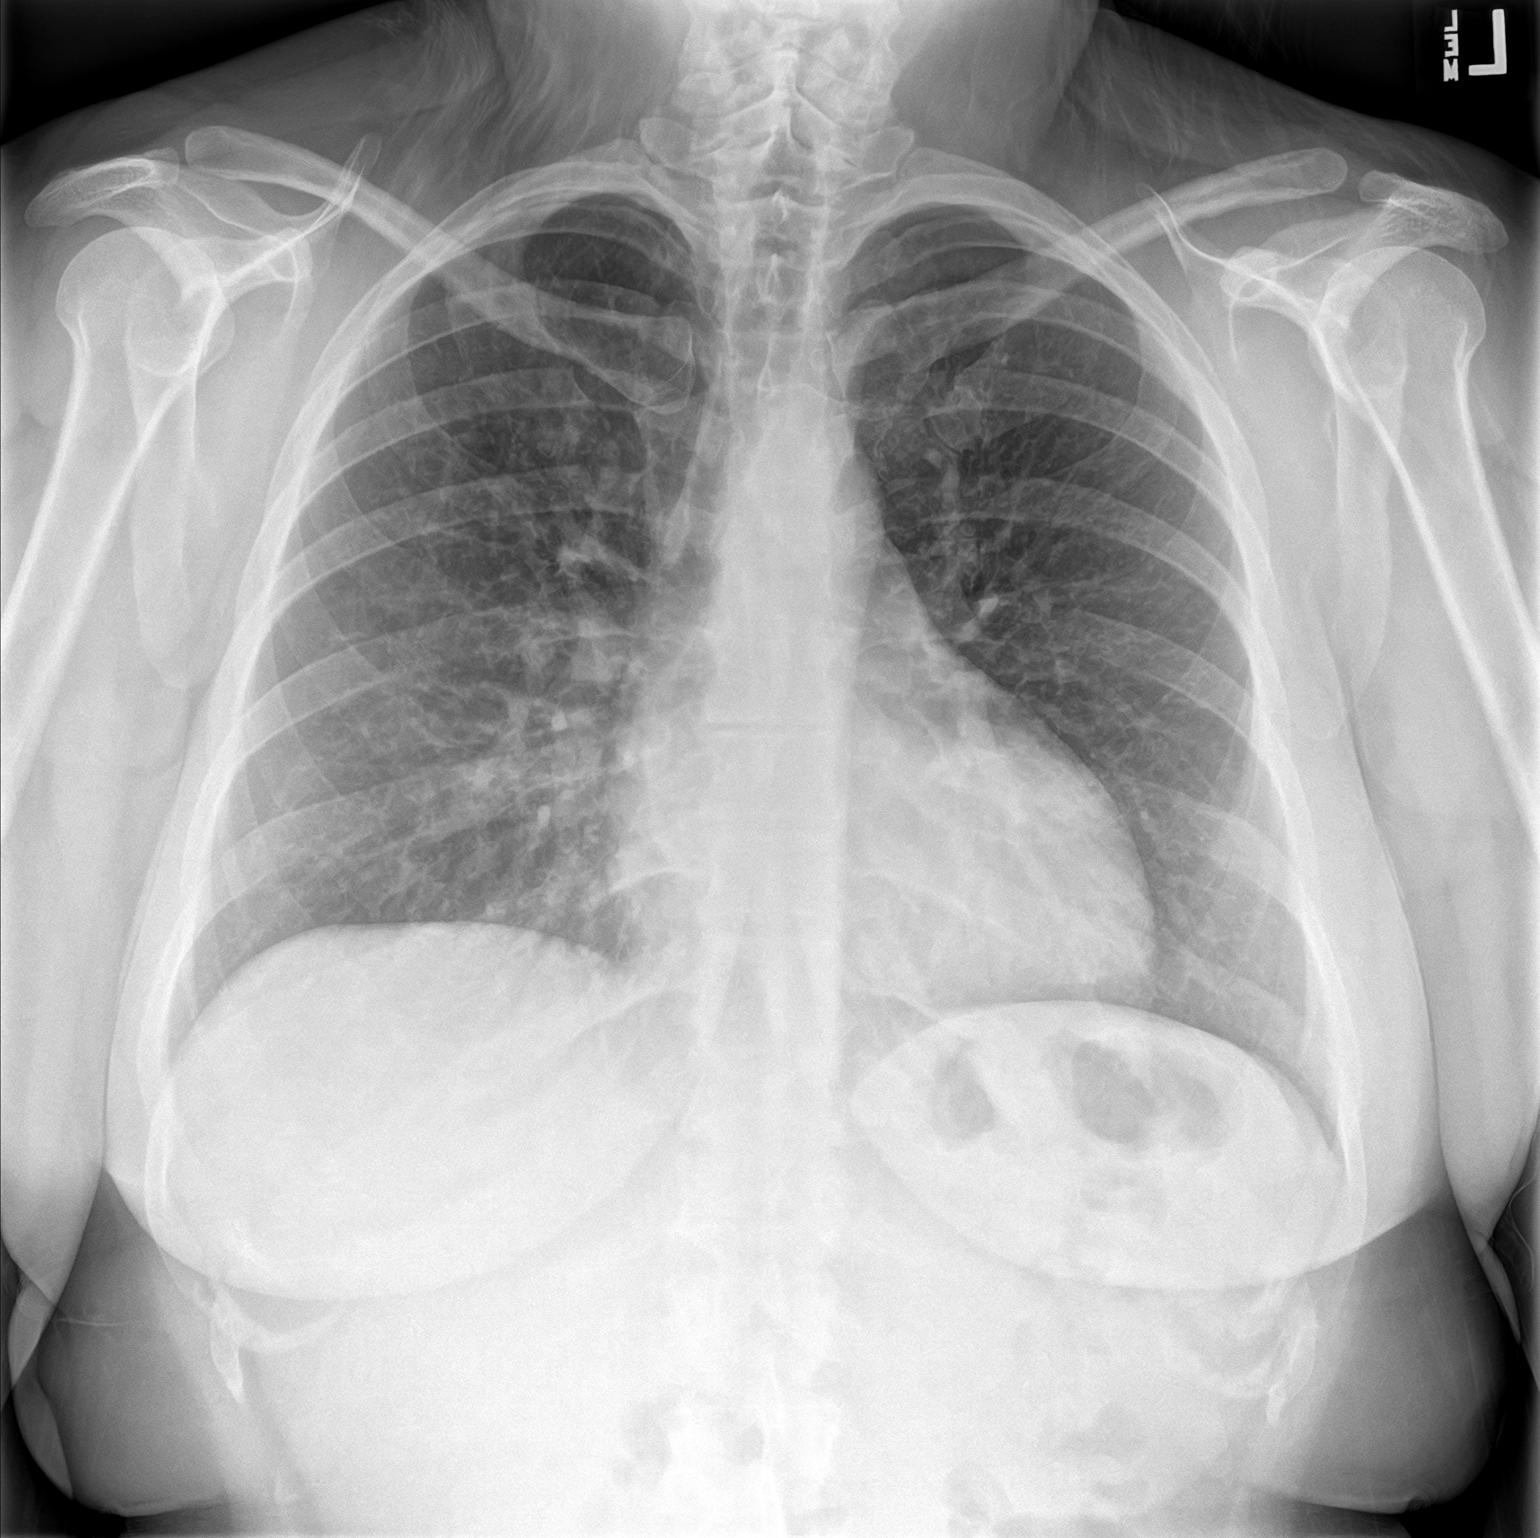
[im 2/2]
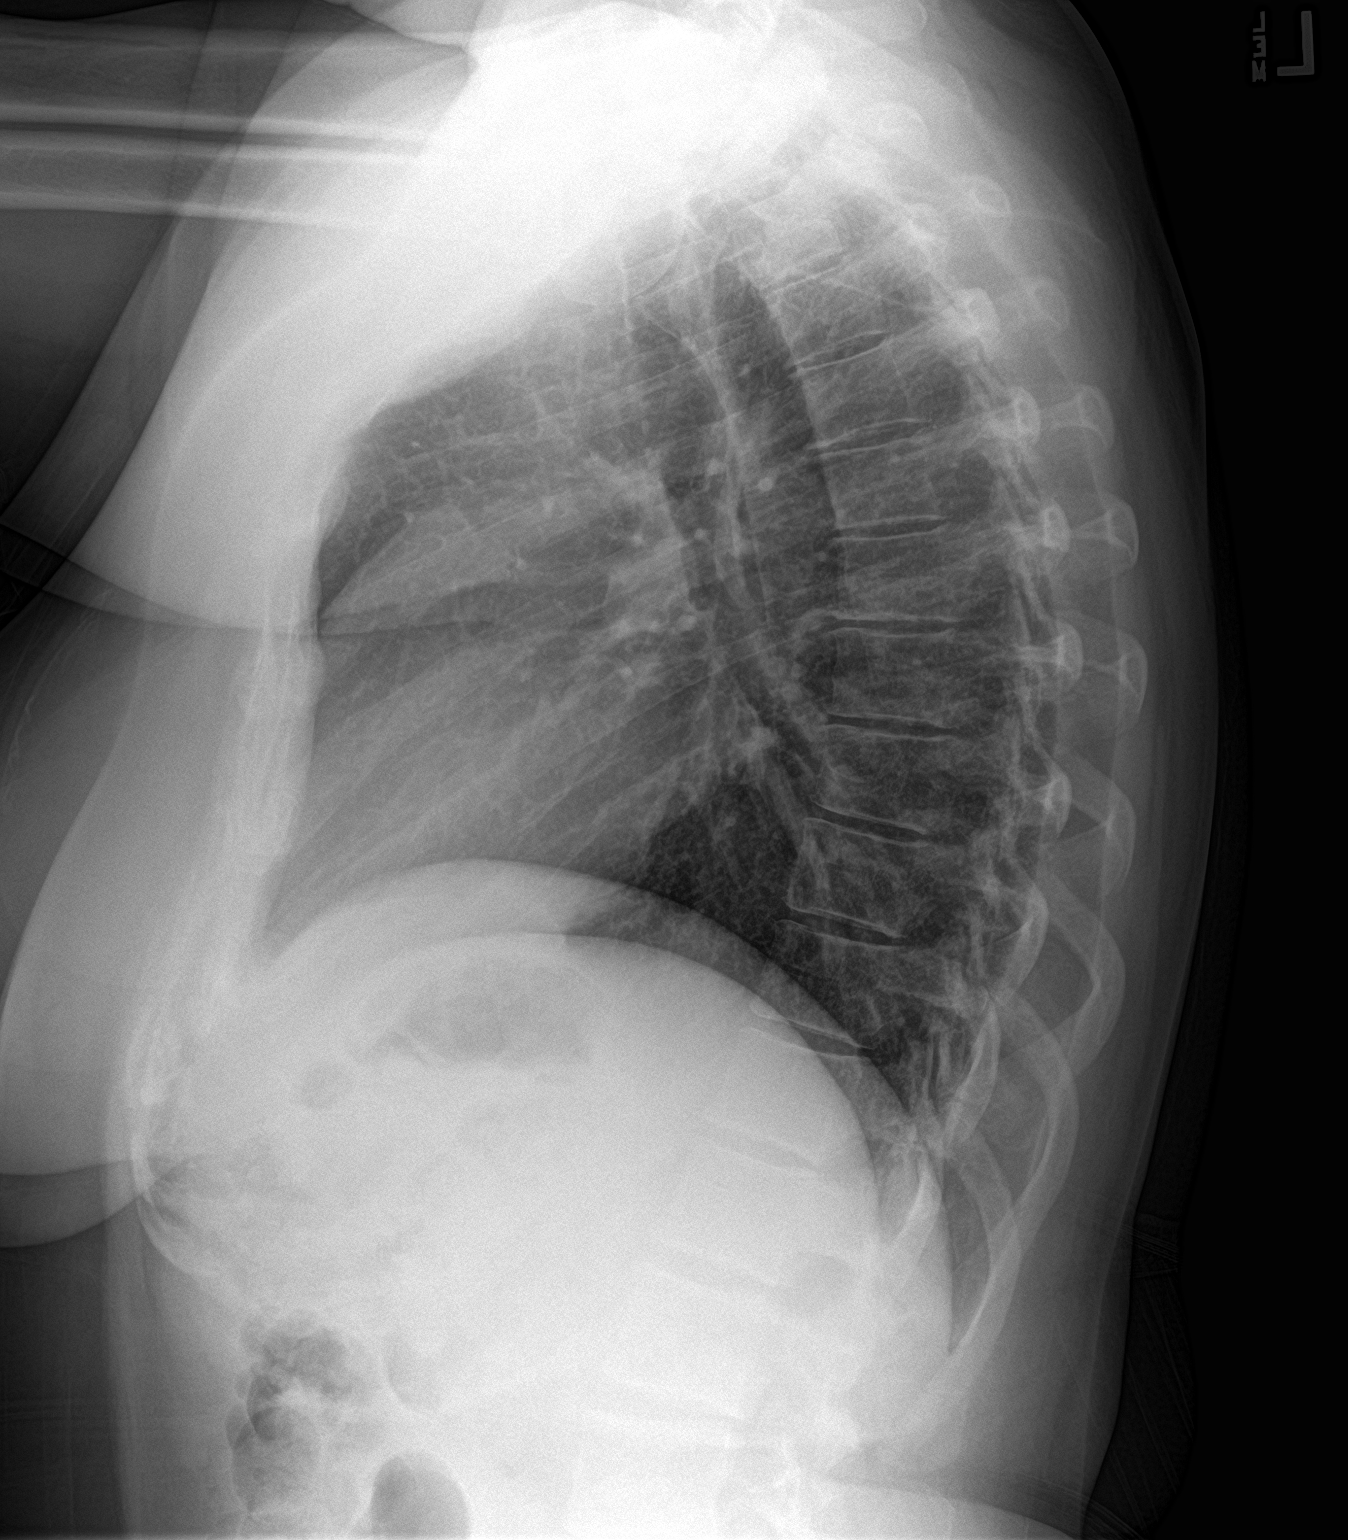

[2 of 2 positions shown; findings below may reference images not displayed]

FINDINGS: Lungs are adequately inflated and otherwise clear. Cardiomediastinal
silhouette, bones and soft tissues are normal.
IMPRESSION: No active cardiopulmonary disease.

## 2022-02-08 ENCOUNTER — Ambulatory Visit: Payer: No Typology Code available for payment source | Admitting: Surgery

## 2022-02-24 ENCOUNTER — Telehealth (INDEPENDENT_AMBULATORY_CARE_PROVIDER_SITE_OTHER): Payer: No Typology Code available for payment source | Admitting: Psychiatry

## 2022-02-24 ENCOUNTER — Encounter: Payer: Self-pay | Admitting: Psychiatry

## 2022-02-24 DIAGNOSIS — F411 Generalized anxiety disorder: Secondary | ICD-10-CM

## 2022-02-24 DIAGNOSIS — F900 Attention-deficit hyperactivity disorder, predominantly inattentive type: Secondary | ICD-10-CM | POA: Diagnosis not present

## 2022-02-24 DIAGNOSIS — F3342 Major depressive disorder, recurrent, in full remission: Secondary | ICD-10-CM | POA: Diagnosis not present

## 2022-02-24 MED ORDER — AMPHETAMINE-DEXTROAMPHETAMINE 20 MG PO TABS
30.0000 mg | ORAL_TABLET | Freq: Every day | ORAL | 0 refills | Status: DC
Start: 2022-04-06 — End: 2022-05-06

## 2022-02-24 MED ORDER — AMPHETAMINE-DEXTROAMPHETAMINE 20 MG PO TABS: 30.0000 mg | ORAL_TABLET | Freq: Every day | ORAL | 0 refills | Status: DC

## 2022-02-24 NOTE — Progress Notes (Signed)
Virtual Visit via Video Note  I connected with Kimberly Knapp on 02/24/22 at 10:40 AM EDT by a video enabled telemedicine application and verified that I am speaking with the correct person using two identifiers.  Location Provider Location : [facility] Patient Location : Kimberly Knapp  Participants: Patient , Provider   I discussed the limitations of evaluation and management by telemedicine and the availability of in person appointments. The patient expressed understanding and agreed to proceed.   I discussed the assessment and treatment plan with the patient. The patient was provided an opportunity to ask questions and all were answered. The patient agreed with the plan and demonstrated an understanding of the instructions.   The patient was advised to call back or seek an in-person evaluation if the symptoms worsen or if the condition fails to improve as anticipated.  Video connection was lost at less than 50% of the duration of the visit, at which time the remainder of the visit was completed through audio only   [facility] MD OP Progress Note  02/24/2022 1:18 PM LISETT DIRUSSO  MRN:  [id number]  Chief Complaint:  Chief Complaint  Patient presents with   Follow-up: 36 year old female, married, has a history of MDD, GAD, ADHD, presented for medication management.   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  Patient today reports she is currently at the beach having a vacation.  She is enjoying her time.  Denies any sadness, anxiety, lack of motivation.  Reports concentration and focus is good on the current dosage of Adderall.  Reports appetite as good.  Reports sleep as good.  Denies any suicidality, homicidality or perceptual disturbances.  Denies any other concerns at this time.  Visit Diagnosis:    ICD-10-CM   1. MDD (major depressive disorder),  recurrent, in full remission (HCC)  F33.42     2. GAD (generalized anxiety disorder)  F41.1     3. Attention deficit hyperactivity disorder (ADHD), predominantly inattentive type  F90.0 amphetamine-dextroamphetamine (ADDERALL) 20 MG tablet    amphetamine-dextroamphetamine (ADDERALL) 20 MG tablet      Past Psychiatric History: Reviewed past psychiatric history from progress note on 10/13/2017.  Past trials of Zoloft, Prozac, Celexa, Wellbutrin, Adderall.  Past Medical History:  Past Medical History:  Diagnosis Date   ADHD (attention deficit hyperactivity disorder)    Depression    Goiter    Herpes genitalis    Obesity (BMI 30.0-34.9)    Pregnancy induced hypertension     Past Surgical History:  Procedure Laterality Date   BREAST ENHANCEMENT SURGERY Bilateral    CESAREAN SECTION N/A 05/29/2015   Procedure: CESAREAN SECTION;  Surgeon: [name], MD;  Location: [facility] ORS;  Service: Obstetrics;  Laterality: N/A;   CESAREAN SECTION N/A 06/25/2018   Procedure: CESAREAN SECTION, repeat;  Surgeon: [name], Elenora Fender, MD;  Location: [facility] ORS;  Service: Obstetrics;  Laterality: N/A;   DILATION AND CURETTAGE OF UTERUS     THYROID SURGERY     thyroid biopsy   WISDOM TOOTH EXTRACTION      Family Psychiatric History: Reviewed family psychiatric history from progress note on 10/13/2017.  Family History:  Family History  Problem Relation Age of Onset   Diabetes type II Other    Lung cancer Other    Heart disease Other    Lung cancer Other    ADD / ADHD  Father    Alcohol abuse Maternal Grandfather     Social History: Reviewed social history from progress note on 10/13/2017. Social History   Socioeconomic History   Marital status: Married    Spouse name: michael    Number of children: 3   Years of education: Not on file   Highest education level: Associate degree: occupational, Hotel manager, or vocational program  Occupational History   Occupation: [employer]    Comment: not employed   Tobacco Use   Smoking status: Former    Types: Cigarettes    Quit date: 10/13/2017    Years since quitting: 4.3   Smokeless tobacco: Never  Vaping Use   Vaping Use: Never used  Substance and Sexual Activity   Alcohol use: Yes    Alcohol/week: 1.0 standard drink of alcohol    Types: 1 Glasses of wine per week    Comment: occasional    Drug use: No   Sexual activity: Yes    Partners: Male    Birth control/protection: None, I.U.D.  Other Topics Concern   Not on file  Social History Narrative   Not on file   Social Determinants of Health   Financial Resource Strain: Low Risk  (10/13/2017)   Overall Financial Resource Strain (CARDIA)    Difficulty of Paying Living Expenses: Not hard at all  Food Insecurity: No Food Insecurity (10/13/2017)   Hunger Vital Sign    Worried About Running Out of Food in the Last Year: Never true    Ran Out of Food in the Last Year: Never true  Transportation Needs: No Transportation Needs (10/13/2017)   PRAPARE - Hydrologist (Medical): No    Lack of Transportation (Non-Medical): No  Physical Activity: Inactive (10/13/2017)   Exercise Vital Sign    Days of Exercise per Week: 0 days    Minutes of Exercise per Session: 0 min  Stress: Not on file  Social Connections: Moderately Integrated (10/13/2017)   Social Connection and Isolation Panel [NHANES]    Frequency of Communication with Friends and Family: Three times a week    Frequency of Social Gatherings with Friends and Family: Once a week    Attends Religious Services: More than 4 times per year    Active Member of [employer] or Organizations: No    Attends Archivist Meetings: Never    Marital Status: Married    Allergies:  Allergies  Allergen Reactions   Iodine Swelling   Shrimp [Shellfish Allergy] Swelling    Metabolic Disorder Labs: No results found for: "HGBA1C", "MPG" No results found for: "PROLACTIN" No results found for: "CHOL", "TRIG", "HDL", "CHOLHDL",  "VLDL", "LDLCALC" Lab Results  Component Value Date   TSH 0.676 08/20/2020   TSH 0.423 (L) 09/26/2017    Therapeutic Level Labs: No results found for: "LITHIUM" No results found for: "VALPROATE" No results found for: "CBMZ"  Current Medications: Current Outpatient Medications  Medication Sig Dispense Refill   tretinoin (RETIN-A) 0.025 % cream SMARTSIG:sparingly Topical Every Evening     [START ON 03/08/2022] amphetamine-dextroamphetamine (ADDERALL) 20 MG tablet Take 1.5 tablets (30 mg total) by mouth daily. Take 1 tablet daily AM and half tablet daily after lunch 45 tablet 0   [START ON 04/06/2022] amphetamine-dextroamphetamine (ADDERALL) 20 MG tablet Take 1.5 tablets (30 mg total) by mouth daily. Take 1 tablet daily AM and half tablet daily after lunch 45 tablet 0   valACYclovir (VALTREX) 500 MG tablet Take 1 tablet (500 mg total) by  mouth 2 (two) times daily. 20 tablet 1   No current facility-administered medications for this visit.     Musculoskeletal: Strength & Muscle Tone:  UTA Gait & Station:  Seated Patient leans: N/A  Psychiatric Specialty Exam: Review of Systems  Psychiatric/Behavioral: Negative.    All other systems reviewed and are negative.   There were no vitals taken for this visit.There is no height or weight on file to calculate BMI.  General Appearance: Casual  Eye Contact:  Fair  Speech:  Clear and Coherent  Volume:  Normal  Mood:  Euthymic  Affect:  Congruent  Thought Process:  Goal Directed and Descriptions of Associations: Intact  Orientation:  Full (Time, Place, and Person)  Thought Content: Logical   Suicidal Thoughts:  No  Homicidal Thoughts:  No  Memory:  Immediate;   Fair Recent;   Fair Remote;   Fair  Judgement:  Fair  Insight:  Fair  Psychomotor Activity:  Normal  Concentration:  Concentration: Fair and Attention Span: Fair  Recall:  [employer] of Knowledge: Fair  Language: Fair  Akathisia:  No  Handed:  Right  AIMS (if indicated):  not done  Assets:  Communication Skills Desire for Improvement Housing Intimacy Social Support Talents/Skills Transportation  ADL's:  Intact  Cognition: WNL  Sleep:  Fair   Screenings: GAD-7    Flowsheet Row Office Visit from 07/27/2021 in [facility] Video Visit from 04/21/2021 in [facility] Video Visit from 02/17/2021 in [facility]  Total GAD-7 Score 0 1 0      PHQ2-9    Flowsheet Row Video Visit from 02/24/2022 in [facility] Video Visit from 11/25/2021 in [facility] Office Visit from 07/27/2021 in [facility] Office Visit from 06/09/2021 in [facility] Video Visit from 04/21/2021 in [facility]  PHQ-2 Total Score 0 0 0 0 0  PHQ-9 Total Score -- -- -- 0 1      Flowsheet Row Video Visit from 02/24/2022 in [facility] Video Visit from 11/25/2020 in [facility]  C-SSRS RISK CATEGORY No Risk No Risk        Assessment and Plan: KENDRE SIRES is a 36 year old Caucasian female who has a history of MDD, GAD, ADD was evaluated by telemedicine today.  Patient is currently stable.  Plan MDD in remission Continue to monitor closely.   GAD-stable Continue relaxation techniques.  ADHD-stable Adderall 30 mg p.o. daily in divided dosage.  Provided 2 prescriptions with date specified. Reviewed [place] PMP aware.  Patient to continue to monitor her blood pressure.  Patient also advised to establish care with a new primary care provider.      Collaboration of Care: Collaboration of Care: Primary Care Provider AEB advised to establish care with primary care provider.  Patient/Guardian was advised Release of Information must be obtained prior to any record release in order to collaborate their care with an outside  provider. Patient/Guardian was advised if they have not already done so to contact the registration department to sign all necessary forms in order for [place] to release information regarding their care.   Consent: Patient/Guardian gives verbal consent for treatment and assignment of benefits for services provided during this visit. Patient/Guardian expressed understanding and agreed to proceed.    I have spent at least 12 minutes non face to face with patient today.   This note was generated in part or  whole with voice recognition software. Voice recognition is usually quite accurate but there are transcription errors that can and very often do occur. I apologize for any typographical errors that were not detected and corrected.     Ursula Alert, MD 02/24/2022, 1:18 PM

## 2022-05-05 ENCOUNTER — Telehealth: Payer: Self-pay

## 2022-05-05 NOTE — Telephone Encounter (Signed)
Noted! Thank you

## 2022-05-05 NOTE — Telephone Encounter (Signed)
pt camed into office today thought her appt was for today. she.  per dr. Elna Breslow order get vitals and have her appt virtual tomorrow. pt b//P is 129/97,  pulse 85   temp 98.8  weight 228.6lbs

## 2022-05-06 ENCOUNTER — Ambulatory Visit: Payer: No Typology Code available for payment source | Admitting: Psychiatry

## 2022-05-06 ENCOUNTER — Telehealth (INDEPENDENT_AMBULATORY_CARE_PROVIDER_SITE_OTHER): Payer: No Typology Code available for payment source | Admitting: Psychiatry

## 2022-05-06 ENCOUNTER — Encounter: Payer: Self-pay | Admitting: Psychiatry

## 2022-05-06 DIAGNOSIS — F330 Major depressive disorder, recurrent, mild: Secondary | ICD-10-CM | POA: Diagnosis not present

## 2022-05-06 DIAGNOSIS — F900 Attention-deficit hyperactivity disorder, predominantly inattentive type: Secondary | ICD-10-CM

## 2022-05-06 DIAGNOSIS — F4323 Adjustment disorder with mixed anxiety and depressed mood: Secondary | ICD-10-CM | POA: Insufficient documentation

## 2022-05-06 DIAGNOSIS — F411 Generalized anxiety disorder: Secondary | ICD-10-CM

## 2022-05-06 MED ORDER — AMPHETAMINE-DEXTROAMPHETAMINE 20 MG PO TABS
30.0000 mg | ORAL_TABLET | Freq: Every day | ORAL | 0 refills | Status: DC
Start: 2022-06-05 — End: 2022-07-04

## 2022-05-06 MED ORDER — AMPHETAMINE-DEXTROAMPHETAMINE 20 MG PO TABS: 30.0000 mg | ORAL_TABLET | Freq: Every day | ORAL | 0 refills | Status: DC

## 2022-05-06 MED ORDER — ESCITALOPRAM OXALATE 5 MG PO TABS: 5.0000 mg | ORAL_TABLET | Freq: Every day | ORAL | 1 refills | Status: DC

## 2022-05-06 NOTE — Progress Notes (Signed)
Virtual Visit via Video Note  I connected with Kimberly Knapp on 05/06/22 at 10:30 AM EDT by a video enabled telemedicine application and verified that I am speaking with the correct person using two identifiers.  Location Provider Location : [facility] Patient Location : Home  Participants: Patient , Provider    I discussed the limitations of evaluation and management by telemedicine and the availability of in person appointments. The patient expressed understanding and agreed to proceed.    I discussed the assessment and treatment plan with the patient. The patient was provided an opportunity to ask questions and all were answered. The patient agreed with the plan and demonstrated an understanding of the instructions.   The patient was advised to call back or seek an in-person evaluation if the symptoms worsen or if the condition fails to improve as anticipated.    [facility] MD OP Progress Note  05/06/2022 12:47 PM Kimberly Knapp  MRN:  [id number]  Chief Complaint:  Chief Complaint  Patient presents with   Follow-up: 35 year old female, married, has a history of depression, anxiety, ADHD, presented for medication management.   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  Patient today reports she has been struggling with relationship struggles with her spouse.  This has been going on since the past several months.  She reports that they may have tried family therapy in the past.  Patient reports she is planning to talk to her spouse about starting family counseling again.  She reports current situational stressors does make her sad, frustrated.  She also struggles with lack of motivation, anhedonia, getting worse since the past few weeks.  Currently not on an antidepressant.  May have tried Lexapro in the past.  She however currently denies any sleep  problems.  Denies any suicidality, homicidality or perceptual disturbances.  Currently compliant on the Adderall.  Reports the Adderall is helpful.  Denies side effects.  Patient denies any other concerns today.  Visit Diagnosis:    ICD-10-CM   1. MDD (major depressive disorder), recurrent episode, mild (HCC)  F33.0     2. GAD (generalized anxiety disorder)  F41.1     3. Attention deficit hyperactivity disorder (ADHD), predominantly inattentive type  F90.0 amphetamine-dextroamphetamine (ADDERALL) 20 MG tablet    escitalopram (LEXAPRO) 5 MG tablet    amphetamine-dextroamphetamine (ADDERALL) 20 MG tablet      Past Psychiatric History: Reviewed past psychiatric history from progress note on 10/13/2017.  Past trials of Zoloft, Prozac, Celexa, Wellbutrin, Adderall.  Past Medical History:  Past Medical History:  Diagnosis Date   ADHD (attention deficit hyperactivity disorder)    Depression    Goiter    Herpes genitalis    Obesity (BMI 30.0-34.9)    Pregnancy induced hypertension     Past Surgical History:  Procedure Laterality Date   BREAST ENHANCEMENT SURGERY Bilateral    CESAREAN SECTION N/A 05/29/2015   Procedure: CESAREAN SECTION;  Surgeon: [name], MD;  Location: [facility] ORS;  Service: Obstetrics;  Laterality: N/A;   CESAREAN SECTION N/A 06/25/2018   Procedure: CESAREAN SECTION, repeat;  Surgeon: [name], Elenora Fender, MD;  Location: [facility] OR[facility];  Service: Obstetrics;  Laterality: N/A;   DILATION AND CURETTAGE OF UTERUS     THYROID SURGERY     thyroid biopsy   WISDOM TOOTH EXTRACTION      Family Psychiatric History: Reviewed family  psychiatric history from progress note on 10/13/2017.  Family History:  Family History  Problem Relation Age of Onset   Diabetes type II Other    Lung cancer Other    Heart disease Other    Lung cancer Other    ADD / ADHD Father    Alcohol abuse Maternal Grandfather     Social History: Reviewed social history from progress note on  10/13/2017. 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  Other Topics Concern   Not on file  Social History Narrative   Not on file   Social Determinants of Health   Financial Resource Strain: Low Risk  (10/13/2017)   Overall Financial Resource Strain (CARDIA)    Difficulty of Paying Living Expenses: Not hard at all  Food Insecurity: No Food Insecurity (10/13/2017)   Hunger Vital Sign    Worried About Running Out of Food in the Last Year: Never true    Ran Out of Food in the Last Year: Never true  Transportation Needs: No Transportation Needs (10/13/2017)   PRAPARE - Administrator, Civil Service (Medical): No    Lack of Transportation (Non-Medical): No  Physical Activity: Inactive (10/13/2017)   Exercise Vital Sign    Days of Exercise per Week: 0 days    Minutes of Exercise per Session: 0 min  Stress: Not on file  Social Connections: Moderately Integrated (10/13/2017)   Social Connection and Isolation Panel [NHANES]    Frequency of Communication with Friends and Family: Three times a week    Frequency of Social Gatherings with Friends and Family: Once a week    Attends Religious Services: More than 4 times per year    Active Member of [employer] or Organizations: No    Attends  Banker Meetings: Never    Marital Status: Married    Allergies:  Allergies  Allergen Reactions   Iodine Swelling   Shrimp [Shellfish Allergy] Swelling    Metabolic Disorder Labs: No results found for: "HGBA1C", "MPG" No results found for: "PROLACTIN" No results found for: "CHOL", "TRIG", "HDL", "CHOLHDL", "VLDL", "LDLCALC" Lab Results  Component Value Date   TSH 0.676 08/20/2020   TSH 0.423 (L) 09/26/2017    Therapeutic Level Labs: No results found for: "LITHIUM" No results found for: "VALPROATE" No results found for: "CBMZ"  Current Medications: Current Outpatient Medications  Medication Sig Dispense Refill   escitalopram (LEXAPRO) 5 MG tablet Take 1 tablet (5 mg total) by mouth daily with breakfast. 30 tablet 1   valACYclovir (VALTREX) 500 MG tablet Take 1 tablet (500 mg total) by mouth 2 (two) times daily. 20 tablet 1  amphetamine-dextroamphetamine (ADDERALL) 20 MG tablet Take 1.5 tablets (30 mg total) by mouth daily. Take 1 tablet daily AM and half tablet daily after lunch 45 tablet 0   [START ON 06/05/2022] amphetamine-dextroamphetamine (ADDERALL) 20 MG tablet Take 1.5 tablets (30 mg total) by mouth daily. Take 1 tablet daily AM and half tablet daily after lunch 45 tablet 0   tretinoin (RETIN-A) 0.025 % cream SMARTSIG:sparingly Topical Every Evening (Patient not taking: Reported on 05/06/2022)     No current facility-administered medications for this visit.     Musculoskeletal: Strength & Muscle Tone:  UTA Gait & Station:  seated Patient leans: N/A  Psychiatric Specialty Exam: Review of Systems  Psychiatric/Behavioral:  Positive for dysphoric mood. The patient is nervous/anxious.   All other systems reviewed and are negative.   Patient came into the office yesterday and the following vital signs were obtained-blood pressure-129/97, pulse-85, temperature 98.8, weight-228.6 pounds.  General Appearance: Casual  Eye Contact:  Fair  Speech:  Normal  Rate  Volume:  Normal  Mood:  Anxious and Depressed  Affect:  Depressed  Thought Process:  Goal Directed and Descriptions of Associations: Intact  Orientation:  Full (Time, Place, and Person)  Thought Content: Logical   Suicidal Thoughts:  No  Homicidal Thoughts:  No  Memory:  Immediate;   Fair Recent;   Fair Remote;   Fair  Judgement:  Fair  Insight:  Fair  Psychomotor Activity:  Normal  Concentration:  Concentration: Fair and Attention Span: Fair  Recall:  [employer] of Knowledge: Fair  Language: Fair  Akathisia:  No  Handed:  Right  AIMS (if indicated): not done  Assets:  Communication Skills Desire for Improvement Housing Social Support  ADL's:  Intact  Cognition: WNL  Sleep:  Fair   Screenings: GAD-7    Flowsheet Row Video Visit from 05/06/2022 in [facility] Office Visit from 07/27/2021 in [facility] Video Visit from 04/21/2021 in [facility] Video Visit from 02/17/2021 in [facility]  Total GAD-7 Score 13 0 1 0      PHQ2-9    Flowsheet Row Video Visit from 05/06/2022 in [facility] Video Visit from 02/24/2022 in [facility] Video Visit from 11/25/2021 in [facility] Office Visit from 07/27/2021 in [facility] Office Visit from 06/09/2021 in [facility]  PHQ-2 Total Score 6 0 0 0 0  PHQ-9 Total Score 9 -- -- -- 0      Flowsheet Row Video Visit from 02/24/2022 in [facility] Video Visit from 11/25/2020 in [facility]  C-SSRS RISK CATEGORY No Risk No Risk        Assessment and Plan: Kimberly Knapp is a 36 year old Caucasian female who has a history of MDD, GAD, ADD was evaluated by telemedicine today.  Patient with worsening mood symptoms, interpersonal relationship  struggles, will benefit from medication management as well as psychotherapy.  Plan as noted below.  Plan MDD-unstable Start Lexapro 5 mg p.o. daily with breakfast Referred for CBT-provided community resources- [facility].   GAD-unstable Start Lexapro as noted above. Referral for CBT  ADHD-stable Adderall 30 mg p.o. daily in divided dosage. Reviewed [place] PMP aware  Patient had gotten confused about her appointment and came into the office yesterday-hence the following vital signs were taken including blood pressure-129/97, pulse 85, temperature 98.8, weight 228.6 pounds.  Since patient had trouble with getting a babysitter for  today this appointment was scheduled for a virtual visit.  Follow-up in clinic in 4 to 5 weeks or sooner if needed.   Collaboration of Care: Collaboration of Care: Referral or follow-up with counselor/therapist AEB encouraged to establish care with therapist.  Patient/Guardian was advised Release of Information must be obtained prior to any record release in order to collaborate their care with an outside provider. Patient/Guardian was advised if they have not already done so to contact the registration department to sign all necessary forms in order for [place] to release information regarding their care.   Consent: Patient/Guardian gives verbal consent for treatment and assignment of benefits for services provided during this visit. Patient/Guardian expressed understanding and agreed to proceed.   This note was generated in part or whole with voice recognition software. Voice recognition is usually quite accurate but there are transcription errors that can and very often do occur. I apologize for any typographical errors that were not detected and corrected.      Kimberly Longs, MD 05/06/2022, 12:47 PM

## 2022-05-29 ENCOUNTER — Other Ambulatory Visit: Payer: Self-pay | Admitting: Psychiatry

## 2022-05-29 DIAGNOSIS — F900 Attention-deficit hyperactivity disorder, predominantly inattentive type: Secondary | ICD-10-CM

## 2022-06-21 ENCOUNTER — Telehealth: Payer: No Typology Code available for payment source | Admitting: Psychiatry

## 2022-06-21 NOTE — Progress Notes (Signed)
Patient did not respond to calls by provider or contact by video although a link was sent out. Patient later left a message with front desk that she is sick.

## 2022-07-04 ENCOUNTER — Telehealth: Payer: Self-pay | Admitting: Psychiatry

## 2022-07-04 ENCOUNTER — Other Ambulatory Visit: Payer: Self-pay | Admitting: Psychiatry

## 2022-07-04 DIAGNOSIS — F900 Attention-deficit hyperactivity disorder, predominantly inattentive type: Secondary | ICD-10-CM

## 2022-07-04 MED ORDER — AMPHETAMINE-DEXTROAMPHETAMINE 20 MG PO TABS: 30.0000 mg | ORAL_TABLET | Freq: Every day | ORAL | 0 refills | Status: DC

## 2022-07-04 NOTE — Telephone Encounter (Signed)
Patient called to reschedule her appointment missed due to sickness. Scheduled for 07-21-22, virtual. Stated she is out of the Adderall please call in to the [facility] on [address].

## 2022-07-06 NOTE — Telephone Encounter (Signed)
Pt.notified

## 2022-07-21 ENCOUNTER — Encounter: Payer: Self-pay | Admitting: Psychiatry

## 2022-07-21 ENCOUNTER — Telehealth (INDEPENDENT_AMBULATORY_CARE_PROVIDER_SITE_OTHER): Payer: No Typology Code available for payment source | Admitting: Psychiatry

## 2022-07-21 DIAGNOSIS — F411 Generalized anxiety disorder: Secondary | ICD-10-CM | POA: Diagnosis not present

## 2022-07-21 DIAGNOSIS — F900 Attention-deficit hyperactivity disorder, predominantly inattentive type: Secondary | ICD-10-CM

## 2022-07-21 DIAGNOSIS — F3342 Major depressive disorder, recurrent, in full remission: Secondary | ICD-10-CM

## 2022-07-21 MED ORDER — AMPHETAMINE-DEXTROAMPHETAMINE 20 MG PO TABS: 30.0000 mg | ORAL_TABLET | Freq: Every day | ORAL | 0 refills | Status: DC

## 2022-07-21 NOTE — Progress Notes (Signed)
Virtual Visit via Video Note  I connected with [name] on 07/21/22 at  4:30 PM EST by a video enabled telemedicine application and verified that I am speaking with the correct person using two identifiers.  Location Provider Location : [facility] Patient Location : Home  Participants: Patient , Provider   I discussed the limitations of evaluation and management by telemedicine and the availability of in person appointments. The patient expressed understanding and agreed to proceed.    I discussed the assessment and treatment plan with the patient. The patient was provided an opportunity to ask questions and all were answered. The patient agreed with the plan and demonstrated an understanding of the instructions.   The patient was advised to call back or seek an in-person evaluation if the symptoms worsen or if the condition fails to improve as anticipated.   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  Patient today reports she is currently doing well with regards to her mood.  She reports she did not tolerate the Lexapro and hence she stopped taking it after a few days.  She reports she believes her mood symptoms are mostly situational and she is not clearly depressed otherwise.  Patient reports she continues to have relationship struggles with her spouse although they are working on their communication skills right now.  Reports she is not able to have family counseling at this time due to financial reasons.  She is also not interested in individual psychotherapy.  Patient reports sleep is overall good.  Patient reports attention and focus is  good on the Adderall.  Denies side effects.  Denies any appetite changes.  Denies any suicidality, homicidality or perceptual disturbances.  Patient denies any other concerns today.  Visit Diagnosis:    ICD-10-CM   1. MDD (major depressive disorder), recurrent, in full remission (HCC)  F33.42     2. GAD (generalized anxiety disorder)  F41.1     3. Attention deficit hyperactivity disorder (ADHD), predominantly inattentive type  F90.0 amphetamine-dextroamphetamine (ADDERALL) 20 MG tablet    amphetamine-dextroamphetamine (ADDERALL) 20 MG tablet      Past Psychiatric History: Reviewed past psychiatric history from progress note on 10/13/2017.  Past trials of Zoloft, Prozac, Celexa, Wellbutrin, Adderall.  Past Medical History:  Past Medical History:  Diagnosis Date   ADHD (attention deficit hyperactivity disorder)    Depression    Goiter    Herpes genitalis    Obesity (BMI 30.0-34.9)    Pregnancy induced hypertension     Past Surgical History:  Procedure Laterality Date   BREAST ENHANCEMENT SURGERY Bilateral    CESAREAN SECTION N/A 05/29/2015   Procedure: CESAREAN SECTION;  Surgeon: [name], MD;  Location: [facility] ORS;  Service: Obstetrics;  Laterality: N/A;   CESAREAN SECTION N/A 06/25/2018   Procedure: CESAREAN SECTION, repeat;  Surgeon: [name], Elenora Fender, MD;  Location: [facility] OR[facility];  Service: Obstetrics;  Laterality: N/A;   DILATION AND CURETTAGE OF UTERUS     THYROID SURGERY     thyroid biopsy   WISDOM TOOTH EXTRACTION      Family Psychiatric History: Reviewed family psychiatric history from  progress note on 10/13/2017.  Family History:  Family History  Problem Relation Age of Onset   Diabetes type II Other    Lung cancer Other    Heart disease Other    Lung cancer Other    ADD / ADHD Father    Alcohol abuse Maternal Grandfather     Social History: Reviewed social history from progress note on 10/13/2017. Social History   Socioeconomic History   Marital status:  Married    Spouse name: michael    Number of children: 3   Years of education: Not on file   Highest education level: Associate degree: occupational, Scientist, product/process development, or vocational program  Occupational History   Occupation: [employer]    Comment: not employed  Tobacco Use   Smoking status: Former    Types: Cigarettes    Quit date: 10/13/2017    Years since quitting: 4.7   Smokeless tobacco: Never  Vaping Use   Vaping Use: Never used  Substance and Sexual Activity   Alcohol use: Yes    Alcohol/week: 1.0 standard drink of alcohol    Types: 1 Glasses of wine per week    Comment: occasional    Drug use: No   Sexual activity: Yes    Partners: Male    Birth control/protection: None, I.U.D.  Other Topics Concern   Not on file  Social History Narrative   Not on file   Social Determinants of Health   Financial Resource Strain: Low Risk  (10/13/2017)   Overall Financial Resource Strain (CARDIA)    Difficulty of Paying Living Expenses: Not hard at all  Food Insecurity: No Food Insecurity (10/13/2017)   Hunger Vital Sign    Worried About Running Out of Food in the Last Year: Never true    Ran Out of Food in the Last Year: Never true  Transportation Needs: No Transportation Needs (10/13/2017)   PRAPARE - Administrator, Civil Service (Medical): No    Lack of Transportation (Non-Medical): No  Physical Activity: Inactive (10/13/2017)   Exercise Vital Sign    Days of Exercise per Week: 0 days    Minutes of Exercise per Session: 0 min  Stress: Not on file  Social Connections: Moderately Integrated (10/13/2017)   Social Connection and Isolation Panel [NHANES]    Frequency of Communication with Friends and Family: Three times a week    Frequency of Social Gatherings with Friends and Family: Once a week    Attends Religious Services: More than 4 times per year    Active Member of [employer] or Organizations: No    Attends Banker Meetings: Never    Marital Status: Married     Allergies:  Allergies  Allergen Reactions   Iodine Swelling   Shrimp [Shellfish Allergy] Swelling    Metabolic Disorder Labs: No results found for: "HGBA1C", "MPG" No results found for: "PROLACTIN" No results found for: "CHOL", "TRIG", "HDL", "CHOLHDL", "VLDL", "LDLCALC" Lab Results  Component Value Date   TSH 0.676 08/20/2020   TSH 0.423 (L) 09/26/2017    Therapeutic Level Labs: No results found for: "LITHIUM" No results found for: "VALPROATE" No results found for: "CBMZ"  Current Medications: Current Outpatient Medications  Medication Sig Dispense Refill   [START ON 08/02/2022] amphetamine-dextroamphetamine (ADDERALL) 20 MG tablet Take 1.5 tablets (30 mg total) by mouth daily. Take 1 tablet daily AM and half tablet daily after lunch 45 tablet 0   [START ON 08/31/2022] amphetamine-dextroamphetamine (ADDERALL) 20 MG tablet Take 1.5 tablets (30  mg total) by mouth daily. Take 1 tablet daily AM and half tablet daily after lunch 45 tablet 0   tretinoin (RETIN-A) 0.025 % cream SMARTSIG:sparingly Topical Every Evening (Patient not taking: Reported on 05/06/2022)     valACYclovir (VALTREX) 500 MG tablet Take 1 tablet (500 mg total) by mouth 2 (two) times daily. 20 tablet 1   No current facility-administered medications for this visit.     Musculoskeletal: Strength & Muscle Tone:  UTA Gait & Station:  Seated Patient leans: N/A  Psychiatric Specialty Exam: Review of Systems  Psychiatric/Behavioral: Negative.    All other systems reviewed and are negative.   There were no vitals taken for this visit.There is no height or weight on file to calculate BMI.  General Appearance: Casual  Eye Contact:  Fair  Speech:  Clear and Coherent  Volume:  Normal  Mood:  Euthymic  Affect:  Congruent  Thought Process:  Goal Directed and Descriptions of Associations: Intact  Orientation:  Full (Time, Place, and Person)  Thought Content: Logical   Suicidal Thoughts:  No  Homicidal  Thoughts:  No  Memory:  Immediate;   Fair Recent;   Fair Remote;   Fair  Judgement:  Fair  Insight:  Fair  Psychomotor Activity:  Normal  Concentration:  Concentration: Good and Attention Span: Good  Recall:  Good  Fund of Knowledge: Fair  Language: Fair  Akathisia:  No  Handed:  Right  AIMS (if indicated): not done  Assets:  Communication Skills Desire for Improvement Social Support  ADL's:  Intact  Cognition: WNL  Sleep:  Fair   Screenings: GAD-7    Flowsheet Row Video Visit from 07/21/2022 in [facility] Video Visit from 05/06/2022 in [facility] Office Visit from 07/27/2021 in [facility] Video Visit from 04/21/2021 in [facility] Video Visit from 02/17/2021 in [facility]  Total GAD-7 Score 1 13 0 1 0      PHQ2-9    Flowsheet Row Video Visit from 07/21/2022 in [facility] Video Visit from 05/06/2022 in [facility] Video Visit from 02/24/2022 in [facility] Video Visit from 11/25/2021 in [facility] Office Visit from 07/27/2021 in [facility]  PHQ-2 Total Score 0 6 0 0 0  PHQ-9 Total Score -- 9 -- -- --      Flowsheet Row Video Visit from 07/21/2022 in [facility] Video Visit from 02/24/2022 in [facility] Video Visit from 11/25/2020 in [facility]  C-SSRS RISK CATEGORY No Risk No Risk No Risk        Assessment and Plan: Kimberly Knapp is a 36 year old Caucasian female who has a history of MDD, GAD, ADD was evaluated by telemedicine today.  Patient is currently stable although she does have situational stressors, relationship struggles, may benefit from psychotherapy sessions.  Plan as noted  below.  Plan MDD-in remission Discontinue Lexapro for side effects. Referred for CBT-pending.  GAD-stable Referred for CBT-pending  ADHD-stable Adderall 30 mg p.o. daily in divided dosage Reviewed [place] PMP AWARxE  Advised patient to establish care with a family therapist or individual therapist as needed.  Follow-up in clinic in 2 months or sooner in person.  Collaboration of Care: Collaboration of Care: Referral or follow-up with counselor/therapist [facility] encouraged patient to establish care with therapist as needed.  Patient/Guardian was advised Release of Information must be obtained prior  to any record release in order to collaborate their care with an outside provider. Patient/Guardian was advised if they have not already done so to contact the registration department to sign all necessary forms in order for [place] to release information regarding their care.   Consent: Patient/Guardian gives verbal consent for treatment and assignment of benefits for services provided during this visit. Patient/Guardian expressed understanding and agreed to proceed.   This note was generated in part or whole with voice recognition software. Voice recognition is usually quite accurate but there are transcription errors that can and very often do occur. I apologize for any typographical errors that were not detected and corrected.      Jomarie Longs, MD 07/22/2022, 8:41 AM

## 2022-09-29 ENCOUNTER — Ambulatory Visit (INDEPENDENT_AMBULATORY_CARE_PROVIDER_SITE_OTHER): Payer: 59 | Admitting: Psychiatry

## 2022-09-29 ENCOUNTER — Encounter: Payer: Self-pay | Admitting: Psychiatry

## 2022-09-29 VITALS — BP 125/82 | HR 93 | Ht 67.0 in | Wt 233.0 lb

## 2022-09-29 DIAGNOSIS — Z630 Problems in relationship with spouse or partner: Secondary | ICD-10-CM | POA: Diagnosis not present

## 2022-09-29 DIAGNOSIS — F3342 Major depressive disorder, recurrent, in full remission: Secondary | ICD-10-CM

## 2022-09-29 DIAGNOSIS — F900 Attention-deficit hyperactivity disorder, predominantly inattentive type: Secondary | ICD-10-CM

## 2022-09-29 DIAGNOSIS — F411 Generalized anxiety disorder: Secondary | ICD-10-CM

## 2022-09-29 MED ORDER — AMPHETAMINE-DEXTROAMPHETAMINE 20 MG PO TABS: 30.0000 mg | ORAL_TABLET | Freq: Every day | ORAL | 0 refills | Status: DC

## 2022-09-29 NOTE — Progress Notes (Signed)
BH MD OP Progress Note  09/29/2022 11:06 AM Kimberly Knapp  MRN:  [id number]  Chief Complaint:  Chief Complaint  Patient presents with   Follow-up   Anxiety   ADHD   Medication Refill   HPI: Kimberly Knapp is a 37 year old female, married, lives in [place], has a history of ADHD, depression, anxiety was evaluated in office today.  Patient today reports overall she is doing well with regards to her ADHD symptoms.  She does have situational stressors, relationship struggles with her spouse, ongoing.  They still have their disagreements.  It is mostly about helping with children and the chores and activities.  Patient reports she feels overwhelmed often and feels she does not get enough help from her spouse.  There are periods when she feels she has a very good relationship with her spouse and other times when she reports she is angry at him for certain things he could be doing that she does not agree with when it comes to raising her children.  Patient reports they tried to go into family therapy however could not find someone who could fit into their schedule.  She is agreeable to try again.  Patient reports overall otherwise sleep and appetite is fair.  Denies any depression symptoms.  Denies any suicidality, homicidality or perceptual disturbances.  Currently compliant on medications.  Denies side effects to the Adderall.  Denies any other concerns today.  Visit Diagnosis:    ICD-10-CM   1. MDD (major depressive disorder), recurrent, in full remission (HCC)  F33.42     2. GAD (generalized anxiety disorder)  F41.1     3. Attention deficit hyperactivity disorder (ADHD), predominantly inattentive type  F90.0 amphetamine-dextroamphetamine (ADDERALL) 20 MG tablet    amphetamine-dextroamphetamine (ADDERALL) 20 MG tablet    amphetamine-dextroamphetamine (ADDERALL) 20 MG tablet    4. Partner relational problem  Z63.0       Past Psychiatric History: Reviewed past psychiatric  history from progress note on 10/13/2017.  Past trials of Zoloft, Prozac, Celexa, Wellbutrin, Adderall.  Past Medical History:  Past Medical History:  Diagnosis Date   ADHD (attention deficit hyperactivity disorder)    Depression    Goiter    Herpes genitalis    Obesity (BMI 30.0-34.9)    Pregnancy induced hypertension     Past Surgical History:  Procedure Laterality Date   BREAST ENHANCEMENT SURGERY Bilateral    CESAREAN SECTION N/A 05/29/2015   Procedure: CESAREAN SECTION;  Surgeon: [name], MD;  Location: [facility];  Service: Obstetrics;  Laterality: N/A;   CESAREAN SECTION N/A 06/25/2018   Procedure: CESAREAN SECTION, repeat;  Surgeon: [name], Elenora Fender, MD;  Location: [facility];  Service: Obstetrics;  Laterality: N/A;   DILATION AND CURETTAGE OF UTERUS     THYROID SURGERY     thyroid biopsy   WISDOM TOOTH EXTRACTION      Family Psychiatric History: Reviewed family psychiatric history from progress note on 10/13/2017.  Family History:  Family History  Problem Relation Age of Onset   Diabetes type II Other    Lung cancer Other    Heart disease Other    Lung cancer Other    ADD / ADHD Father    Alcohol abuse Maternal Grandfather     Social History: Reviewed social history from progress note on 10/13/2017. Social History   Socioeconomic History   Marital status: Married    Spouse name: michael    Number of children: 3   Years of education:  Not on file   Highest education level: Associate degree: occupational, technical, or vocational program  Occupational History   Occupation: [employer]    Comment: not employed  Tobacco Use   Smoking status: Former    Types: Cigarettes    Quit date: 10/13/2017    Years since quitting: 4.9   Smokeless tobacco: Never  Vaping Use   Vaping Use: Never used  Substance and Sexual Activity   Alcohol use: Yes    Alcohol/week: 1.0 standard drink of alcohol    Types: 1 Glasses of wine per week    Comment: occasional    Drug use: No    Sexual activity: Yes    Partners: Male    Birth control/protection: None, I.U.D.  Other Topics Concern   Not on file  Social History Narrative   Not on file   Social Determinants of Health   Financial Resource Strain: Low Risk  (10/13/2017)   Overall Financial Resource Strain (CARDIA)    Difficulty of Paying Living Expenses: Not hard at all  Food Insecurity: No Food Insecurity (10/13/2017)   Hunger Vital Sign    Worried About Running Out of Food in the Last Year: Never true    Ran Out of Food in the Last Year: Never true  Transportation Needs: No Transportation Needs (10/13/2017)   PRAPARE - Hydrologist (Medical): No    Lack of Transportation (Non-Medical): No  Physical Activity: Inactive (10/13/2017)   Exercise Vital Sign    Days of Exercise per Week: 0 days    Minutes of Exercise per Session: 0 min  Stress: Not on file  Social Connections: Moderately Integrated (10/13/2017)   Social Connection and Isolation Panel [NHANES]    Frequency of Communication with Friends and Family: Three times a week    Frequency of Social Gatherings with Friends and Family: Once a week    Attends Religious Services: More than 4 times per year    Active Member of [employer] or Organizations: No    Attends Archivist Meetings: Never    Marital Status: Married    Allergies:  Allergies  Allergen Reactions   Iodine Swelling   Shrimp [Shellfish Allergy] Swelling    Metabolic Disorder Labs: No results found for: "HGBA1C", "MPG" No results found for: "PROLACTIN" No results found for: "CHOL", "TRIG", "HDL", "CHOLHDL", "VLDL", "LDLCALC" Lab Results  Component Value Date   TSH 0.676 08/20/2020   TSH 0.423 (L) 09/26/2017    Therapeutic Level Labs: No results found for: "LITHIUM" No results found for: "VALPROATE" No results found for: "CBMZ"  Current Medications: Current Outpatient Medications  Medication Sig Dispense Refill   [START ON 11/28/2022]  amphetamine-dextroamphetamine (ADDERALL) 20 MG tablet Take 1.5 tablets (30 mg total) by mouth daily. Take 1 tablet daily AM and half tablet daily after lunch 45 tablet 0   tretinoin (RETIN-A) 0.025 % cream      valACYclovir (VALTREX) 500 MG tablet Take 1 tablet (500 mg total) by mouth 2 (two) times daily. 20 tablet 1   [START ON 10/01/2022] amphetamine-dextroamphetamine (ADDERALL) 20 MG tablet Take 1.5 tablets (30 mg total) by mouth daily. Take 1 tablet daily AM and half tablet daily after lunch 45 tablet 0   [START ON 10/30/2022] amphetamine-dextroamphetamine (ADDERALL) 20 MG tablet Take 1.5 tablets (30 mg total) by mouth daily. Take 1 tablet daily AM and half tablet daily after lunch 45 tablet 0   No current facility-administered medications for this visit.  Musculoskeletal: Strength & Muscle Tone: within normal limits Gait & Station: normal Patient leans: N/A  Psychiatric Specialty Exam: Review of Systems  Psychiatric/Behavioral: Negative.    All other systems reviewed and are negative.   Blood pressure 125/82, pulse 93, height 5\' 7"  (1.702 m), weight 233 lb (105.7 kg).Body mass index is 36.49 kg/m.  General Appearance: Casual  Eye Contact:  Fair  Speech:  Clear and Coherent  Volume:  Normal  Mood:  Euthymic  Affect:  Congruent  Thought Process:  Goal Directed and Descriptions of Associations: Intact  Orientation:  Full (Time, Place, and Person)  Thought Content: Logical   Suicidal Thoughts:  No  Homicidal Thoughts:  No  Memory:  Immediate;   Fair Recent;   Fair Remote;   Fair  Judgement:  Fair  Insight:  Fair  Psychomotor Activity:  Normal  Concentration:  Concentration: Fair and Attention Span: Fair  Recall:  [employer] of Knowledge: Fair  Language: Fair  Akathisia:  No  Handed:  Right  AIMS (if indicated): not done  Assets:  Communication Skills Desire for Improvement Housing Intimacy Talents/Skills Transportation  ADL's:  Intact  Cognition: WNL  Sleep:   Fair   Screenings: GAD-7    Personnel officer Visit from 09/29/2022 in [place] Video Visit from 07/21/2022 in [place] Video Visit from 05/06/2022 in [place] Office Visit from 07/27/2021 in [place] Video Visit from 04/21/2021 in [place]  Total GAD-7 Score 5 1 13  0 1      PHQ2-9    [place] Office Visit from 09/29/2022 in [place] Video Visit from 07/21/2022 in [place] Video Visit from 05/06/2022 in [place] Video Visit from 02/24/2022 in [place] Video Visit from 11/25/2021 in [place]  PHQ-2 Total Score 2 0 6 0 0  PHQ-9 Total Score 7 -- 9 -- --      [place] Office Visit from 09/29/2022 in [place] Video Visit from 07/21/2022 in [place] Video Visit from 02/24/2022 in [place] No Risk No Risk No Risk        Assessment and Plan: IRVIN LIZAMA is a 37 year old Caucasian female who has a history of MDD, GAD, ADD was evaluated in office today.  Patient currently going through relationship struggles with her spouse, will benefit from family therapy, individual therapy otherwise doing well on the medication for ADHD.  Plan as noted below.  Plan MDD in remission Will monitor closely  GAD-stable Continue to monitor.  Currently not on Lexapro.  ADHD-stable Adderall 30 mg p.o. daily in divided dosage Provided 3 prescriptions with date specified. Reviewed [place] PMP AWARxE   Partner relational  problem-unstable Patient referred for family therapy.  Provided resources in the community.  Patient also advised to establish care with individual therapist. Patient could communicate with staff here to get established with our therapist who is coming in.  Follow-up in clinic in 3 months or sooner if needed.    This note was generated in part or whole with voice recognition software. Voice recognition is usually quite accurate but there are transcription errors that can and very often do occur. I apologize  for any typographical errors that were not detected and corrected.     Jomarie Longs, MD 09/30/2022, 1:25 PM

## 2022-09-30 DIAGNOSIS — Z630 Problems in relationship with spouse or partner: Secondary | ICD-10-CM | POA: Insufficient documentation

## 2022-10-11 ENCOUNTER — Telehealth: Payer: Self-pay

## 2022-10-11 NOTE — Telephone Encounter (Signed)
Noted  

## 2022-10-11 NOTE — Telephone Encounter (Signed)
form was completed and faxed to [employer] - prior auth pending.

## 2022-10-11 NOTE — Telephone Encounter (Signed)
true rx sent form to complete along with a notice that a prior auth was needed for amphetamine/dextroamphetamine 20mg 

## 2022-12-29 ENCOUNTER — Telehealth: Payer: Self-pay | Admitting: Psychiatry

## 2022-12-29 DIAGNOSIS — F900 Attention-deficit hyperactivity disorder, predominantly inattentive type: Secondary | ICD-10-CM

## 2022-12-29 MED ORDER — AMPHETAMINE-DEXTROAMPHETAMINE 20 MG PO TABS: 30.0000 mg | ORAL_TABLET | Freq: Every day | ORAL | 0 refills | Status: DC

## 2022-12-29 NOTE — Telephone Encounter (Signed)
I have sent Adderall to [facility] in [place], [address]  Patient will need appointment scheduled if not already done.

## 2022-12-29 NOTE — Telephone Encounter (Signed)
Patient called requesting appointment and med refill. She is out of Adderall and pharmacy states there is not an active one on file. Please advise

## 2022-12-30 NOTE — Telephone Encounter (Signed)
Appointment scheduled for virtual 01/12/23

## 2023-01-12 ENCOUNTER — Encounter: Payer: Self-pay | Admitting: Psychiatry

## 2023-01-12 ENCOUNTER — Telehealth (INDEPENDENT_AMBULATORY_CARE_PROVIDER_SITE_OTHER): Payer: 59 | Admitting: Psychiatry

## 2023-01-12 DIAGNOSIS — F3342 Major depressive disorder, recurrent, in full remission: Secondary | ICD-10-CM | POA: Diagnosis not present

## 2023-01-12 DIAGNOSIS — F900 Attention-deficit hyperactivity disorder, predominantly inattentive type: Secondary | ICD-10-CM

## 2023-01-12 DIAGNOSIS — F411 Generalized anxiety disorder: Secondary | ICD-10-CM

## 2023-01-12 MED ORDER — AMPHETAMINE-DEXTROAMPHETAMINE 20 MG PO TABS: 30.0000 mg | ORAL_TABLET | Freq: Every day | ORAL | 0 refills | Status: DC

## 2023-01-12 MED ORDER — ESCITALOPRAM OXALATE 10 MG PO TABS: 10.0000 mg | ORAL_TABLET | Freq: Every day | ORAL | 0 refills | Status: DC

## 2023-01-12 NOTE — Progress Notes (Signed)
Virtual Visit via Video Note  I connected with Kimberly Knapp on 01/12/23 at 10:30 AM EDT by a video enabled telemedicine application and verified that I am speaking with the correct person using two identifiers.  Location Provider Location : [facility] Patient Location : Home  Participants: Patient , Provider   I discussed the limitations of evaluation and management by telemedicine and the availability of in person appointments. The patient expressed understanding and agreed to proceed.   I discussed the assessment and treatment plan with the patient. The patient was provided an opportunity to ask questions and all were answered. The patient agreed with the plan and demonstrated an understanding of the instructions.   The patient was advised to call back or seek an in-person evaluation if the symptoms worsen or if the condition fails to improve as anticipated.   BH MD OP Progress Note  01/13/2023 7:41 AM SONIKA GOSLIN  MRN:  [id number]  Chief Complaint:  Chief Complaint  Patient presents with   Follow-up   Anxiety   ADD   Medication Refill   HPI: Kimberly Knapp is a 37 year old female, married, lives in [place] has a history of ADHD, depression, anxiety was evaluated by telemedicine today.  Patient today reports she is currently struggling with a lot of situational stressors.  It is the busy time of the year for her children who are in school.  She is currently trying to juggle school activities, games and household activities.  She hence stays busy.  She reports it gets overwhelming on and off.  Her husband has been supportive.  They are in family counseling.  Taking it day by day.  Patient reports she restarted back on the Lexapro few weeks ago since she felt extremely anxious and felt she could not handle.  The Lexapro seems to help.  Denies side effects at this time.  Continues to struggle with her ability to manage and organize certain things throughout the day.  The ADHD  does have an impact on it.  She however is working with a therapist and that seems to be beneficial.  She is also taking the Adderall 30 mg daily in divided doses.  Denies side effects.  Reports sleep as good.  Reports appetite is fair.  Patient denies any suicidality, homicidality or perceptual disturbances.  Patient denies any other concerns today.  Visit Diagnosis:    ICD-10-CM   1. MDD (major depressive disorder), recurrent, in full remission (HCC)  F33.42 escitalopram (LEXAPRO) 10 MG tablet    2. GAD (generalized anxiety disorder)  F41.1 escitalopram (LEXAPRO) 10 MG tablet    3. Attention deficit hyperactivity disorder (ADHD), predominantly inattentive type  F90.0 amphetamine-dextroamphetamine (ADDERALL) 20 MG tablet    amphetamine-dextroamphetamine (ADDERALL) 20 MG tablet      Past Psychiatric History: I have reviewed past psychiatric history from progress note on 10/13/2017.  Past trials of Zoloft, Prozac, Celexa, Wellbutrin, Adderall.  Past Medical History:  Past Medical History:  Diagnosis Date   ADHD (attention deficit hyperactivity disorder)    Depression    Goiter    Herpes genitalis    Obesity (BMI 30.0-34.9)    Pregnancy induced hypertension     Past Surgical History:  Procedure Laterality Date   BREAST ENHANCEMENT SURGERY Bilateral    CESAREAN SECTION N/A 05/29/2015   Procedure: CESAREAN SECTION;  Surgeon: [name], MD;  Location: [facility] ORS;  Service: Obstetrics;  Laterality: N/A;   CESAREAN SECTION N/A 06/25/2018   Procedure: CESAREAN SECTION,  repeat;  Surgeon: [name], Elenora Fender, MD;  Location: [facility] ORS;  Service: Obstetrics;  Laterality: N/A;   DILATION AND CURETTAGE OF UTERUS     THYROID SURGERY     thyroid biopsy   WISDOM TOOTH EXTRACTION      Family Psychiatric History: Reviewed family psych history from progress note on 10/13/2017.  Family History:  Family History  Problem Relation Age of Onset   Diabetes type II Other    Lung cancer Other     Heart disease Other    Lung cancer Other    ADD / ADHD Father    Alcohol abuse Maternal Grandfather     Social History: Reviewed social history from progress note on 10/13/2017. Social History   Socioeconomic History   Marital status: Married    Spouse name: michael    Number of children: 3   Years of education: Not on file   Highest education level: Associate degree: occupational, Scientist, product/process development, or vocational program  Occupational History   Occupation: [employer]    Comment: not employed  Tobacco Use   Smoking status: Former    Types: Cigarettes    Quit date: 10/13/2017    Years since quitting: 5.2   Smokeless tobacco: Never  Vaping Use   Vaping Use: Never used  Substance and Sexual Activity   Alcohol use: Yes    Alcohol/week: 1.0 standard drink of alcohol    Types: 1 Glasses of wine per week    Comment: occasional    Drug use: No   Sexual activity: Yes    Partners: Male    Birth control/protection: None, I.U.D.  Other Topics Concern   Not on file  Social History Narrative   Not on file   Social Determinants of Health   Financial Resource Strain: Low Risk  (10/13/2017)   Overall Financial Resource Strain (CARDIA)    Difficulty of Paying Living Expenses: Not hard at all  Food Insecurity: No Food Insecurity (10/13/2017)   Hunger Vital Sign    Worried About Running Out of Food in the Last Year: Never true    Ran Out of Food in the Last Year: Never true  Transportation Needs: No Transportation Needs (10/13/2017)   PRAPARE - Administrator, Civil Service (Medical): No    Lack of Transportation (Non-Medical): No  Physical Activity: Inactive (10/13/2017)   Exercise Vital Sign    Days of Exercise per Week: 0 days    Minutes of Exercise per Session: 0 min  Stress: Not on file  Social Connections: Moderately Integrated (10/13/2017)   Social Connection and Isolation Panel [NHANES]    Frequency of Communication with Friends and Family: Three times a week    Frequency of  Social Gatherings with Friends and Family: Once a week    Attends Religious Services: More than 4 times per year    Active Member of [employer] or Organizations: No    Attends Banker Meetings: Never    Marital Status: Married    Allergies:  Allergies  Allergen Reactions   Iodine Swelling   Shrimp [Shellfish Allergy] Swelling    Metabolic Disorder Labs: No results found for: "HGBA1C", "MPG" No results found for: "PROLACTIN" No results found for: "CHOL", "TRIG", "HDL", "CHOLHDL", "VLDL", "LDLCALC" Lab Results  Component Value Date   TSH 0.676 08/20/2020   TSH 0.423 (L) 09/26/2017    Therapeutic Level Labs: No results found for: "LITHIUM" No results found for: "VALPROATE" No results found for: "CBMZ"  Current Medications: Current  Outpatient Medications  Medication Sig Dispense Refill   escitalopram (LEXAPRO) 10 MG tablet Take 1 tablet (10 mg total) by mouth daily. 90 tablet 0   amphetamine-dextroamphetamine (ADDERALL) 20 MG tablet Take 1.5 tablets (30 mg total) by mouth daily. Take 1 tablet daily AM and half tablet daily after lunch 45 tablet 0   [START ON 01/27/2023] amphetamine-dextroamphetamine (ADDERALL) 20 MG tablet Take 1.5 tablets (30 mg total) by mouth daily. Take 1 tablet daily AM and half tablet daily after lunch 45 tablet 0   [START ON 02/25/2023] amphetamine-dextroamphetamine (ADDERALL) 20 MG tablet Take 1.5 tablets (30 mg total) by mouth daily. Take 1 tablet daily AM and half tablet daily after lunch 45 tablet 0   tretinoin (RETIN-A) 0.025 % cream      valACYclovir (VALTREX) 500 MG tablet Take 1 tablet (500 mg total) by mouth 2 (two) times daily. 20 tablet 1   No current facility-administered medications for this visit.     Musculoskeletal: Strength & Muscle Tone:  UTA Gait & Station:  Seated Patient leans: N/A  Psychiatric Specialty Exam: Review of Systems  Psychiatric/Behavioral:  Positive for decreased concentration. The patient is  nervous/anxious.   All other systems reviewed and are negative.   There were no vitals taken for this visit.There is no height or weight on file to calculate BMI.  General Appearance: Casual  Eye Contact:  Fair  Speech:  Normal Rate  Volume:  Normal  Mood:  Anxious  Affect:  Appropriate  Thought Process:  Goal Directed and Descriptions of Associations: Intact  Orientation:  Full (Time, Place, and Person)  Thought Content: Logical   Suicidal Thoughts:  No  Homicidal Thoughts:  No  Memory:  Immediate;   Fair Recent;   Fair Remote;   Fair  Judgement:  Fair  Insight:  Good  Psychomotor Activity:  Normal  Concentration:  Concentration: Fair and Attention Span: Fair  Recall:  [employer] of Knowledge: Fair  Language: Fair  Akathisia:  No  Handed:  Right  AIMS (if indicated): not done  Assets:  Communication Skills Desire for Improvement Resilience Social Support  ADL's:  Intact  Cognition: WNL  Sleep:  Fair   Screenings: GAD-7    Flowsheet Row Video Visit from 01/12/2023 in [facility] Office Visit from 09/29/2022 in [facility] Video Visit from 07/21/2022 in [facility] Video Visit from 05/06/2022 in [facility] Office Visit from 07/27/2021 in [facility]  Total GAD-7 Score 3 5 1 13  0      PHQ2-9    Flowsheet Row Video Visit from 01/12/2023 in [facility] Office Visit from 09/29/2022 in [facility] Video Visit from 07/21/2022 in [facility] Video Visit from 05/06/2022 in [facility] Video Visit from 02/24/2022 in [facility]  PHQ-2 Total Score 0 2 0 6 0  PHQ-9 Total Score -- 7 -- 9  --      Flowsheet Row Video Visit from 01/12/2023 in [facility] Office Visit from 09/29/2022 in [facility] Video Visit from 07/21/2022 in [facility]  C-SSRS RISK CATEGORY No Risk No Risk No Risk        Assessment and Plan: LEIGHNA WORMAN is a 37 year old Caucasian  female who has a history of MDD, GAD, ADD was evaluated by telemedicine today.  Patient with multiple situational stressors, with recent worsening of anxiety currently back on Lexapro, will benefit from the following plan.  Plan MDD in remission Patient to continue CBT  GAD-unstable Restart Lexapro 10 mg daily Continue CBT with therapist-Ms.Clydie Braun on a weekly basis.  ADHD-improving Adderall 30 mg p.o. daily in divided dosage Provided 2 prescriptions with date specified. Continue CBT Reviewed [place] PMP AWARxE   Patient also encouraged to continue family counseling.  Follow-up in clinic in 2 months or sooner if needed.  Consent: Patient/Guardian gives verbal consent for treatment and assignment of benefits for services provided during this visit. Patient/Guardian expressed understanding and agreed to proceed.   This note was generated in part or whole with voice recognition software. Voice recognition is usually quite accurate but there are transcription errors that can and very often do occur. I apologize for any typographical errors that were not detected and corrected.     Jomarie Longs, MD 01/13/2023, 7:41 AM

## 2023-02-28 ENCOUNTER — Telehealth: Payer: Self-pay | Admitting: Psychiatry

## 2023-02-28 NOTE — Telephone Encounter (Signed)
Tried calling patient on 6/13 & 6/18 but the mailbox is full-mychart message sent  Provider is out of office and we are trying to reschedule

## 2023-03-06 ENCOUNTER — Telehealth: Payer: 59 | Admitting: Psychiatry

## 2023-03-27 ENCOUNTER — Telehealth: Payer: Self-pay | Admitting: Psychiatry

## 2023-03-27 DIAGNOSIS — F900 Attention-deficit hyperactivity disorder, predominantly inattentive type: Secondary | ICD-10-CM

## 2023-03-27 MED ORDER — AMPHETAMINE-DEXTROAMPHETAMINE 20 MG PO TABS: 30.0000 mg | ORAL_TABLET | Freq: Every day | ORAL | 0 refills | Status: DC

## 2023-03-27 NOTE — Telephone Encounter (Signed)
left message that rx was sent to the pharmacy  

## 2023-03-27 NOTE — Telephone Encounter (Signed)
I have [facility] Adderall XR [facility]

## 2023-03-27 NOTE — Telephone Encounter (Signed)
Patient called stating she needs refill on adderall medication. Patient's next appointment is August 20-please advise

## 2023-04-24 ENCOUNTER — Telehealth: Payer: Self-pay | Admitting: Psychiatry

## 2023-04-24 DIAGNOSIS — F900 Attention-deficit hyperactivity disorder, predominantly inattentive type: Secondary | ICD-10-CM

## 2023-04-24 MED ORDER — AMPHETAMINE-DEXTROAMPHETAMINE 20 MG PO TABS
30.0000 mg | ORAL_TABLET | Freq: Every day | ORAL | 0 refills | Status: DC
Start: 2023-04-24 — End: 2023-07-24

## 2023-04-24 NOTE — Telephone Encounter (Signed)
I have sent [facility] to [facility]

## 2023-04-24 NOTE — Telephone Encounter (Signed)
Patient called stating she needs a refill on Adderall 20 mg. Pharmacy states she does not have a refill. [facility] [address] [employer]

## 2023-05-02 ENCOUNTER — Encounter: Payer: Self-pay | Admitting: Psychiatry

## 2023-05-02 ENCOUNTER — Telehealth (INDEPENDENT_AMBULATORY_CARE_PROVIDER_SITE_OTHER): Payer: 59 | Admitting: Psychiatry

## 2023-05-02 DIAGNOSIS — F411 Generalized anxiety disorder: Secondary | ICD-10-CM

## 2023-05-02 DIAGNOSIS — F3342 Major depressive disorder, recurrent, in full remission: Secondary | ICD-10-CM | POA: Diagnosis not present

## 2023-05-02 DIAGNOSIS — F900 Attention-deficit hyperactivity disorder, predominantly inattentive type: Secondary | ICD-10-CM

## 2023-05-02 MED ORDER — AMPHETAMINE-DEXTROAMPHETAMINE 20 MG PO TABS
30.0000 mg | ORAL_TABLET | Freq: Every day | ORAL | 0 refills | Status: DC
Start: 2023-06-21 — End: 2023-09-20

## 2023-05-02 MED ORDER — ESCITALOPRAM OXALATE 10 MG PO TABS
10.0000 mg | ORAL_TABLET | Freq: Every day | ORAL | 0 refills | Status: DC
Start: 2023-05-02 — End: 2023-07-31

## 2023-05-02 MED ORDER — AMPHETAMINE-DEXTROAMPHETAMINE 20 MG PO TABS
30.0000 mg | ORAL_TABLET | Freq: Every day | ORAL | 0 refills | Status: DC
Start: 2023-05-23 — End: 2023-07-31

## 2023-05-02 NOTE — Progress Notes (Signed)
Virtual Visit via Video Note  I connected with Kimberly Knapp on 05/02/23 at 11:30 AM EDT by a video enabled telemedicine application and verified that I am speaking with the correct person using two identifiers.  Location Provider Location : [facility] Patient Location : Home  Participants: Patient , Provider   I discussed the limitations of evaluation and management by telemedicine and the availability of in person appointments. The patient expressed understanding and agreed to proceed.   I discussed the assessment and treatment plan with the patient. The patient was provided an opportunity to ask questions and all were answered. The patient agreed with the plan and demonstrated an understanding of the instructions.   The patient was advised to call back or seek an in-person evaluation if the symptoms worsen or if the condition fails to improve as anticipated.   BH MD OP Progress Note  05/02/2023 11:55 AM Kimberly Knapp  MRN:  [id number]  Chief Complaint:  Chief Complaint  Patient presents with   Follow-up   Anxiety   ADD   Medication Refill   HPI: Kimberly Knapp is a 37 year old female, married, lives in [place], has a history of ADHD, depression, anxiety was evaluated by telemedicine today.  Patient today reports she is currently doing fairly well with regards to her mood symptoms.  Denies any significant anxiety.  Reports she feels motivated to stay active and is able to take care of her children's needs.  She reports she lost the fall season and looks forward to doing all the fun activities.  Patient reports she does struggle with her weight.  She does not have a good diet schedule since she stays home and stays active throughout the day.  She tends to eat more at the end of the day.  Patient reports however she is trying to make some changes.  She is also planning on going to the gym at least 3 times a week once her child goes to preschool.  She does have a friend who may be  able to go with her.  Patient reports sleep is good.  Patient reports her relationship struggles with her spouse is improving.  They were able to take family counseling for a month.  Due to her husband's schedule they were not able to continue it.  She reports if she needs to go back for counseling she is planning to do so.  She denies any suicidality, homicidality or perceptual disturbances.  Patient denies any other concerns today.  Visit Diagnosis:    ICD-10-CM   1. MDD (major depressive disorder), recurrent, in full remission (HCC)  F33.42 escitalopram (LEXAPRO) 10 MG tablet    2. GAD (generalized anxiety disorder)  F41.1 escitalopram (LEXAPRO) 10 MG tablet    3. Attention deficit hyperactivity disorder (ADHD), predominantly inattentive type  F90.0 amphetamine-dextroamphetamine (ADDERALL) 20 MG tablet    amphetamine-dextroamphetamine (ADDERALL) 20 MG tablet      Past Psychiatric History: I have reviewed past psychiatric history from progress note on 10/13/2017.  Past trials of Zoloft, Prozac, Celexa, Wellbutrin, Adderall.  Past Medical History:  Past Medical History:  Diagnosis Date   ADHD (attention deficit hyperactivity disorder)    Depression    Goiter    Herpes genitalis    Obesity (BMI 30.0-34.9)    Pregnancy induced hypertension     Past Surgical History:  Procedure Laterality Date   BREAST ENHANCEMENT SURGERY Bilateral    CESAREAN SECTION N/A 05/29/2015   Procedure: CESAREAN SECTION;  Surgeon: Leeroy Bock  C [name], MD;  Location: [facility];  Service: Obstetrics;  Laterality: N/A;   CESAREAN SECTION N/A 06/25/2018   Procedure: CESAREAN SECTION, repeat;  Surgeon: [name], Elenora Fender, MD;  Location: [facility];  Service: Obstetrics;  Laterality: N/A;   DILATION AND CURETTAGE OF UTERUS     THYROID SURGERY     thyroid biopsy   WISDOM TOOTH EXTRACTION      Family Psychiatric History: I have reviewed family psychiatric history from progress note on 10/13/2017.  Family History:   Family History  Problem Relation Age of Onset   Diabetes type II Other    Lung cancer Other    Heart disease Other    Lung cancer Other    ADD / ADHD Father    Alcohol abuse Maternal Grandfather     Social History: I have reviewed social history from progress note on 10/13/2017. Social History   Socioeconomic History   Marital status: Married    Spouse name: michael    Number of children: 3   Years of education: Not on file   Highest education level: Associate degree: occupational, Scientist, product/process development, or vocational program  Occupational History   Occupation: [employer]    Comment: not employed  Tobacco Use   Smoking status: Former    Current packs/day: 0.00    Types: Cigarettes    Quit date: 10/13/2017    Years since quitting: 5.5   Smokeless tobacco: Never  Vaping Use   Vaping status: Never Used  Substance and Sexual Activity   Alcohol use: Yes    Alcohol/week: 1.0 standard drink of alcohol    Types: 1 Glasses of wine per week    Comment: occasional    Drug use: No   Sexual activity: Yes    Partners: Male    Birth control/protection: None, I.U.D.  Other Topics Concern   Not on file  Social History Narrative   Not on file   Social Determinants of Health   Financial Resource Strain: Low Risk  (10/13/2017)   Overall Financial Resource Strain (CARDIA)    Difficulty of Paying Living Expenses: Not hard at all  Food Insecurity: No Food Insecurity (10/13/2017)   Hunger Vital Sign    Worried About Running Out of Food in the Last Year: Never true    Ran Out of Food in the Last Year: Never true  Transportation Needs: No Transportation Needs (10/13/2017)   PRAPARE - Administrator, Civil Service (Medical): No    Lack of Transportation (Non-Medical): No  Physical Activity: Inactive (10/13/2017)   Exercise Vital Sign    Days of Exercise per Week: 0 days    Minutes of Exercise per Session: 0 min  Stress: Not on file  Social Connections: Moderately Integrated (10/13/2017)    Social Connection and Isolation Panel [NHANES]    Frequency of Communication with Friends and Family: Three times a week    Frequency of Social Gatherings with Friends and Family: Once a week    Attends Religious Services: More than 4 times per year    Active Member of [employer] or Organizations: No    Attends Banker Meetings: Never    Marital Status: Married    Allergies:  Allergies  Allergen Reactions   Iodine Swelling   Shrimp [Shellfish Allergy] Swelling    Metabolic Disorder Labs: No results found for: "HGBA1C", "MPG" No results found for: "PROLACTIN" No results found for: "CHOL", "TRIG", "HDL", "CHOLHDL", "VLDL", "LDLCALC" Lab Results  Component Value Date  TSH 0.676 08/20/2020   TSH 0.423 (L) 09/26/2017    Therapeutic Level Labs: No results found for: "LITHIUM" No results found for: "VALPROATE" No results found for: "CBMZ"  Current Medications: Current Outpatient Medications  Medication Sig Dispense Refill   amphetamine-dextroamphetamine (ADDERALL) 20 MG tablet Take 1.5 tablets (30 mg total) by mouth daily. Take 1 tablet daily AM and half tablet daily after lunch 45 tablet 0   tretinoin (RETIN-A) 0.025 % cream      valACYclovir (VALTREX) 500 MG tablet Take 1 tablet (500 mg total) by mouth 2 (two) times daily. 20 tablet 1   [START ON 05/23/2023] amphetamine-dextroamphetamine (ADDERALL) 20 MG tablet Take 1.5 tablets (30 mg total) by mouth daily. Take 1 tablet daily AM and half tablet daily after lunch 45 tablet 0   [START ON 06/21/2023] amphetamine-dextroamphetamine (ADDERALL) 20 MG tablet Take 1.5 tablets (30 mg total) by mouth daily. Take 1 tablet daily AM and half tablet daily after lunch 45 tablet 0   escitalopram (LEXAPRO) 10 MG tablet Take 1 tablet (10 mg total) by mouth daily. 90 tablet 0   No current facility-administered medications for this visit.     Musculoskeletal: Strength & Muscle Tone:  UTA Gait & Station: normal Patient leans:  N/A  Psychiatric Specialty Exam: Review of Systems  Psychiatric/Behavioral: Negative.      There were no vitals taken for this visit.There is no height or weight on file to calculate BMI.  General Appearance: Fairly Groomed  Eye Contact:  Fair  Speech:  Clear and Coherent  Volume:  Normal  Mood:  Euthymic  Affect:  Congruent  Thought Process:  Goal Directed and Descriptions of Associations: Intact  Orientation:  Full (Time, Place, and Person)  Thought Content: Logical   Suicidal Thoughts:  No  Homicidal Thoughts:  No  Memory:  Immediate;   Fair Recent;   Fair Remote;   Fair  Judgement:  Fair  Insight:  Fair  Psychomotor Activity:  Normal  Concentration:  Concentration: Fair and Attention Span: Fair  Recall:  [employer] of Knowledge: Fair  Language: Fair  Akathisia:  No  Handed:  Right  AIMS (if indicated): not done  Assets:  Communication Skills Desire for Improvement Housing Intimacy Social Support  ADL's:  Intact  Cognition: WNL  Sleep:  Fair   Screenings: GAD-7    Flowsheet Row Video Visit from 01/12/2023 in [facility] Office Visit from 09/29/2022 in [facility] Video Visit from 07/21/2022 in [facility] Video Visit from 05/06/2022 in [facility] Office Visit from 07/27/2021 in [facility]  Total GAD-7 Score 3 5 1 13  0      PHQ2-9    Flowsheet Row Video Visit from 05/02/2023 in [facility] Video Visit from 01/12/2023 in [facility] Office Visit from 09/29/2022 in [facility] Video Visit from 07/21/2022 in [facility] Video Visit from 05/06/2022 in [facility]  PHQ-2  Total Score 0 0 2 0 6  PHQ-9 Total Score -- -- 7 -- 9      Flowsheet Row Video Visit from 05/02/2023 in [facility] Video Visit from 01/12/2023 in [facility] Office Visit from 09/29/2022 in [facility]  C-SSRS RISK CATEGORY No  Risk No Risk No Risk        Assessment and Plan: Kimberly Knapp is a 37 year old Caucasian female who has a history of MDD, GAD, ADD was evaluated by telemedicine today.  Patient is currently stable.  Plan as noted below.  Plan MDD in remission Patient to continue CBT as needed   GAD-improving Lexapro 10 mg p.o. daily  ADHD-stable Adderall 30 mg p.o. daily divided dosage Provided 2 prescriptions with date specified Reviewed [place] PMP AWARxE Continue CBT as needed  Follow-up in clinic in 3 months or sooner in office.     Collaboration of Care: Collaboration of Care: Referral or follow-up with counselor/therapist AEB patient continue CBT as needed.  Patient/Guardian was advised Release of Information must be obtained prior to any record release in order to collaborate their care with an outside provider. Patient/Guardian was advised if they have not already done so to contact the registration department to sign all necessary forms in order for [place] to release information regarding their care.   Consent: Patient/Guardian gives verbal consent for treatment and assignment of benefits for services provided during this visit. Patient/Guardian expressed understanding and agreed to proceed.   This note was generated in part or whole with voice recognition software. Voice recognition is usually quite accurate but there are transcription errors that can and very often do occur. I apologize for any typographical errors that were not detected and corrected.    Jomarie Longs, MD 05/02/2023, 11:55 AM

## 2023-07-24 ENCOUNTER — Telehealth: Payer: Self-pay | Admitting: Psychiatry

## 2023-07-24 DIAGNOSIS — F900 Attention-deficit hyperactivity disorder, predominantly inattentive type: Secondary | ICD-10-CM

## 2023-07-24 MED ORDER — AMPHETAMINE-DEXTROAMPHETAMINE 20 MG PO TABS
30.0000 mg | ORAL_TABLET | Freq: Every day | ORAL | 0 refills | Status: DC
Start: 2023-07-24 — End: 2024-02-07

## 2023-07-24 NOTE — Telephone Encounter (Signed)
Pt.notified

## 2023-07-24 NOTE — Telephone Encounter (Signed)
Patient states she has no active prescriptions on her account per the pharmacy. [facility] [address]

## 2023-07-24 NOTE — Telephone Encounter (Signed)
I have sent Adderall to pharmacy. ?

## 2023-07-31 ENCOUNTER — Encounter: Payer: Self-pay | Admitting: Psychiatry

## 2023-07-31 ENCOUNTER — Ambulatory Visit (INDEPENDENT_AMBULATORY_CARE_PROVIDER_SITE_OTHER): Payer: 59 | Admitting: Psychiatry

## 2023-07-31 VITALS — BP 128/84 | HR 96 | Temp 98.1°F | Ht 67.0 in | Wt 230.0 lb

## 2023-07-31 DIAGNOSIS — F411 Generalized anxiety disorder: Secondary | ICD-10-CM

## 2023-07-31 DIAGNOSIS — F331 Major depressive disorder, recurrent, moderate: Secondary | ICD-10-CM

## 2023-07-31 DIAGNOSIS — F900 Attention-deficit hyperactivity disorder, predominantly inattentive type: Secondary | ICD-10-CM | POA: Diagnosis not present

## 2023-07-31 MED ORDER — AMPHETAMINE-DEXTROAMPHETAMINE 20 MG PO TABS
30.0000 mg | ORAL_TABLET | Freq: Every day | ORAL | 0 refills | Status: DC
Start: 2023-08-22 — End: 2023-10-23

## 2023-07-31 MED ORDER — ESCITALOPRAM OXALATE 10 MG PO TABS
10.0000 mg | ORAL_TABLET | Freq: Every day | ORAL | Status: DC
Start: 2023-07-31 — End: 2023-09-20

## 2023-07-31 NOTE — Progress Notes (Unsigned)
BH MD OP Progress Note  07/31/2023 5:36 PM Kimberly Knapp  MRN:  [id number]  Chief Complaint:  Chief Complaint  Patient presents with   Follow-up   Anxiety   Depression   ADD   Medication Refill   HPI: Kimberly Knapp is a 37 year old female, married, lives in [place], has a history of ADHD, depression, anxiety was evaluated in office today.  Patient today reports she is currently in a lot of pain.  She struggles with back pain.  Lately she has been going through a lot of flareups.  The past few days she is struggling with radiation of pain down to her legs.  She does have difficulty functioning throughout the day, as well as sleeping due to the pain.  Patient reports she will reach out to her primary care provider.  Currently trying to establish care with a chiropractor.  Patient today burst out in tears during the session due to the pain.  Patient reports she also feels tired and fatigued during the day even after she sleeps at night.  Patient is currently not taking the Lexapro however would like to go back on it.  She decided to stop taking it few weeks ago.  She felt she did not need it.  She is currently compliant on the Adderall.  Denies side effects.  Patient denies any suicidality, homicidality or perceptual disturbances.  Patient denies any other concerns today.  Visit Diagnosis:    ICD-10-CM   1. MDD (major depressive disorder), recurrent episode, moderate (HCC)  F33.1 escitalopram (LEXAPRO) 10 MG tablet    2. GAD (generalized anxiety disorder)  F41.1 escitalopram (LEXAPRO) 10 MG tablet    3. Attention deficit hyperactivity disorder (ADHD), predominantly inattentive type  F90.0 amphetamine-dextroamphetamine (ADDERALL) 20 MG tablet      Past Psychiatric History: I have reviewed past psychiatric history from progress note on 10/13/2017.  Past trials of Zoloft, Prozac, Celexa, Wellbutrin, Adderall.  Past Medical History:  Past Medical History:  Diagnosis Date    ADHD (attention deficit hyperactivity disorder)    Depression    Goiter    Herpes genitalis    Obesity (BMI 30.0-34.9)    Pregnancy induced hypertension     Past Surgical History:  Procedure Laterality Date   BREAST ENHANCEMENT SURGERY Bilateral    CESAREAN SECTION N/A 05/29/2015   Procedure: CESAREAN SECTION;  Surgeon: [name], MD;  Location: [facility] ORS;  Service: Obstetrics;  Laterality: N/A;   CESAREAN SECTION N/A 06/25/2018   Procedure: CESAREAN SECTION, repeat;  Surgeon: [name], Elenora Fender, MD;  Location: [facility] ORS;  Service: Obstetrics;  Laterality: N/A;   DILATION AND CURETTAGE OF UTERUS     THYROID SURGERY     thyroid biopsy   WISDOM TOOTH EXTRACTION      Family Psychiatric History: I have reviewed family psychiatric history from progress note on 10/13/2017.  Family History:  Family History  Problem Relation Age of Onset   Diabetes type II Other    Lung cancer Other    Heart disease Other    Lung cancer Other    ADD / ADHD Father    Alcohol abuse Maternal Grandfather     Social History: I have reviewed social history from progress note on 10/13/2017. Social History   Socioeconomic History   Marital status: Married    Spouse name: michael    Number of children: 3   Years of education: Not on file   Highest education level: Associate degree: occupational, Scientist, product/process development, or  vocational program  Occupational History   Occupation: [employer]    Comment: not employed  Tobacco Use   Smoking status: Former    Current packs/day: 0.00    Types: Cigarettes    Quit date: 10/13/2017    Years since quitting: 5.8   Smokeless tobacco: Never  Vaping Use   Vaping status: Never Used  Substance and Sexual Activity   Alcohol use: Yes    Alcohol/week: 1.0 standard drink of alcohol    Types: 1 Glasses of wine per week    Comment: occasional    Drug use: No   Sexual activity: Yes    Partners: Male    Birth control/protection: None, I.U.D.  Other Topics Concern   Not on file   Social History Narrative   Not on file   Social Determinants of Health   Financial Resource Strain: Low Risk  (10/13/2017)   Overall Financial Resource Strain (CARDIA)    Difficulty of Paying Living Expenses: Not hard at all  Food Insecurity: No Food Insecurity (10/13/2017)   Hunger Vital Sign    Worried About Running Out of Food in the Last Year: Never true    Ran Out of Food in the Last Year: Never true  Transportation Needs: No Transportation Needs (10/13/2017)   PRAPARE - Administrator, Civil Service (Medical): No    Lack of Transportation (Non-Medical): No  Physical Activity: Inactive (10/13/2017)   Exercise Vital Sign    Days of Exercise per Week: 0 days    Minutes of Exercise per Session: 0 min  Stress: Not on file  Social Connections: Moderately Integrated (10/13/2017)   Social Connection and Isolation Panel [NHANES]    Frequency of Communication with Friends and Family: Three times a week    Frequency of Social Gatherings with Friends and Family: Once a week    Attends Religious Services: More than 4 times per year    Active Member of [employer] or Organizations: No    Attends Banker Meetings: Never    Marital Status: Married    Allergies:  Allergies  Allergen Reactions   Iodine Swelling   Shrimp [Shellfish Allergy] Swelling    Metabolic Disorder Labs: No results found for: "HGBA1C", "MPG" No results found for: "PROLACTIN" No results found for: "CHOL", "TRIG", "HDL", "CHOLHDL", "VLDL", "LDLCALC" Lab Results  Component Value Date   TSH 0.676 08/20/2020   TSH 0.423 (L) 09/26/2017    Therapeutic Level Labs: No results found for: "LITHIUM" No results found for: "VALPROATE" No results found for: "CBMZ"  Current Medications: Current Outpatient Medications  Medication Sig Dispense Refill   amphetamine-dextroamphetamine (ADDERALL) 20 MG tablet Take 1.5 tablets (30 mg total) by mouth daily. Take 1 tablet daily AM and half tablet daily after lunch  45 tablet 0   Azelastine HCl 137 MCG/SPRAY SOLN Place 2 sprays into both nostrils 2 (two) times daily.     cyclobenzaprine (FLEXERIL) 10 MG tablet Take 10 mg by mouth 3 (three) times daily as needed for muscle spasms.     tretinoin (RETIN-A) 0.025 % cream      valACYclovir (VALTREX) 500 MG tablet Take 1 tablet (500 mg total) by mouth 2 (two) times daily. 20 tablet 1   amphetamine-dextroamphetamine (ADDERALL) 20 MG tablet Take 1.5 tablets (30 mg total) by mouth daily. Take 1 tablet daily AM and half tablet daily after lunch 45 tablet 0   [START ON 08/22/2023] amphetamine-dextroamphetamine (ADDERALL) 20 MG tablet Take 1.5 tablets (30 mg total) by  mouth daily. Take 1 tablet daily AM and half tablet daily after lunch 135 tablet 0   escitalopram (LEXAPRO) 10 MG tablet Take 1 tablet (10 mg total) by mouth daily. HAS SUPPLIES     No current facility-administered medications for this visit.     Musculoskeletal: Strength & Muscle Tone: within normal limits Gait & Station: normal Patient leans: N/A  Psychiatric Specialty Exam: Review of Systems  Musculoskeletal:  Positive for back pain.  Psychiatric/Behavioral:  Positive for decreased concentration, dysphoric mood and sleep disturbance. The patient is nervous/anxious.     Blood pressure 128/84, pulse 96, temperature 98.1 F (36.7 C), temperature source Skin, height 5\' 7"  (1.702 m), weight 230 lb (104.3 kg).Body mass index is 36.02 kg/m.  General Appearance: Fairly Groomed  Eye Contact:  Fair  Speech:  Normal Rate  Volume:  Normal  Mood:  Anxious and Depressed  Affect:  Tearful  Thought Process:  Goal Directed and Descriptions of Associations: Intact  Orientation:  Full (Time, Place, and Person)  Thought Content: Logical   Suicidal Thoughts:  No  Homicidal Thoughts:  No  Memory:  Immediate;   Fair Recent;   Fair Remote;   Fair  Judgement:  Fair  Insight:  Fair  Psychomotor Activity:  Normal  Concentration:  Concentration: Fair and  Attention Span: Fair  Recall:  [employer] of Knowledge: Fair  Language: Fair  Akathisia:  No  Handed:  Right  AIMS (if indicated): not done  Assets:  Desire for Improvement  ADL's:  Intact  Cognition: WNL  Sleep:  Poor   Screenings: GAD-7    Flowsheet Row Office Visit from 07/31/2023 in [facility] Video Visit from 01/12/2023 in [facility] Office Visit from 09/29/2022 in [facility] Video Visit from 07/21/2022 in [facility] Video Visit from 05/06/2022 in [facility]  Total GAD-7 Score 13 3 5 1 13       PHQ2-9    Flowsheet Row Office Visit from 07/31/2023 in [facility] Video Visit from 05/02/2023 in [facility] Video Visit from 01/12/2023 in [facility] Office Visit from 09/29/2022 in [facility] Video Visit from 07/21/2022 in [facility]  PHQ-2 Total Score 2 0 0 2 0  PHQ-9 Total Score 9 -- -- 7 --      Flowsheet Row Office Visit from 07/31/2023 in [facility] Video Visit from 05/02/2023 in [facility] Video Visit from 01/12/2023 in [facility]  C-SSRS RISK CATEGORY No Risk No Risk No Risk        Assessment and Plan: Kimberly Knapp is a 37 year old Caucasian female who has a history of MDD, GAD, ADD was evaluated in office today.  Patient is currently struggling with worsening mood symptoms, as well as pain which does have an effect on her mood and sleep, plan as noted below.  Plan MDD-unstable Restart Lexapro 10 mg p.o. daily   GAD-unstable Restart Lexapro 10  mg p.o. daily  ADHD-unstable likely due to pain Continue Adderall 30 mg p.o. daily for now. Reviewed [place] PMP AWARxE    Collaboration of Care: Collaboration of Care: Other patient advised to follow up with primary care provider for management of her current pain.  Patient/Guardian was  advised Release of Information must be obtained prior to any record release in order to collaborate their care with an outside provider. Patient/Guardian was advised if they have not already done so to contact the registration department to sign all necessary forms in order for [place] to release information regarding their care.   Consent: Patient/Guardian gives verbal consent for treatment and assignment of benefits for services provided during this visit. Patient/Guardian expressed understanding and agreed to proceed.   Follow-up in clinic in 4 to 8 weeks or sooner if needed.  This note was generated in part or whole with voice recognition software. Voice recognition is usually quite accurate but there are transcription errors that can and very often do occur. I apologize for any typographical errors that were not detected and corrected.    Jomarie Longs, MD 07/31/2023, 5:36 PM

## 2023-09-11 ENCOUNTER — Other Ambulatory Visit: Payer: Self-pay

## 2023-09-11 ENCOUNTER — Inpatient Hospital Stay
Admission: RE | Admit: 2023-09-11 | Discharge: 2023-09-11 | Disposition: A | Discharge status: Home / Self Care | Payer: Self-pay | Source: Ambulatory Visit | Attending: Orthopedic Surgery | Admitting: Orthopedic Surgery

## 2023-09-11 DIAGNOSIS — Z049 Encounter for examination and observation for unspecified reason: Secondary | ICD-10-CM

## 2023-09-15 ENCOUNTER — Telehealth: Payer: 59 | Admitting: Psychiatry

## 2023-09-15 NOTE — Progress Notes (Signed)
 Referring Physician:  Glover Lenis, MD [phone] [place],  [place] [address]  Primary Physician:  Kimberly Public, MD (Inactive)  History of Present Illness: 09/20/2023 [name] has a history of ADHD and obesity.    Saw ortho at [facility] on 08/16/23 for back pain. Has seen [facility] as well. History of intermittent LBP x years. Was told she had DDD.   She had constant LBP and right lateral leg pain to her foot that started a week prior to thanksgiving. Initial pain was so bad that she had trouble walking.   Her pain has improved. She has no current LBP or leg pain. No numbness, tingling, or weakness.   She is taking mobic  now. She stopped the neurontin  and flexeril  as she was not hurting. She did not start PT- was worried it would aggravate her pain.   Bowel/Bladder Dysfunction: none  She quit smoking in 2019.   Conservative measures:  Physical therapy: has not participated in PT Multimodal medical therapy including regular antiinflammatories: Cyclobenzaprine , Gabapentin , Ibuprofen , Meloxicam , Methocarbamol, tramadol , prednisone Injections: no epidural steroid injections  Past Surgery: none  Kimberly Knapp has no symptoms of cervical myelopathy.  The symptoms are causing a significant impact on the patient's life.   Review of Systems:  A 10 point review of systems is negative, except for the pertinent positives and negatives detailed in the HPI.  Past Medical History: Past Medical History:  Diagnosis Date   ADHD (attention deficit hyperactivity disorder)    Depression    Goiter    Herpes genitalis    Obesity (BMI 30.0-34.9)    Pregnancy induced hypertension     Past Surgical History: Past Surgical History:  Procedure Laterality Date   BREAST ENHANCEMENT SURGERY Bilateral    CESAREAN SECTION N/A 05/29/2015   Procedure: CESAREAN SECTION;  Surgeon: Kimberly BROCKS [name], MD;  Location: [facility];  Service: Obstetrics;  Laterality: N/A;   CESAREAN SECTION N/A  06/25/2018   Procedure: CESAREAN SECTION, repeat;  Surgeon: Knapp, Kimberly BROCKS, MD;  Location: [facility] OR[facility];  Service: Obstetrics;  Laterality: N/A;   DILATION AND CURETTAGE OF UTERUS     THYROID  SURGERY     thyroid  biopsy   WISDOM TOOTH EXTRACTION      Allergies: Allergies as of 09/20/2023 - Review Complete 09/20/2023  Allergen Reaction Noted   Iodine Swelling 05/27/2015   Shrimp [shellfish allergy] Swelling 05/27/2015    Medications: Outpatient Encounter Medications as of 09/20/2023  Medication Sig   amphetamine -dextroamphetamine  (ADDERALL) 20 MG tablet Take 1.5 tablets (30 mg total) by mouth daily. Take 1 tablet daily AM and half tablet daily after lunch   amphetamine -dextroamphetamine  (ADDERALL) 20 MG tablet Take 1.5 tablets (30 mg total) by mouth daily. Take 1 tablet daily AM and half tablet daily after lunch   Azelastine HCl 137 MCG/SPRAY SOLN Place 2 sprays into both nostrils 2 (two) times daily.   celecoxib (CELEBREX) 200 MG capsule Take 1 capsule by mouth daily.   meloxicam  (MOBIC ) 7.5 MG tablet Take 7.5 mg by mouth daily.   tretinoin (RETIN-A) 0.025 % cream    valACYclovir  (VALTREX ) 500 MG tablet Take 1 tablet (500 mg total) by mouth 2 (two) times daily.   [DISCONTINUED] amphetamine -dextroamphetamine  (ADDERALL) 20 MG tablet Take 1.5 tablets (30 mg total) by mouth daily. Take 1 tablet daily AM and half tablet daily after lunch   [DISCONTINUED] cyclobenzaprine  (FLEXERIL ) 10 MG tablet Take 10 mg by mouth 3 (three) times daily as needed for muscle spasms.   [  DISCONTINUED] escitalopram  (LEXAPRO ) 10 MG tablet Take 1 tablet (10 mg total) by mouth daily. HAS SUPPLIES (Patient not taking: Reported on 09/20/2023)   No facility-administered encounter medications on file as of 09/20/2023.    Social History: Social History   Tobacco Use   Smoking status: Former    Current packs/day: 0.00    Types: Cigarettes    Quit date: 10/13/2017    Years since quitting: 5.9   Smokeless tobacco: Never   Vaping Use   Vaping status: Never Used  Substance Use Topics   Alcohol  use: Yes    Alcohol /week: 1.0 standard drink of alcohol     Types: 1 Glasses of wine per week    Comment: occasional    Drug use: No    Family Medical History: Family History  Problem Relation Age of Onset   Diabetes type II Other    Lung cancer Other    Heart disease Other    Lung cancer Other    ADD / ADHD Father    Alcohol  abuse Maternal Grandfather     Physical Examination: Vitals:   09/20/23 0915  BP: 134/86    General: Patient is well developed, well nourished, calm, collected, and in no apparent distress. Attention to examination is appropriate.  Respiratory: Patient is breathing without any difficulty.   NEUROLOGICAL:     Awake, alert, oriented to person, place, and time.  Speech is clear and fluent. Fund of knowledge is appropriate.   Cranial Nerves: Pupils equal round and reactive to light.  Facial tone is symmetric.    No abnormal lesions on exposed skin.   Strength: Side Biceps Triceps Deltoid Interossei Grip Wrist Ext. Wrist Flex.  R 5 5 5 5 5 5 5   L 5 5 5 5 5 5 5    Side Iliopsoas Quads Hamstring PF DF EHL  R 5 5 5 5 5 5   L 5 5 5 5 5 5    Reflexes are 2+ and symmetric at the biceps, brachioradialis, patella and achilles.   Hoffman's is absent.  Clonus is not present.   Bilateral upper and lower extremity sensation is intact to light touch.     She can toe stand on right and left leg independently. She can heel stand.   Gait is normal.     Medical Decision Making  Imaging: Lumbar MRI dated 08/07/23:     I have personally reviewed the images and agree with the above interpretation.  Assessment and Plan: Ms. [name] had constant LBP and right lateral leg pain to her foot that started a week prior to thanksgiving. Initial pain was so bad that she had trouble walking.   Her pain has since improved. She has no current LBP or leg pain. No numbness, tingling, or weakness.    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   No weakness on her exam.   Treatment options discussed with patient and following plan made:   - Agree with holding on lumbar ESI as she not having pain.  - Continue on prn mobic . Reviewed dosing and side effects. Take with food.  - Agree with stopping neurontin  and flexeril .  - Discussed PT at length. Will hold for now as she is just feeling better. Will revisit at her follow up.  - If pain returns, she will call.  - Red  flag symptoms reviewed.  - Follow up with me in 6 weeks and prn.   I spent a total of 35 minutes in face-to-face and non-face-to-face activities related to this patient's care today including review of outside records, review of imaging, review of symptoms, physical exam, discussion of differential diagnosis, discussion of treatment options, and documentation.   Thank you for involving me in the care of this patient.   Glade Boys PA-C Dept. of Neurosurgery

## 2023-09-20 ENCOUNTER — Ambulatory Visit: Payer: Managed Care, Other (non HMO) | Admitting: Orthopedic Surgery

## 2023-09-20 ENCOUNTER — Encounter: Payer: Self-pay | Admitting: Orthopedic Surgery

## 2023-09-20 VITALS — BP 134/86 | Ht 67.0 in | Wt 231.0 lb

## 2023-09-20 DIAGNOSIS — M5126 Other intervertebral disc displacement, lumbar region: Secondary | ICD-10-CM

## 2023-09-20 DIAGNOSIS — M51369 Other intervertebral disc degeneration, lumbar region without mention of lumbar back pain or lower extremity pain: Secondary | ICD-10-CM

## 2023-09-20 DIAGNOSIS — M48061 Spinal stenosis, lumbar region without neurogenic claudication: Secondary | ICD-10-CM | POA: Diagnosis not present

## 2023-09-20 DIAGNOSIS — M47816 Spondylosis without myelopathy or radiculopathy, lumbar region: Secondary | ICD-10-CM

## 2023-09-20 NOTE — Patient Instructions (Signed)
 It was so nice to see you today. Thank you so much for coming in.    You have some wear and tear in your lower back along with disc herniation at L4-L5. This is likely what was causing your pain.   I agree with holding on PT for now. May revisit at your follow up.   You can continue with meloxicam  to help with pain and inflammation. Take as directed with food.   I agree with stopping gabapentin  and flexeril  since you are not hurting.   If pain comes back, then let me know and we can revisit other treatment options.   Hope you have fun in the snow this weekend!!!!!  I will see you back in 6 weeks. Please do not hesitate to call if you have any questions or concerns. You can also message me in MyChart.   Glade Boys PA-C [phone]     The physicians and staff at [facility] Neurosurgery at [facility] are committed to providing excellent care. You may receive a survey asking for feedback about your experience at our office. We value you your feedback and appreciate you taking the time to to fill it out. The [facility] leadership team is also available to discuss your experience in person, feel free to contact us  [phone].

## 2023-10-06 ENCOUNTER — Telehealth: Payer: Self-pay | Admitting: Orthopedic Surgery

## 2023-10-06 MED ORDER — TRAMADOL HCL 50 MG PO TABS: 50.0000 mg | ORAL_TABLET | Freq: Three times a day (TID) | ORAL | 0 refills | Status: DC | PRN

## 2023-10-06 NOTE — Telephone Encounter (Signed)
Patient was last seen 1.8.25. she ws not in very much pain at the appointment but she has called the office today stating she is now having a lot of pain. The pain is not radiating down to her legs but it is hard for her to walk/move. She wanted to move her appointment up that she had to follow up with Stacy on 2.19.25. Patient will now be seen 01.27.25.  Patient also states she is about to run out of tramadol; wondering if we can have that sent to the pharmacy

## 2023-10-06 NOTE — Telephone Encounter (Signed)
Patient is seeing you Monday. She got tramadol from Urgent care in 07/2023 and has been taking Meloxicam. I called her and explained that our office does not usually refill medications that come from outside our office. I advised her that if her pain is unmanageable over the weekend to go to the ER. Regardless I said I would pass on her message.

## 2023-10-06 NOTE — Progress Notes (Deleted)
Referring Physician:  No referring provider defined for this encounter.  Primary Physician:  Kimberly Pardon, MD (Inactive)  History of Present Illness: 10/06/2023 [name] has a history of ADHD and obesity.    Last seen by me on 09/20/23 for LBP and right lateral leg pain that was improving. 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   She was to continue mobic. We discussed injections and PT- she declined as pain was improving.   She called on Friday with increased pain and she is here for follow up.            Saw ortho at [facility] on 08/16/23 for back pain. Has seen [facility] as well. History of intermittent LBP x years. Was told she had DDD.   She had constant LBP and right lateral leg pain to her foot that started a week prior to thanksgiving. Initial pain was so bad that she had trouble walking.   Her pain has improved. She has no current LBP or leg pain. No numbness, tingling, or weakness.   She is taking mobic now. She stopped the neurontin and flexeril as she was not hurting. She did not start PT- was worried it would aggravate her pain.   Bowel/Bladder Dysfunction: none  She quit smoking in 2019.   Conservative measures:  Physical therapy: has not participated in PT Multimodal medical therapy including regular antiinflammatories: Cyclobenzaprine, Gabapentin, Ibuprofen, Meloxicam, Methocarbamol, tramadol, prednisone Injections: no epidural steroid injections  Past Surgery: none  Kimberly Knapp has no symptoms of cervical myelopathy.  The symptoms are causing a significant impact on the patient's life.   Review of Systems:  A 10 point review of systems is negative, except for the pertinent positives and negatives detailed in the HPI.  Past Medical  History: Past Medical History:  Diagnosis Date   ADHD (attention deficit hyperactivity disorder)    Depression    Goiter    Herpes genitalis    Obesity (BMI 30.0-34.9)    Pregnancy induced hypertension     Past Surgical History: Past Surgical History:  Procedure Laterality Date   BREAST ENHANCEMENT SURGERY Bilateral    CESAREAN SECTION N/A 05/29/2015   Procedure: CESAREAN SECTION;  Surgeon: [name], MD;  Location: [facility] ORS;  Service: Obstetrics;  Laterality: N/A;   CESAREAN SECTION N/A 06/25/2018   Procedure: CESAREAN SECTION, repeat;  Surgeon: [name], Elenora Fender, MD;  Location: [facility] ORS;  Service: Obstetrics;  Laterality: N/A;   DILATION AND CURETTAGE OF UTERUS     THYROID SURGERY     thyroid biopsy   WISDOM TOOTH EXTRACTION      Allergies: Allergies as of 10/09/2023 - Review Complete 09/20/2023  Allergen Reaction Noted   Iodine Swelling 05/27/2015   Shrimp [shellfish allergy] Swelling 05/27/2015    Medications: Outpatient Encounter Medications as of 10/09/2023  Medication Sig   amphetamine-dextroamphetamine (ADDERALL) 20 MG tablet Take 1.5 tablets (30 mg total) by mouth daily. Take 1 tablet daily AM and half tablet daily after lunch   amphetamine-dextroamphetamine (ADDERALL) 20 MG tablet Take 1.5 tablets (30 mg total) by mouth daily. Take 1 tablet daily AM and half tablet daily after lunch   Azelastine HCl 137 MCG/SPRAY SOLN Place 2 sprays into both nostrils 2 (two) times daily.   celecoxib (CELEBREX) 200 MG capsule Take 1 capsule  by mouth daily.   meloxicam (MOBIC) 7.5 MG tablet Take 7.5 mg by mouth daily.   tretinoin (RETIN-A) 0.025 % cream    valACYclovir (VALTREX) 500 MG tablet Take 1 tablet (500 mg total) by mouth 2 (two) times daily.   No facility-administered encounter medications on file as of 10/09/2023.    Social History: Social History   Tobacco Use   Smoking status: Former    Current packs/day: 0.00    Types: Cigarettes    Quit date: 10/13/2017     Years since quitting: 5.9   Smokeless tobacco: Never  Vaping Use   Vaping status: Never Used  Substance Use Topics   Alcohol use: Yes    Alcohol/week: 1.0 standard drink of alcohol    Types: 1 Glasses of wine per week    Comment: occasional    Drug use: No    Family Medical History: Family History  Problem Relation Age of Onset   Diabetes type II Other    Lung cancer Other    Heart disease Other    Lung cancer Other    ADD / ADHD Father    Alcohol abuse Maternal Grandfather     Physical Examination: There were no vitals filed for this visit.    Awake, alert, oriented to person, place, and time.  Speech is clear and fluent. Fund of knowledge is appropriate.   Cranial Nerves: Pupils equal round and reactive to light.  Facial tone is symmetric.    No abnormal lesions on exposed skin.   Strength: Side Biceps Triceps Deltoid Interossei Grip Wrist Ext. Wrist Flex.  R 5 5 5 5 5 5 5   L 5 5 5 5 5 5 5    Side Iliopsoas Quads Hamstring PF DF EHL  R 5 5 5 5 5 5   L 5 5 5 5 5 5    Reflexes are 2+ and symmetric at the biceps, brachioradialis, patella and achilles.   Hoffman's is absent.  Clonus is not present.   Bilateral upper and lower extremity sensation is intact to light touch.     She can toe stand on right and left leg independently. She can heel stand.   Gait is normal.     Medical Decision Making  Imaging: None   Assessment and Plan: [name] had constant LBP and right lateral leg pain to her foot that started a week prior to thanksgiving. Initial pain was so bad that she had trouble walking.   Her pain has since improved. She has no current LBP or leg pain. No numbness, tingling, or weakness.   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   No weakness on her exam.    Treatment options discussed with patient and following plan made:   - Agree with holding on lumbar ESI as she not having pain.  - Continue on prn mobic. Reviewed dosing and side effects. Take with food.  - Agree with stopping neurontin and flexeril.  - Discussed PT at length. Will hold for now as she is just feeling better. Will revisit at her follow up.  - If pain returns, she will call.  - Red flag symptoms reviewed.  - Follow up with me in 6 weeks and prn.   I spent a total of 35 minutes in face-to-face and non-face-to-face activities related to this patient's  care today including review of outside records, review of imaging, review of symptoms, physical exam, discussion of differential diagnosis, discussion of treatment options, and documentation.   Thank you for involving me in the care of this patient.   Drake Leach PA-C Dept. of Neurosurgery

## 2023-10-06 NOTE — Telephone Encounter (Signed)
I called patient back to inform her that Stacy prescribed Tramadol and she will be able to pick it up at the pharmacy.

## 2023-10-06 NOTE — Telephone Encounter (Signed)
She has a large disc herniation. Okay for a limited prescription of ultram, but this is not something we will prescribe long term. PMP reviewed and is appropriate.   Let her know I sent it to her pharmacy.   She has f/u with me on Monday.

## 2023-10-09 ENCOUNTER — Telehealth: Payer: Self-pay | Admitting: Orthopedic Surgery

## 2023-10-09 ENCOUNTER — Ambulatory Visit: Payer: Managed Care, Other (non HMO) | Admitting: Neurosurgery

## 2023-10-09 ENCOUNTER — Ambulatory Visit: Payer: Managed Care, Other (non HMO) | Admitting: Orthopedic Surgery

## 2023-10-09 NOTE — Telephone Encounter (Signed)
Just wanted to check, she was moved to Dr. Michaelle Copas schedule today with Kennyth Arnold being out. Wondering if she rescheduled just because Kennyth Arnold is out or if patient canceled for another reason?

## 2023-10-09 NOTE — Telephone Encounter (Signed)
Patient was supposed to see Kennyth Arnold today and is rescheduled for 10/16/2023. She is requesting a prednisone taper to help with her pain until she sees [place]. She states that she is not having any pain while sitting, but when getting up and moving around her pain ranges from 3-5/10 depending on what she is doing.   [facility] on [address]

## 2023-10-09 NOTE — Telephone Encounter (Signed)
Oh okay. Just wanted to make sure she was aware there was another option. Thanks.

## 2023-10-13 NOTE — Progress Notes (Deleted)
 Referring Physician:  No referring provider defined for this encounter.  Primary Physician:  Loura Pardon, MD (Inactive)  History of Present Illness: 10/13/2023 [name] has a history of ADHD and obesity.    Last seen by me on 09/20/23 for LBP and right lateral leg pain that was improving. 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   She was to continue mobic. We discussed injections and PT- she declined as pain was improving.   She called with increased pain and she is here for follow up.            Saw ortho at [facility] on 08/16/23 for back pain. Has seen [facility] as well. History of intermittent LBP x years. Was told she had DDD.   She had constant LBP and right lateral leg pain to her foot that started a week prior to thanksgiving. Initial pain was so bad that she had trouble walking.   Her pain has improved. She has no current LBP or leg pain. No numbness, tingling, or weakness.   She is taking mobic now. She stopped the neurontin and flexeril as she was not hurting. She did not start PT- was worried it would aggravate her pain.   Bowel/Bladder Dysfunction: none  She quit smoking in 2019.   Conservative measures:  Physical therapy: has not participated in PT Multimodal medical therapy including regular antiinflammatories: Cyclobenzaprine, Gabapentin, Ibuprofen, Meloxicam, Methocarbamol, tramadol, prednisone Injections: no epidural steroid injections  Past Surgery: none  Kimberly Knapp has no symptoms of cervical myelopathy.  The symptoms are causing a significant impact on the patient's life.   Review of Systems:  A 10 point review of systems is negative, except for the pertinent positives and negatives detailed in the HPI.  Past Medical History: Past Medical  History:  Diagnosis Date   ADHD (attention deficit hyperactivity disorder)    Depression    Goiter    Herpes genitalis    Obesity (BMI 30.0-34.9)    Pregnancy induced hypertension     Past Surgical History: Past Surgical History:  Procedure Laterality Date   BREAST ENHANCEMENT SURGERY Bilateral    CESAREAN SECTION N/A 05/29/2015   Procedure: CESAREAN SECTION;  Surgeon: [name], MD;  Location: [facility];  Service: Obstetrics;  Laterality: N/A;   CESAREAN SECTION N/A 06/25/2018   Procedure: CESAREAN SECTION, repeat;  Surgeon: [name], Elenora Fender, MD;  Location: [facility];  Service: Obstetrics;  Laterality: N/A;   DILATION AND CURETTAGE OF UTERUS     THYROID SURGERY     thyroid biopsy   WISDOM TOOTH EXTRACTION      Allergies: Allergies as of 10/16/2023 - Review Complete 10/06/2023  Allergen Reaction Noted   Iodine Swelling 05/27/2015   Shrimp [shellfish allergy] Swelling 05/27/2015    Medications: Outpatient Encounter Medications as of 10/16/2023  Medication Sig   amphetamine-dextroamphetamine (ADDERALL) 20 MG tablet Take 1.5 tablets (30 mg total) by mouth daily. Take 1 tablet daily AM and half tablet daily after lunch   amphetamine-dextroamphetamine (ADDERALL) 20 MG tablet Take 1.5 tablets (30 mg total) by mouth daily. Take 1 tablet daily AM and half tablet daily after lunch   Azelastine HCl 137 MCG/SPRAY SOLN Place 2 sprays into both nostrils 2 (two) times daily.   celecoxib (CELEBREX) 200 MG capsule Take 1 capsule by mouth  daily.   meloxicam (MOBIC) 7.5 MG tablet Take 7.5 mg by mouth daily.   traMADol (ULTRAM) 50 MG tablet Take 1 tablet (50 mg total) by mouth every 8 (eight) hours as needed for severe pain (pain score 7-10).   tretinoin (RETIN-A) 0.025 % cream    valACYclovir (VALTREX) 500 MG tablet Take 1 tablet (500 mg total) by mouth 2 (two) times daily.   No facility-administered encounter medications on file as of 10/16/2023.    Social History: Social History    Tobacco Use   Smoking status: Former    Current packs/day: 0.00    Types: Cigarettes    Quit date: 10/13/2017    Years since quitting: 6.0   Smokeless tobacco: Never  Vaping Use   Vaping status: Never Used  Substance Use Topics   Alcohol use: Yes    Alcohol/week: 1.0 standard drink of alcohol    Types: 1 Glasses of wine per week    Comment: occasional    Drug use: No    Family Medical History: Family History  Problem Relation Age of Onset   Diabetes type II Other    Lung cancer Other    Heart disease Other    Lung cancer Other    ADD / ADHD Father    Alcohol abuse Maternal Grandfather     Physical Examination: There were no vitals filed for this visit.    Awake, alert, oriented to person, place, and time.  Speech is clear and fluent. Fund of knowledge is appropriate.   Cranial Nerves: Pupils equal round and reactive to light.  Facial tone is symmetric.    No abnormal lesions on exposed skin.   Strength: Side Biceps Triceps Deltoid Interossei Grip Wrist Ext. Wrist Flex.  R 5 5 5 5 5 5 5   L 5 5 5 5 5 5 5    Side Iliopsoas Quads Hamstring PF DF EHL  R 5 5 5 5 5 5   L 5 5 5 5 5 5    Reflexes are 2+ and symmetric at the biceps, brachioradialis, patella and achilles.   Hoffman's is absent.  Clonus is not present.   Bilateral upper and lower extremity sensation is intact to light touch.     She can toe stand on right and left leg independently. She can heel stand.   Gait is normal.     Medical Decision Making  Imaging: None   Assessment and Plan: [name] had constant LBP and right lateral leg pain to her foot that started a week prior to thanksgiving. Initial pain was so bad that she had trouble walking.   Her pain has since improved. She has no current LBP or leg pain. No numbness, tingling, or weakness.   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 Also with large disc extrusion at L4-L5 with  cranial migration, moderate central stenosis, and severe right lateral recess stenosis.   No weakness on her exam.   Treatment options discussed with patient and following plan made:   - Agree with holding on lumbar ESI as she not having pain.  - Continue on prn mobic. Reviewed dosing and side effects. Take with food.  - Agree with stopping neurontin and flexeril.  - Discussed PT at length. Will hold for now as she is just feeling better. Will revisit at her follow up.  - If pain returns, she will call.  - Red flag symptoms reviewed.  - Follow up  with me in 6 weeks and prn.   I spent a total of 35 minutes in face-to-face and non-face-to-face activities related to this patient's care today including review of outside records, review of imaging, review of symptoms, physical exam, discussion of differential diagnosis, discussion of treatment options, and documentation.   Thank you for involving me in the care of this patient.   Drake Leach PA-C Dept. of Neurosurgery

## 2023-10-16 ENCOUNTER — Ambulatory Visit: Payer: Managed Care, Other (non HMO) | Admitting: Orthopedic Surgery

## 2023-10-23 ENCOUNTER — Encounter: Payer: Self-pay | Admitting: Psychiatry

## 2023-10-23 ENCOUNTER — Telehealth (INDEPENDENT_AMBULATORY_CARE_PROVIDER_SITE_OTHER): Payer: 59 | Admitting: Psychiatry

## 2023-10-23 DIAGNOSIS — F331 Major depressive disorder, recurrent, moderate: Secondary | ICD-10-CM | POA: Diagnosis not present

## 2023-10-23 DIAGNOSIS — F411 Generalized anxiety disorder: Secondary | ICD-10-CM

## 2023-10-23 DIAGNOSIS — F900 Attention-deficit hyperactivity disorder, predominantly inattentive type: Secondary | ICD-10-CM | POA: Diagnosis not present

## 2023-10-23 MED ORDER — AMPHETAMINE-DEXTROAMPHETAMINE 20 MG PO TABS: 30.0000 mg | ORAL_TABLET | Freq: Every day | ORAL | 0 refills | Status: DC

## 2023-10-23 NOTE — Progress Notes (Signed)
 Virtual Visit via Video Note  I connected with Kimberly Knapp on 10/23/23 at  4:00 PM EST by a video enabled telemedicine application and verified that I am speaking with the correct person using two identifiers.  Location Provider Location : [facility] Patient Location : Home  Participants: Patient , Provider   I discussed the limitations of evaluation and management by telemedicine and the availability of in person appointments. The patient expressed understanding and agreed to proceed.   I discussed the assessment and treatment plan with the patient. The patient was provided an opportunity to ask questions and all were answered. The patient agreed with the plan and demonstrated an understanding of the instructions.   The patient was advised to call back or seek an in-person evaluation if the symptoms worsen or if the condition fails to improve as anticipated.  BH MD OP Progress Note  10/23/2023 4:19 PM Kimberly Knapp  MRN:  [id number]  Chief Complaint:  Chief Complaint  Patient presents with   Follow-up   Anxiety   ADD   Medication Refill   HPI: Kimberly Knapp is a 38 year old female, married, lives in [place], has a history of ADHD, depression, anxiety was evaluated by telemedicine today.  The patient presents for follow-up regarding major depression, generalized anxiety disorder, and ADHD.  She has a history of major depression and was previously prescribed Lexapro  but did not start it due to potential interactions with other medications like tramadol  for back pain. Her mood and anxiety are described as 'as normal as it can be.' No depression symptoms or thoughts of self-harm. She sleeps well and has a good appetite.  She has generalized anxiety disorder and describes her mood and anxiety as stable. No significant changes in her anxiety symptoms.  Regarding ADHD, she feels limited in some activities due to her back condition, which has been a source of aggravation. She is  currently taking Adderall and denies any side effects.  In November, she experienced significant back pain, leading her to seek urgent care. She was prescribed tramadol , muscle relaxers, and Neurontin . An MRI revealed a disc extrusion in her back. Her pain lasted about two weekS. She is not in significant pain now but experiences some aches and pains.  She is currently much better.   Visit Diagnosis:  1. MDD (major depressive disorder), recurrent episode, moderate (HCC)  F33.1      2. GAD (generalized anxiety disorder)  F41.1 escitalopram  (LEXAPRO ) 10 MG tablet     3. Attention deficit hyperactivity disorder (ADHD), predominantly inattentive type  F90.0 amphetamine -dextroamphetamine  (ADDERALL) 20 MG tablet       Past Psychiatric History: I have reviewed past psychiatric history from progress note on 10/13/2017.  Past trials of Zoloft, Prozac, Celexa, Wellbutrin, Adderall.  Past Medical History:  Past Medical History:  Diagnosis Date   ADHD (attention deficit hyperactivity disorder)    Depression    Goiter    Herpes genitalis    Obesity (BMI 30.0-34.9)    Pregnancy induced hypertension     Past Surgical History:  Procedure Laterality Date   BREAST ENHANCEMENT SURGERY Bilateral    CESAREAN SECTION N/A 05/29/2015   Procedure: CESAREAN SECTION;  Surgeon: Margarie Shay [name], MD;  Location: [facility] ORS;  Service: Obstetrics;  Laterality: N/A;   CESAREAN SECTION N/A 06/25/2018   Procedure: CESAREAN SECTION, repeat;  Surgeon: [name], Margarie Shay, MD;  Location: [facility] ORS;  Service: Obstetrics;  Laterality: N/A;   DILATION AND CURETTAGE OF UTERUS  THYROID  SURGERY     thyroid  biopsy   WISDOM TOOTH EXTRACTION      Family Psychiatric History: I have reviewed family psychiatric history from progress note on 10/13/2017.  Family History:  Family History  Problem Relation Age of Onset   Diabetes type II Other    Lung cancer Other    Heart disease Other    Lung cancer Other    ADD / ADHD Father     Alcohol abuse Maternal Grandfather     Social History: I have reviewed social history from progress note on 10/13/2017. Social History   Socioeconomic History   Marital status: Married    Spouse name: michael    Number of children: 3   Years of education: Not on file   Highest education level: Associate degree: occupational, Scientist, product/process development, or vocational program  Occupational History   Occupation: [employer]    Comment: not employed  Tobacco Use   Smoking status: Former    Current packs/day: 0.00    Types: Cigarettes    Quit date: 10/13/2017    Years since quitting: 6.0   Smokeless tobacco: Never  Vaping Use   Vaping status: Never Used  Substance and Sexual Activity   Alcohol use: Yes    Alcohol/week: 1.0 standard drink of alcohol    Types: 1 Glasses of wine per week    Comment: occasional    Drug use: No   Sexual activity: Yes    Partners: Male    Birth control/protection: None, I.U.D.  Other Topics Concern   Not on file  Social History Narrative   Not on file   Social Drivers of Health   Financial Resource Strain: Low Risk  (10/11/2023)   Received from [facility]   Overall Financial Resource Strain (CARDIA)    Difficulty of Paying Living Expenses: Not very hard  Food Insecurity: No Food Insecurity (10/11/2023)   Received from [facility]   Hunger Vital Sign    Worried About Running Out of Food in the Last Year: Never true    Ran Out of Food in the Last Year: Never true  Transportation Needs: No Transportation Needs (10/11/2023)   Received from [facility] - Transportation    In the past 12 months, has lack of transportation kept you from medical appointments or from getting medications?: No    Lack of Transportation (Non-Medical): No  Physical Activity: Inactive (10/13/2017)   Exercise Vital Sign    Days of Exercise per Week: 0 days    Minutes of Exercise per Session: 0 min  Stress: Not on file  Social  Connections: Moderately Integrated (10/13/2017)   Social Connection and Isolation Panel [NHANES]    Frequency of Communication with Friends and Family: Three times a week    Frequency of Social Gatherings with Friends and Family: Once a week    Attends Religious Services: More than 4 times per year    Active Member of [employer] or Organizations: No    Attends Banker Meetings: Never    Marital Status: Married    Allergies:  Allergies  Allergen Reactions   Iodine Swelling   Shrimp [Shellfish Allergy] Swelling    Metabolic Disorder Labs: No results found for: "HGBA1C", "MPG" No results found for: "PROLACTIN" No results found for: "CHOL", "TRIG", "HDL", "CHOLHDL", "VLDL", "LDLCALC" Lab Results  Component Value Date   TSH 0.676 08/20/2020   TSH 0.423 (L) 09/26/2017    Therapeutic Level Labs:  No results found for: "LITHIUM" No results found for: "VALPROATE" No results found for: "CBMZ"  Current Medications: Current Outpatient Medications  Medication Sig Dispense Refill   amphetamine -dextroamphetamine  (ADDERALL) 20 MG tablet Take 1.5 tablets (30 mg total) by mouth daily. Take 1 tablet daily AM and half tablet daily after lunch 45 tablet 0   Azelastine HCl 137 MCG/SPRAY SOLN Place 2 sprays into both nostrils 2 (two) times daily.     meloxicam  (MOBIC ) 7.5 MG tablet Take 7.5 mg by mouth daily.     traMADol  (ULTRAM ) 50 MG tablet Take 1 tablet (50 mg total) by mouth every 8 (eight) hours as needed for severe pain (pain score 7-10). 15 tablet 0   tretinoin (RETIN-A) 0.025 % cream      valACYclovir  (VALTREX ) 500 MG tablet Take 1 tablet (500 mg total) by mouth 2 (two) times daily. 20 tablet 1   [START ON 11/24/2023] amphetamine -dextroamphetamine  (ADDERALL) 20 MG tablet Take 1.5 tablets (30 mg total) by mouth daily. Take 1 tablet daily AM and half tablet daily after lunch 135 tablet 0   celecoxib (CELEBREX) 200 MG capsule Take 1 capsule by mouth daily. (Patient not taking: Reported  on 10/23/2023)     No current facility-administered medications for this visit.     Musculoskeletal: Strength & Muscle Tone:  UTA Gait & Station:  Seated Patient leans: N/A  Psychiatric Specialty Exam: Review of Systems  Psychiatric/Behavioral: Negative.      There were no vitals taken for this visit.There is no height or weight on file to calculate BMI.  General Appearance: Casual  Eye Contact:  Fair  Speech:  Clear and Coherent  Volume:  Normal  Mood:  Euthymic  Affect:  Appropriate  Thought Process:  Goal Directed and Descriptions of Associations: Intact  Orientation:  Full (Time, Place, and Person)  Thought Content: Logical   Suicidal Thoughts:  No  Homicidal Thoughts:  No  Memory:  Immediate;   Fair Recent;   Fair Remote;   Fair  Judgement:  Fair  Insight:  Fair  Psychomotor Activity:  Normal  Concentration:  Concentration: Fair and Attention Span: Fair  Recall:  [employer] of Knowledge: Fair  Language: Fair  Akathisia:  No  Handed:  Right  AIMS (if indicated): not done  Assets:  Communication Skills Desire for Improvement Housing Social Support  ADL's:  Intact  Cognition: WNL  Sleep:  Fair   Screenings: GAD-7    Garment/textile technologist Visit from 07/31/2023 in [facility] Video Visit from 01/12/2023 in [facility] Office Visit from 09/29/2022 in [facility] Video Visit from 07/21/2022 in [facility] Video Visit from 05/06/2022 in [facility]  Total GAD-7 Score 13 3 5 1 13       PHQ2-9    Flowsheet Row Office Visit from 07/31/2023 in [facility] Video Visit from 05/02/2023 in [facility] Video Visit from 01/12/2023 in [facility] Office Visit  from 09/29/2022 in [facility] Video Visit from 07/21/2022 in [facility]  PHQ-2 Total Score 2 0 0 2 0  PHQ-9 Total Score 9 -- -- 7 --      Flowsheet Row Video Visit from 10/23/2023 in [facility] Office Visit from 07/31/2023 in [facility] Video Visit from 05/02/2023 in [facility]  C-SSRS RISK CATEGORY No Risk No Risk No Risk        Assessment and Plan: Kimberly Knapp is a 38 year old Caucasian female who has a history of MDD, GAD, ADD was evaluated by telemedicine today.  Patient is currently improving with regards to her mood symptoms, discussed assessment and plan as noted below.  MDD-in remission Did not start Lexapro  due to potential drug interactions with tramadol  and Neurontin  for back pain. Currently asymptomatic with no thoughts of self-harm. - Monitor for depression symptoms - Reassess need for Lexapro  at next visit  GAD-stable Reports mood and anxiety are stable with no significant symptoms. - Monitor for anxiety symptoms - Reassess need for anxiety management at next visit   ADHD-stable Currently on Adderall which is managing her ADHD symptoms. - Continue Adderall 30 mg daily - Reviewed [place] PMP AWARxE   Follow-up - Schedule follow-up appointment for May 12 at 8:30 AM.   Consent: Patient/Guardian gives verbal consent for treatment and assignment of benefits for services provided during this visit. Patient/Guardian expressed understanding and agreed to proceed.   This note was generated in part or whole with voice recognition software. Voice recognition is usually quite accurate but there are transcription errors that can and very often do occur. I apologize for any typographical errors that were not detected and corrected.   This note was generated in part or  whole with voice recognition software. Voice recognition is usually quite accurate but there are transcription errors that can and very often do occur. I apologize for any typographical errors that were not detected and corrected.    Dionisios Ricci, MD 10/23/2023, 4:19 PM

## 2023-11-01 ENCOUNTER — Ambulatory Visit: Payer: Managed Care, Other (non HMO) | Admitting: Orthopedic Surgery

## 2023-11-16 ENCOUNTER — Encounter: Payer: Self-pay | Admitting: Orthopedic Surgery

## 2024-01-04 ENCOUNTER — Telehealth: Payer: Self-pay | Admitting: Orthopedic Surgery

## 2024-01-04 ENCOUNTER — Other Ambulatory Visit: Payer: Self-pay | Admitting: Neurosurgery

## 2024-01-04 DIAGNOSIS — M5416 Radiculopathy, lumbar region: Secondary | ICD-10-CM

## 2024-01-04 DIAGNOSIS — M5126 Other intervertebral disc displacement, lumbar region: Secondary | ICD-10-CM

## 2024-01-04 DIAGNOSIS — M47816 Spondylosis without myelopathy or radiculopathy, lumbar region: Secondary | ICD-10-CM

## 2024-01-04 NOTE — Telephone Encounter (Signed)
 Patient called stating she would like to reschedule her appointment (could not be seen until May 7th, added appt to wait list). In the meantime, at her last appointment, she discussed having injections done but was not ready for it at the time. She is wondering if Loyce Ruffini can now send in that referral since the pain clinic takes some time to consult and actually have the injection completed (2-3 weeks). Please advise.

## 2024-01-04 NOTE — Telephone Encounter (Signed)
 She said that she does not have a prefers. Can you send it to [facility]?

## 2024-01-07 NOTE — Telephone Encounter (Signed)
 Injection ordered by Diego Foy at [facility]. She has f/u scheduled with me.

## 2024-01-12 NOTE — Progress Notes (Unsigned)
 Referring Physician:  No referring provider defined for this encounter.  Primary Physician:  Kimberly Marshal, MD (Inactive)  History of Present Illness: 01/17/2024 [name] has a history of ADHD and obesity.    Last seen by me on 09/20/23 for back and right leg pain. 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   Pain was improved at last visit. She called on 01/04/24 and lumbar ESI was ordered with PMR.   She is here for follow up.   As above, her pain is worse. She has constant LBP with intermittent pain/cramping in right groin. No other right leg pain. No numbness, tingling, or weakness in her legs. Pain is worse with bending, prolonged sitting. She is worse in the morning an better as day progresses. Some improvement with ice.   She was on mobic and is out of it. She takes prn flexeril . She is not taking gabapentin .   Bowel/Bladder Dysfunction: none  She quit smoking in 2019. She is vaping with nicotine.   Conservative measures:  Physical therapy: has not participated in PT Multimodal medical therapy including regular antiinflammatories: Cyclobenzaprine , Gabapentin , Ibuprofen , Meloxicam, Methocarbamol, tramadol , prednisone Injections: no epidural steroid injections  Past Surgery: none  Kimberly Knapp has no symptoms of cervical myelopathy.  The symptoms are causing a significant impact on the patient's life.   Review of Systems:  A 10 point review of systems is negative, except for the pertinent positives and negatives detailed in the HPI.  Past Medical History: Past Medical History:  Diagnosis Date   ADHD (attention deficit hyperactivity disorder)    Depression    Goiter    Herpes genitalis    Obesity (BMI 30.0-34.9)    Pregnancy induced hypertension     Past  Surgical History: Past Surgical History:  Procedure Laterality Date   BREAST ENHANCEMENT SURGERY Bilateral    CESAREAN SECTION N/A 05/29/2015   Procedure: CESAREAN SECTION;  Surgeon: Kimberly Shay [name], MD;  Location: [facility] ORS;  Service: Obstetrics;  Laterality: N/A;   CESAREAN SECTION N/A 06/25/2018   Procedure: CESAREAN SECTION, repeat;  Surgeon: Knapp, Kimberly Shay, MD;  Location: [facility] ORS;  Service: Obstetrics;  Laterality: N/A;   DILATION AND CURETTAGE OF UTERUS     THYROID  SURGERY     thyroid  biopsy   WISDOM TOOTH EXTRACTION      Allergies: Allergies as of 01/17/2024 - Review Complete 01/17/2024  Allergen Reaction Noted   Iodine Swelling 05/27/2015   Shrimp [shellfish allergy] Swelling 05/27/2015    Medications: Outpatient Encounter Medications as of 01/17/2024  Medication Sig   amphetamine -dextroamphetamine  (ADDERALL) 20 MG tablet Take 1.5 tablets (30 mg total) by mouth daily. Take 1 tablet daily AM and half tablet daily after lunch   amphetamine -dextroamphetamine  (ADDERALL) 20 MG tablet Take 1.5 tablets (30 mg total) by mouth daily. Take 1 tablet daily AM and half tablet daily after lunch   meloxicam (MOBIC) 15 MG tablet Take 1 tablet (15 mg total) by mouth daily. Take with food.   traMADol  (ULTRAM ) 50 MG tablet Take 1 tablet (50 mg total) by mouth every 8 (eight) hours as needed for severe pain (pain score 7-10).   tretinoin (RETIN-A) 0.025 % cream    valACYclovir  (VALTREX ) 500 MG tablet Take 1 tablet (500 mg total) by mouth 2 (two) times daily.   [DISCONTINUED] Azelastine  HCl 137 MCG/SPRAY SOLN Place 2 sprays into both nostrils 2 (two) times daily.   [DISCONTINUED] celecoxib (CELEBREX) 200 MG capsule Take 1 capsule by mouth daily. (Patient not taking: Reported on 10/23/2023)   [DISCONTINUED] meloxicam (MOBIC) 7.5 MG tablet Take 7.5 mg by mouth daily.   No facility-administered encounter medications on file as of 01/17/2024.    Social History: Social History   Tobacco Use    Smoking status: Former    Current packs/day: 0.00    Types: Cigarettes    Quit date: 10/13/2017    Years since quitting: 6.2   Smokeless tobacco: Never  Vaping Use   Vaping status: Every Day  Substance Use Topics   Alcohol use: Yes    Alcohol/week: 1.0 standard drink of alcohol    Types: 1 Glasses of wine per week    Comment: occasional    Drug use: No    Family Medical History: Family History  Problem Relation Age of Onset   Diabetes type II Other    Lung cancer Other    Heart disease Other    Lung cancer Other    ADD / ADHD Father    Alcohol abuse Maternal Grandfather     Physical Examination: Vitals:   01/17/24 0939  BP: 120/86      Awake, alert, oriented to person, place, and time.  Speech is clear and fluent. Fund of knowledge is appropriate.   Cranial Nerves: Pupils equal round and reactive to light.  Facial tone is symmetric.    No abnormal lesions on exposed skin.   Strength: Side Iliopsoas Quads Hamstring PF DF EHL  R 5 5 5 5 5 5   L 5 5 5 5 5 5    Reflexes are 2+ and symmetric at the patella and achilles.    Clonus is not present.   Bilateral lower extremity sensation is intact to light touch.     Gait is normal.     Medical Decision Making  Imaging: none  Assessment and Plan: She has constant LBP with intermittent pain/cramping in right groin. No other right leg pain. No numbness, tingling, or weakness in her legs. Pain has been worse in last few weeks.   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   No weakness on her exam.   Treatment options discussed with patient and following plan made:   - Message to PMR at [facility] regarding ESI- fast track order was put in for right L4-L5 TF ESI on 01/04/24. She should call them if she does not hear anything by tomorrow.   - PT for lumbar spine. Orders to [facility].  - Continue on prn mobic. Reviewed dosing and side effects. Take with food. Refill given.  - She has flexeril  to use prn.  - Follow up with me in 6-8 weeks and prn.   I spent a total of 20 minutes in face-to-face and non-face-to-face activities related to this patient's care today including review of outside records, review of imaging, review of symptoms, physical exam, discussion of differential diagnosis, discussion of treatment options, and documentation.   Lucetta Russel PA-C Dept. of Neurosurgery

## 2024-01-17 ENCOUNTER — Encounter: Payer: Self-pay | Admitting: Orthopedic Surgery

## 2024-01-17 ENCOUNTER — Ambulatory Visit (INDEPENDENT_AMBULATORY_CARE_PROVIDER_SITE_OTHER): Payer: Self-pay | Admitting: Orthopedic Surgery

## 2024-01-17 VITALS — BP 120/86 | Ht 67.0 in | Wt 231.0 lb

## 2024-01-17 DIAGNOSIS — M48061 Spinal stenosis, lumbar region without neurogenic claudication: Secondary | ICD-10-CM

## 2024-01-17 DIAGNOSIS — M5126 Other intervertebral disc displacement, lumbar region: Secondary | ICD-10-CM | POA: Diagnosis not present

## 2024-01-17 DIAGNOSIS — M5416 Radiculopathy, lumbar region: Secondary | ICD-10-CM

## 2024-01-17 DIAGNOSIS — M47816 Spondylosis without myelopathy or radiculopathy, lumbar region: Secondary | ICD-10-CM

## 2024-01-17 DIAGNOSIS — M51369 Other intervertebral disc degeneration, lumbar region without mention of lumbar back pain or lower extremity pain: Secondary | ICD-10-CM

## 2024-01-17 MED ORDER — MELOXICAM 15 MG PO TABS: 15.0000 mg | ORAL_TABLET | Freq: Every day | ORAL | 1 refills | Status: AC

## 2024-01-17 NOTE — Patient Instructions (Signed)
 It was so nice to see you today. Thank you so much for coming in.    I sent physical therapy orders to [facility] in [place]. You can call them at [phone] if you don't hear from them to schedule your visit.   I want you to see physical medicine and rehab at the [facility] to for a lumbar injection. Dr. Erman Hayward, Dr. Aleen Ammons, and their PA Laurina Popper are great and will take good care of you. They should call you to schedule an appointment or you can call them at [phone]. I sent them a message today and you should be hearing from them.   I sent a refill of meloxicam to your pharmacy to take for pain and inflammation. Take with food. Stop with any GI upset, nausea, vomiting, or blood in your stool. Do not take motrin  with this medication.   I will see you back in 6-8 weeks. Please do not hesitate to call if you have any questions or concerns. You can also message me in MyChart.   Lucetta Russel PA-C [phone]     The physicians and staff at [facility] Neurosurgery at [facility] are committed to providing excellent care. You may receive a survey asking for feedback about your experience at our office. We value you your feedback and appreciate you taking the time to to fill it out. The [facility] leadership team is also available to discuss your experience in person, feel free to contact us  [phone].

## 2024-01-22 ENCOUNTER — Ambulatory Visit: Payer: 59 | Admitting: Psychiatry

## 2024-02-07 ENCOUNTER — Other Ambulatory Visit: Payer: Self-pay

## 2024-02-07 ENCOUNTER — Ambulatory Visit (INDEPENDENT_AMBULATORY_CARE_PROVIDER_SITE_OTHER): Admitting: Psychiatry

## 2024-02-07 ENCOUNTER — Encounter: Payer: Self-pay | Admitting: Psychiatry

## 2024-02-07 VITALS — BP 117/84 | HR 69 | Temp 98.1°F | Ht 67.0 in | Wt 209.6 lb

## 2024-02-07 DIAGNOSIS — F3342 Major depressive disorder, recurrent, in full remission: Secondary | ICD-10-CM

## 2024-02-07 DIAGNOSIS — F411 Generalized anxiety disorder: Secondary | ICD-10-CM

## 2024-02-07 DIAGNOSIS — Z79899 Other long term (current) drug therapy: Secondary | ICD-10-CM

## 2024-02-07 DIAGNOSIS — F900 Attention-deficit hyperactivity disorder, predominantly inattentive type: Secondary | ICD-10-CM

## 2024-02-07 MED ORDER — AMPHETAMINE-DEXTROAMPHETAMINE 20 MG PO TABS: 30.0000 mg | ORAL_TABLET | Freq: Every day | ORAL | 0 refills | Status: DC

## 2024-02-07 NOTE — Progress Notes (Unsigned)
 BH MD OP Progress Note  02/07/2024 2:26 PM Kimberly Knapp  MRN:  [id number]  Chief Complaint:  Chief Complaint  Patient presents with   Follow-up   Anxiety   Depression   ADD   Medication Refill   Discussed the use of AI scribe software for clinical note transcription with the patient, who gave verbal consent to proceed.  History of Present Illness Kimberly Knapp is a 38 year old Caucasian female, married, lives in [place], has a history of generalized anxiety, depression, ADHD who presents for follow-up for medication management.  She manages her anxiety without medication, employing strategies such as addressing the cause of anxiety when possible and accepting situations when change is not feasible. No panic attacks are reported, and she utilizes deep breathing exercises to manage anxiety. Although anxiety can worsen at times, she handles it without pharmacological intervention.  She is currently taking Adderall, one and a half tablets daily, with one in the morning and half at lunch.  Adderall has been helpful with attention and focus.  Her mood is stable, with no significant mood swings.  Denies suicidality, homicidality or perceptual disturbances.  She and her husband have been practicing intermittent fasting since March to address her back issues and improve overall health. She has experienced weight loss since starting this regimen, though she is unsure of the exact amount. No changes in appetite are noted, and her sleep is reported as good.    Visit Diagnosis:    ICD-10-CM   1. MDD (major depressive disorder), recurrent, in full remission (HCC)  F33.42     2. GAD (generalized anxiety disorder)  F41.1     3. Attention deficit hyperactivity disorder (ADHD), predominantly inattentive type  F90.0 amphetamine -dextroamphetamine  (ADDERALL) 20 MG tablet      Past Psychiatric History: I have reviewed past psychiatric history from progress note on 10/13/2017.  Past trials of  Zoloft, Prozac, Celexa, Wellbutrin, Adderall.  Past Medical History:  Past Medical History:  Diagnosis Date   ADHD (attention deficit hyperactivity disorder)    Depression    Goiter    Herpes genitalis    Obesity (BMI 30.0-34.9)    Pregnancy induced hypertension     Past Surgical History:  Procedure Laterality Date   BREAST ENHANCEMENT SURGERY Bilateral    CESAREAN SECTION N/A 05/29/2015   Procedure: CESAREAN SECTION;  Surgeon: Margarie Shay [name], MD;  Location: [facility] ORS;  Service: Obstetrics;  Laterality: N/A;   CESAREAN SECTION N/A 06/25/2018   Procedure: CESAREAN SECTION, repeat;  Surgeon: [name], Margarie Shay, MD;  Location: [facility] ORS;  Service: Obstetrics;  Laterality: N/A;   DILATION AND CURETTAGE OF UTERUS     THYROID  SURGERY     thyroid  biopsy   WISDOM TOOTH EXTRACTION      Family Psychiatric History: I have reviewed family psychiatric history from progress note on 10/13/2017.  Family History:  Family History  Problem Relation Age of Onset   Diabetes type II Other    Lung cancer Other    Heart disease Other    Lung cancer Other    ADD / ADHD Father    Alcohol abuse Maternal Grandfather     Social History: I have reviewed social history from progress note on 10/13/2017. Social History   Socioeconomic History   Marital status: Married    Spouse name: Kimberly Knapp    Number of children: 3   Years of education: Not on file   Highest education level: Associate degree: occupational, Scientist, product/process development, or vocational program  Occupational History   Occupation: [employer]    Comment: not employed  Tobacco Use   Smoking status: Former    Current packs/day: 0.00    Types: Cigarettes    Quit date: 10/13/2017    Years since quitting: 6.3   Smokeless tobacco: Never  Vaping Use   Vaping status: Every Day  Substance and Sexual Activity   Alcohol use: Yes    Alcohol/week: 1.0 standard drink of alcohol    Types: 1 Glasses of wine per week    Comment: occasional    Drug use: No   Sexual  activity: Yes    Partners: Male    Birth control/protection: None, I.U.D.  Other Topics Concern   Not on file  Social History Narrative   Not on file   Social Drivers of Health   Financial Resource Strain: Low Risk  (10/11/2023)   Received [facility]   Overall Financial Resource Strain (CARDIA)    Difficulty of Paying Living Expenses: Not very hard  Food Insecurity: No Food Insecurity (10/11/2023)   Received from [facility]   Hunger Vital Sign    Worried About Running Out of Food in the Last Year: Never true    Ran Out of Food in the Last Year: Never true  Transportation Needs: No Transportation Needs (10/11/2023)   Received from [facility] - Transportation    In the past 12 months, has lack of transportation kept you from medical appointments or from getting medications?: No    Lack of Transportation (Non-Medical): No  Physical Activity: Inactive (10/13/2017)   Exercise Vital Sign    Days of Exercise per Week: 0 days    Minutes of Exercise per Session: 0 min  Stress: Not on file  Social Connections: Moderately Integrated (10/13/2017)   Social Connection and Isolation Panel [NHANES]    Frequency of Communication with Friends and Family: Three times a week    Frequency of Social Gatherings with Friends and Family: Once a week    Attends Religious Services: More than 4 times per year    Active Member of [employer] or Organizations: No    Attends Banker Meetings: Never    Marital Status: Married    Allergies:  Allergies  Allergen Reactions   Iodine Swelling   Shrimp [Shellfish Allergy] Swelling    Metabolic Disorder Labs: No results found for: "HGBA1C", "MPG" No results found for: "PROLACTIN" No results found for: "CHOL", "TRIG", "HDL", "CHOLHDL", "VLDL", "LDLCALC" Lab Results  Component Value Date   TSH 0.676 08/20/2020   TSH 0.423 (L) 09/26/2017    Therapeutic Level Labs: No results found  for: "LITHIUM" No results found for: "VALPROATE" No results found for: "CBMZ"  Current Medications: Current Outpatient Medications  Medication Sig Dispense Refill   meloxicam  (MOBIC ) 15 MG tablet Take 1 tablet (15 mg total) by mouth daily. Take with food. 30 tablet 1   traMADol  (ULTRAM ) 50 MG tablet Take 1 tablet (50 mg total) by mouth every 8 (eight) hours as needed for severe pain (pain score 7-10). 15 tablet 0   tretinoin (RETIN-A) 0.025 % cream      valACYclovir  (VALTREX ) 500 MG tablet Take 1 tablet (500 mg total) by mouth 2 (two) times daily. 20 tablet 1   [START ON 02/25/2024] amphetamine -dextroamphetamine  (ADDERALL) 20 MG tablet Take 1.5 tablets (30 mg total) by mouth daily. Take 1 tablet daily AM and half tablet daily after lunch 135 tablet 0  No current facility-administered medications for this visit.     Musculoskeletal: Strength & Muscle Tone: within normal limits Gait & Station: normal Patient leans: N/A  Psychiatric Specialty Exam: Review of Systems  Psychiatric/Behavioral:  The patient is nervous/anxious.     Blood pressure 117/84, pulse 69, temperature 98.1 F (36.7 C), temperature source Temporal, height 5\' 7"  (1.702 m), weight 209 lb 9.6 oz (95.1 kg).Body mass index is 32.83 kg/m.  General Appearance: Casual  Eye Contact:  Good  Speech:  Clear and Coherent  Volume:  Normal  Mood:  Anxious  Affect:  Congruent  Thought Process:  Goal Directed and Descriptions of Associations: Intact  Orientation:  Full (Time, Place, and Person)  Thought Content: Logical   Suicidal Thoughts:  No  Homicidal Thoughts:  No  Memory:  Immediate;   Fair Recent;   Fair Remote;   Fair  Judgement:  Fair  Insight:  Fair  Psychomotor Activity:  Normal  Concentration:  Concentration: Fair and Attention Span: Fair  Recall:  [employer] of Knowledge: Fair  Language: Fair  Akathisia:  No  Handed:  Right  AIMS (if indicated): not done  Assets:  Communication Skills Desire for  Improvement Housing Intimacy Social Support Transportation  ADL's:  Intact  Cognition: WNL  Sleep:  Fair   Screenings: GAD-7    Garment/textile technologist Visit from 02/07/2024 in [facility] Office Visit from 07/31/2023 in [facility] Video Visit from 01/12/2023 in [facility] Office Visit from 09/29/2022 in [facility] Video Visit from 07/21/2022 in [facility]  Total GAD-7 Score 4 13 3 5 1       PHQ2-9    Flowsheet Row Office Visit from 02/07/2024 in [facility] Office Visit from 07/31/2023 in [facility] Video Visit from 05/02/2023 in [facility] Video Visit from 01/12/2023 in [facility] Office Visit from 09/29/2022 in [facility]  PHQ-2 Total Score 1 2 0 0 2  PHQ-9 Total Score -- 9 -- -- 7      Flowsheet Row Video Visit from 10/23/2023 in [facility] Office Visit from 07/31/2023 in [facility] Video Visit from 05/02/2023 in [facility]  C-SSRS RISK CATEGORY No Risk No Risk No Risk        Assessment and Plan: BAYLEI SIEBELS is a 38 year old Caucasian female who has a history of depression, anxiety, ADD was evaluated in office today.  Discussed assessment and plan as noted below.  MDD in remission Currently denies any depression symptoms. Continue to monitor closely and reassess in future sessions.  Generalized anxiety disorder-stable Currently reports anxiety symptoms are manageable with coping strategies like breathing. Continue to reassess in future  sessions.  ADHD-stable Currently well managed on Adderall. Continue Adderall 30 mg daily I have send 90 days supply. Reviewed [place] PMP AWARxE  High risk medication use-will order urine drug screen.  Patient to go to [facility] lab.  Follow-up Follow-up in clinic in 3 months or sooner if needed.   Collaboration of Care: Collaboration of Care: {BH OP Collaboration of Care:[id number]}  Patient/Guardian was advised Release of Information must be obtained prior to any record release in order to collaborate their care with an outside provider. Patient/Guardian was advised  if they have not already done so to contact the registration department to sign all necessary forms in order for us  to release information regarding their care.   Consent: Patient/Guardian gives verbal consent for treatment and assignment of benefits for services provided during this visit. Patient/Guardian expressed understanding and agreed to proceed.    Ellyce Lafevers, MD 02/07/2024, 2:26 PM

## 2024-02-15 ENCOUNTER — Ambulatory Visit (INDEPENDENT_AMBULATORY_CARE_PROVIDER_SITE_OTHER): Payer: Self-pay | Admitting: Podiatry

## 2024-02-15 ENCOUNTER — Ambulatory Visit (INDEPENDENT_AMBULATORY_CARE_PROVIDER_SITE_OTHER)

## 2024-02-15 ENCOUNTER — Encounter: Payer: Self-pay | Admitting: Podiatry

## 2024-02-15 VITALS — Ht 67.0 in | Wt 207.0 lb

## 2024-02-15 DIAGNOSIS — M79671 Pain in right foot: Secondary | ICD-10-CM | POA: Diagnosis not present

## 2024-02-15 DIAGNOSIS — M722 Plantar fascial fibromatosis: Secondary | ICD-10-CM | POA: Diagnosis not present

## 2024-02-15 DIAGNOSIS — M67471 Ganglion, right ankle and foot: Secondary | ICD-10-CM | POA: Diagnosis not present

## 2024-02-15 MED ORDER — TRIAMCINOLONE ACETONIDE 10 MG/ML IJ SUSP
10.0000 mg | Freq: Once | INTRAMUSCULAR | Status: AC
  Administered 2024-02-15: 10 mg

## 2024-02-15 NOTE — Progress Notes (Signed)
 Chief Complaint  Patient presents with   Foot Pain    " I have noticed a spot on the side of my big toe, and tried to wear shoes and pain ful to touch. I have also had some heel pain as well.     HPI: 38 y.o. female presents today with concern for painful mass over the right first metatarsophalangeal joint area.  States that she has noticed this over the past several weeks and has become more painful.  Also complains of some right plantar heel pain going on for several weeks as well.  Past Medical History:  Diagnosis Date   ADHD (attention deficit hyperactivity disorder)    Depression    Goiter    Herpes genitalis    Obesity (BMI 30.0-34.9)    Pregnancy induced hypertension     Past Surgical History:  Procedure Laterality Date   BREAST ENHANCEMENT SURGERY Bilateral    CESAREAN SECTION N/A 05/29/2015   Procedure: CESAREAN SECTION;  Surgeon: Margarie Shay [name], MD;  Location: [facility] ORS;  Service: Obstetrics;  Laterality: N/A;   CESAREAN SECTION N/A 06/25/2018   Procedure: CESAREAN SECTION, repeat;  Surgeon: [name], Margarie Shay, MD;  Location: [facility] ORS;  Service: Obstetrics;  Laterality: N/A;   DILATION AND CURETTAGE OF UTERUS     THYROID  SURGERY     thyroid  biopsy   WISDOM TOOTH EXTRACTION      Allergies  Allergen Reactions   Iodine Swelling   Shrimp [Shellfish Allergy] Swelling    ROS denies any nausea, vomiting, fever, chills, chest pain, shortness of breath   Physical Exam: There were no vitals filed for this visit.  General: The patient is alert and oriented x3 in no acute distress.  Dermatology: Skin is warm, dry and supple bilateral lower extremities. Interspaces are clear of maceration and debris.    Vascular: Palpable pedal pulses bilaterally. Capillary refill within normal limits.  No appreciable edema.  No erythema or calor.  Neurological: Light touch sensation grossly intact bilateral feet.   Musculoskeletal Exam: Palpable mass dorsal medial right first  metatarsal neck region, appears somewhat fluid-filled.  Tender on palpation.  On aspiration as detailed below there is clear viscous fluid with appearance consistent with ganglion.  There is also tenderness on palpation of right heel plantar medial and plantar central aspect without any nodules or lesions noted along plantar fascia.  Ankle joint dorsiflexion less than 10 degrees with knee extended.  No pain on palpation of Achilles tendon or posterior right heel.  Radiographic Exam: Right foot 3 views weightbearing 02/15/2024 Normal osseous mineralization. Joint spaces preserved.  No fractures or osseous irregularities noted.  Slight bunion deformity appreciated.  Slight increased outpouching of soft tissue envelope appreciated dorsal medial aspect first metatarsal neck level on oblique view.  Assessment/Plan of Care: 1. Ganglion cyst of right foot   2. Plantar fasciitis of right foot      Meds ordered this encounter  Medications   triamcinolone  acetonide (KENALOG ) 10 MG/ML injection 10 mg   None  Discussed clinical findings with patient today.  Radiographs reviewed with patient.  Findings consistent with cyst over right first metatarsal.  Verbal consent obtained to perform aspiration of the cyst with corticosteroid injection.  Alcohol skin prep over the right first metatarsal.  Area anesthetized with 3 cc of one-to-one ratio of 0.5% Marcaine  plain and 2% lidocaine  plain.  Scant amount of highly viscous clear, jellylike fluid aspirated using 18-gauge needle on 10 cc syringe.  Further fluid approximately 2 to 3 cc were able to be exsanguinated from the aspiration site.  Injected 0.5 cc of 0.5% Marcaine  plain with 10 mg Kenalog  to this site and dressed with bandage.  Patient tolerated procedure well.  Compressive bandage applied.  Advised patient to maintain compressive Coban dressing for the next 2 to 3 weeks.  She may change dressing at home as needed after leaving initial dressing in place for  least 3 days.  # Right foot plantar fasciitis Briefly discussed findings consistent with right foot plantar fasciitis with patient.  Discussed treatment including stretching, good supportive shoe gear, NSAIDs, accommodative insert, night splint, functional bracing.  Plan to reevaluate this in approximately 2 weeks.  Instructed patient to take over-the-counter naproxen twice a day for 2 weeks.  Home stretching regimen discussed at length with patient with written instructions dispensed. -I certify that this diagnosis represents a distinct and separate diagnosis that requires evaluation and treatment separate from other procedures or diagnosis   Saige Busby L. Lunda Salines, AACFAS [facility]     [address] [place], [place] [address]                Office [phone]  Fax [phone]

## 2024-02-15 NOTE — Patient Instructions (Signed)

## 2024-03-01 NOTE — Progress Notes (Signed)
 Referring Physician:  No referring provider defined for this encounter.  Primary Physician:  Maryjane Public, MD (Inactive)  History of Present Illness: 03/11/2024 Ms. Shanaiya Buffone has a history of ADHD and obesity.    Last seen by me on 01/17/24 for back and right leg pain. 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   She was sent to PT at last visit. She was to take mobic  and prn flexeril .   She had right L3-L4 TF and right L4-L5 TF ESI on 02/19/24.  She is here for follow up.   She had no pain after her injection for about 2 weeks, but pain in her lower back is starting to return. This is intermittent and mostly tolerable. No leg pain. Pain is worse with standing in the mornings. She does better as she gets moving.  No numbness, tingling, or weakness in her legs.   She did a few visits of PT at [facility]. It was $100 a visit.   She is taking mobic . She takes prn flexeril . She is not taking gabapentin .   Bowel/Bladder Dysfunction: none  She quit smoking in 2019. She is vaping with nicotine.   Conservative measures:  Physical therapy: did a few visits at [facility] Multimodal medical therapy including regular antiinflammatories: Cyclobenzaprine , Gabapentin , Ibuprofen , Meloxicam , Methocarbamol, tramadol , prednisone Injections:  02/19/2024: Right L3-4 and right L4-5 transforaminal ESI   Past Surgery: none  MYLEEN BRAILSFORD has no symptoms of cervical myelopathy.  The symptoms are causing a significant impact on the patient's life.   Review of Systems:  A 10 point review of systems is negative, except for the pertinent positives and negatives detailed in the HPI.  Past Medical History: Past Medical History:  Diagnosis Date   ADHD (attention deficit hyperactivity disorder)    Depression     Goiter    Herpes genitalis    Obesity (BMI 30.0-34.9)    Pregnancy induced hypertension     Past Surgical History: Past Surgical History:  Procedure Laterality Date   BREAST ENHANCEMENT SURGERY Bilateral    CESAREAN SECTION N/A 05/29/2015   Procedure: CESAREAN SECTION;  Surgeon: Mitzie BROCKS [name], MD;  Location: [facility] ORS;  Service: Obstetrics;  Laterality: N/A;   CESAREAN SECTION N/A 06/25/2018   Procedure: CESAREAN SECTION, repeat;  Surgeon: [name], Mitzie BROCKS, MD;  Location: [facility] ORS;  Service: Obstetrics;  Laterality: N/A;   DILATION AND CURETTAGE OF UTERUS     THYROID  SURGERY     thyroid  biopsy   WISDOM TOOTH EXTRACTION      Allergies: Allergies as of 03/11/2024 - Review Complete 03/11/2024  Allergen Reaction Noted   Iodine Swelling 05/27/2015   Shrimp [shellfish allergy] Swelling 05/27/2015    Medications: Outpatient Encounter Medications as of 03/11/2024  Medication Sig   amphetamine -dextroamphetamine  (ADDERALL) 20 MG tablet Take 1.5 tablets (30 mg total) by mouth daily. Take 1 tablet daily AM and half tablet daily after lunch   meloxicam  (MOBIC ) 15 MG tablet Take 1 tablet (15 mg total) by mouth daily. Take with food.   traMADol  (ULTRAM ) 50 MG tablet Take 1 tablet (50 mg total) by mouth every 8 (eight) hours as needed for severe pain (pain score 7-10).   tretinoin (RETIN-A) 0.025 % cream    valACYclovir  (VALTREX ) 500 MG tablet Take 1 tablet (500 mg total) by mouth 2 (  two) times daily.   No facility-administered encounter medications on file as of 03/11/2024.    Social History: Social History   Tobacco Use   Smoking status: Former    Current packs/day: 0.00    Types: Cigarettes    Quit date: 10/13/2017    Years since quitting: 6.4   Smokeless tobacco: Never  Vaping Use   Vaping status: Every Day  Substance Use Topics   Alcohol use: Yes    Alcohol/week: 1.0 standard drink of alcohol    Types: 1 Glasses of wine per week    Comment: occasional    Drug use: No     Family Medical History: Family History  Problem Relation Age of Onset   Diabetes type II Other    Lung cancer Other    Heart disease Other    Lung cancer Other    ADD / ADHD Father    Alcohol abuse Maternal Grandfather     Physical Examination: Vitals:   03/11/24 0938  BP: 122/76       Awake, alert, oriented to person, place, and time.  Speech is clear and fluent. Fund of knowledge is appropriate.   Cranial Nerves: Pupils equal round and reactive to light.  Facial tone is symmetric.    No abnormal lesions on exposed skin.   Strength: Side Iliopsoas Quads Hamstring PF DF EHL  R 5 5 5 5 5 5   L 5 5 5 5 5 5    Reflexes are 2+ and symmetric at the patella and achilles.    Clonus is not present.   Bilateral lower extremity sensation is intact to light touch.     Gait is normal.     Medical Decision Making  Imaging: none  Assessment and Plan: She had no pain after her injection for about 2 weeks, but pain in her lower back is starting to return. This is intermittent and mostly tolerable. No leg pain.   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   Treatment options discussed with patient and following plan made:   - Given lumbar HEP to do at home. She will look into personal trainer- make sure he/she knows her lumbar history. PT copay was $100 and she did not feel it was worth it.  - One last refill of ultram  to have on hand for flare ups. Last given in January and she just finished #15 pills. PMP reviewed and is appropriate. Reviewed dosing and side effects. Would need to get further refills from PCP.  - Continue on prn mobic  and prn flexeril .  - Follow up prn at her request.   I spent a total of 20 minutes in face-to-face and non-face-to-face activities related to this  patient's care today including review of outside records, review of imaging, review of symptoms, physical exam, discussion of differential diagnosis, discussion of treatment options, and documentation.   Glade Boys PA-C Dept. of Neurosurgery

## 2024-03-04 NOTE — Telephone Encounter (Signed)
 I have called the patient several times to have her come to the office and pick up the order I have tried to fax this order several times I even called the support line and they gave me the same fax number I left the patient a voicemail on Friday and today

## 2024-03-11 ENCOUNTER — Ambulatory Visit: Admitting: Orthopedic Surgery

## 2024-03-11 ENCOUNTER — Encounter: Payer: Self-pay | Admitting: Orthopedic Surgery

## 2024-03-11 VITALS — BP 122/76 | Ht 67.0 in | Wt 206.4 lb

## 2024-03-11 DIAGNOSIS — M48061 Spinal stenosis, lumbar region without neurogenic claudication: Secondary | ICD-10-CM

## 2024-03-11 DIAGNOSIS — M4726 Other spondylosis with radiculopathy, lumbar region: Secondary | ICD-10-CM

## 2024-03-11 DIAGNOSIS — M5126 Other intervertebral disc displacement, lumbar region: Secondary | ICD-10-CM | POA: Diagnosis not present

## 2024-03-11 DIAGNOSIS — M5416 Radiculopathy, lumbar region: Secondary | ICD-10-CM

## 2024-03-11 DIAGNOSIS — M47816 Spondylosis without myelopathy or radiculopathy, lumbar region: Secondary | ICD-10-CM

## 2024-03-11 MED ORDER — TRAMADOL HCL 50 MG PO TABS: 50.0000 mg | ORAL_TABLET | Freq: Three times a day (TID) | ORAL | 0 refills | Status: AC | PRN

## 2024-03-11 NOTE — Patient Instructions (Signed)
 It was so nice to see you today. Thank you so much for coming in.    I'm so glad you are doing better!  I sent one last refill of tramadol  to your pharmacy. Take only as needed for severe pain. This can make you sleepy and/or constipated.   Continue on meloxicam  and cyclobenzaprine  as needed.   Stay in touch and let me know if you need anything!  Please do not hesitate to call if you have any questions or concerns. You can also message me in MyChart.   Glade Boys PA-C [phone]     The physicians and staff at [facility] Neurosurgery at [facility] are committed to providing excellent care. You may receive a survey asking for feedback about your experience at our office. We value you your feedback and appreciate you taking the time to to fill it out. The [facility] leadership team is also available to discuss your experience in person, feel free to contact us  [phone].

## 2024-04-08 ENCOUNTER — Telehealth: Payer: Self-pay

## 2024-04-08 ENCOUNTER — Encounter: Payer: Self-pay | Admitting: Psychiatry

## 2024-04-08 ENCOUNTER — Telehealth (INDEPENDENT_AMBULATORY_CARE_PROVIDER_SITE_OTHER): Admitting: Psychiatry

## 2024-04-08 DIAGNOSIS — Z79899 Other long term (current) drug therapy: Secondary | ICD-10-CM

## 2024-04-08 DIAGNOSIS — F411 Generalized anxiety disorder: Secondary | ICD-10-CM

## 2024-04-08 DIAGNOSIS — F331 Major depressive disorder, recurrent, moderate: Secondary | ICD-10-CM | POA: Diagnosis not present

## 2024-04-08 DIAGNOSIS — F900 Attention-deficit hyperactivity disorder, predominantly inattentive type: Secondary | ICD-10-CM

## 2024-04-08 MED ORDER — VILAZODONE HCL 10 MG PO TABS: 10.0000 mg | ORAL_TABLET | Freq: Every day | ORAL | 0 refills | Status: DC

## 2024-04-08 MED ORDER — VILAZODONE HCL 20 MG PO TABS: 20.0000 mg | ORAL_TABLET | Freq: Every day | ORAL | 0 refills | Status: DC

## 2024-04-08 NOTE — Patient Instructions (Signed)
 [email or web address] for therapy    Vilazodone  Tablets What is this medication? VILAZODONE  (vil [place] oh done) treats depression. It increases the amount of serotonin in the brain, a substance that helps regulate mood. This medicine may be used for other purposes; ask your health care provider or pharmacist if you have questions. COMMON BRAND NAME(S): VIIBRYD  What should I tell my care team before I take this medication? They need to know if you have any of these conditions: Bipolar disorder or a family history of bipolar disorder Glaucoma Liver disease Low levels of sodium in the blood Receiving electroconvulsive therapy (ECT) Seizures Suicidal thoughts, plans, or attempt by you or a family member An unusual or allergic reaction to vilazodone , other medications, foods, dyes, or preservatives Pregnant or trying to get pregnant Breastfeeding How should I use this medication? Take this medication by mouth with water. Take it as directed on the prescription label at the same time every day. Take it with food. Keep taking it unless your care team tells you to stop. A special MedGuide will be given to you by the pharmacist with each prescription and refill. Be sure to read this information carefully each time. Talk to your care team about the use of this medication in children. Special care may be needed. Overdosage: If you think you have taken too much of this medicine contact a poison control center or emergency room at once. NOTE: This medicine is only for you. Do not share this medicine with others. What if I miss a dose? If you miss a dose, take it as soon as you can. If it is almost time for your next dose, take only that dose. Do not take double or extra doses. What may interact with this medication? Do not take this medication with any of the following: Linezolid MAOIs, such as Marplan, Nardil, and Parnate Methylene blue (injected into a vein) This medication may also  interact with the following: Aspirin and aspirin-like medications Certain medications for depression, anxiety, or other mental health conditions Certain medications for migraines, such as sumatriptan Certain medications for seizures Lithium Medications that treat or prevent blood clots, such as warfarin NSAIDs, medications for pain and inflammation, such as ibuprofen  or naproxen Opioids St. John's wort Stimulant medications for ADHD, weight loss, or staying awake Tryptophan This list may not describe all possible interactions. Give your health care provider a list of all the medicines, herbs, non-prescription drugs, or dietary supplements you use. Also tell them if you smoke, drink alcohol, or use illegal drugs. Some items may interact with your medicine. What should I watch for while using this medication? Visit your care team for regular checks on your progress. It may be some time before you see the benefit from this medication. This medication may cause thoughts of suicide or depression. This includes sudden changes in mood, behaviors, or thoughts. These changes can happen at any time but are more common in the beginning of treatment or after a change in dose. Call your care team right away if you experience these thoughts or worsening depression. This medication may affect your coordination, reaction time, or judgment. Do not drive or operate machinery until you know how this medication affects you. Sit up or stand slowly to reduce the risk of dizzy or fainting spells. Drinking alcohol with this medication can increase the risk of these side effects. Your mouth may get dry. Chewing sugarless gum or sucking hard candy and drinking plenty of water may help. Contact your care team  if the problem does not go away or is severe. What side effects may I notice from receiving this medication? Side effects that you should report to your care team as soon as possible: Allergic reactions--skin rash,  itching, hives, swelling of the face, lips, tongue, or throat Bleeding--bloody or black, tar-like stools, vomiting blood or brown material that looks like coffee grounds, red or dark brown urine, small red or purple spots on skin, unusual bruising or bleeding Irritability, confusion, fast or irregular heartbeat, muscle stiffness, twitching muscles, sweating, high fever, seizure, chills, vomiting, diarrhea, which may be signs of serotonin syndrome Low sodium level--muscle weakness, fatigue, dizziness, headache, confusion Seizures Sudden eye pain or change in vision such as blurry vision, seeing halos around lights, vision loss Thoughts of suicide or self-harm, worsening mood, feelings of depression Side effects that usually do not require medical attention (report to your care team if they continue or are bothersome): Change in sex drive or performance Diarrhea Dry mouth Headache Nausea Trouble sleeping Vomiting This list may not describe all possible side effects. Call your doctor for medical advice about side effects. You may report side effects to FDA at 1-800-FDA-1088. Where should I keep my medication? Keep out of the reach of children. Store at room temperature between 15 and 30 degrees C (59 to 86 degrees F). Throw away any unused medication after the expiration date. NOTE: This sheet is a summary. It may not cover all possible information. If you have questions about this medicine, talk to your doctor, pharmacist, or health care provider.  2024 Elsevier/Gold Standard (2023-01-18 00:00:00)

## 2024-04-08 NOTE — Telephone Encounter (Signed)
 Patient is scheduled for an appointment today for evaluation.

## 2024-04-08 NOTE — Telephone Encounter (Signed)
 pt called states that she started back on the lexapro  20mg  taking 1/2. she stated that she was having alot of anxiety and that she had some of the lexapro  20mg  left over and so she started taking 1/2. she stated that over the weekend she started being dizzy and had a foggy head. She stated that usually she took at night but she could not remember if she took it the Thursday night so she stated that she took it on Friday morning and she does not know if that had something to do with it or not.  I asked pt that I did not see where she was suppose to be taking the lexapro  and she stated that she been going through some stuff and she had some 20mg  left over so she started taking it.  I told her that It looks as if it had been a long time since she was on 20mg  and asked her if they were exp. She stated that she did not think that they had.

## 2024-04-08 NOTE — Progress Notes (Unsigned)
 Virtual Visit via Video Note  I connected with Kimberly Knapp on 04/08/24 at  3:30 PM EDT by a video enabled telemedicine application and verified that I am speaking with the correct person using two identifiers.  Location Provider Location : [facility] Patient Location : Home  Participants: Patient , Provider   I discussed the limitations of evaluation and management by telemedicine and the availability of in person appointments. The patient expressed understanding and agreed to proceed.   I discussed the assessment and treatment plan with the patient. The patient was provided an opportunity to ask questions and all were answered. The patient agreed with the plan and demonstrated an understanding of the instructions.   The patient was advised to call back or seek an in-person evaluation if the symptoms worsen or if the condition fails to improve as anticipated.  BH MD OP Progress Note  04/09/2024 2:41 PM CYDNE GRAHN  MRN:  [id number]  Chief Complaint:  Chief Complaint  Patient presents with   Follow-up   Depression   Anxiety   Medication Refill   Discussed the use of AI scribe software for clinical note transcription with the patient, who gave verbal consent to proceed.  History of Present Illness Kimberly Knapp is a 38 year old Caucasian female, married, lives in [place], has a history of generalized anxiety disorder, depression, ADHD was evaluated by telemedicine today.  During the summer, she felt overstimulated while her children were at home, leading to late nights for personal time. She experienced indecisiveness and struggled throughout the summer, contemplating whether to resume antidepressant or anti-anxiety medication.  She found a previous prescription of Lexapro  20 mg, which she split in half to take 10 mg starting last Sunday. She took the medication at night to avoid side effects and because she had difficulty remembering to take it in the morning with her  Adderall. Initially, she did not notice much difference as she was asleep when the medication took effect.  On Friday night, she was unsure if she took her dose and took another half on Saturday morning. Within an hour and a half, she experienced dizziness and nausea, symptoms she associated with vertigo from past viral illnesses. She stayed in bed all day Saturday due to dizziness and nausea, which persisted into Sunday, accompanied by headaches and fatigue.  She stopped taking Lexapro  after Saturday morning. On Sunday, she skipped her morning Adderall, took it in the afternoon with ibuprofen , and felt slightly better by evening, though the headache persisted. On Monday morning, she still felt dizzy and had a central headache, preventing her from taking her children to school.  She has a history of struggling with medication adherence, often stopping due to side effects or forgetting doses. She recalls taking Lexapro  for a few months previously without such severe side effects, though she avoided morning doses due to feeling unwell.  No libido, which has affected her relationship with her husband, noting she has not been intimate for a month and a half. She attributes this partly to Lexapro , which she feels worsened her libido issues.  Denies suicidal thoughts, or thoughts of harming others.        Visit Diagnosis:    ICD-10-CM   1. MDD (major depressive disorder), recurrent episode, moderate (HCC)  F33.1 Vilazodone  HCl (VIIBRYD ) 10 MG TABS    Vilazodone  HCl 20 MG TABS    2. GAD (generalized anxiety disorder)  F41.1 Vilazodone  HCl (VIIBRYD ) 10 MG TABS    Vilazodone  HCl  20 MG TABS    3. Attention deficit hyperactivity disorder (ADHD), predominantly inattentive type  F90.0     4. High risk medication use  Z79.899 Drug Screen 10, Scr w/Conf, Ur      Past Psychiatric History: I have reviewed past psychiatric history from progress note on 10/13/2017.  Past trials of Zoloft, Prozac, Celexa,  Wellbutrin, Adderall.  Past Medical History:  Past Medical History:  Diagnosis Date   ADHD (attention deficit hyperactivity disorder)    Depression    Goiter    Herpes genitalis    Obesity (BMI 30.0-34.9)    Pregnancy induced hypertension     Past Surgical History:  Procedure Laterality Date   BREAST ENHANCEMENT SURGERY Bilateral    CESAREAN SECTION N/A 05/29/2015   Procedure: CESAREAN SECTION;  Surgeon: Mitzie BROCKS [name], MD;  Location: [facility] ORS;  Service: Obstetrics;  Laterality: N/A;   CESAREAN SECTION N/A 06/25/2018   Procedure: CESAREAN SECTION, repeat;  Surgeon: [name], Mitzie BROCKS, MD;  Location: [facility] ORS;  Service: Obstetrics;  Laterality: N/A;   DILATION AND CURETTAGE OF UTERUS     THYROID  SURGERY     thyroid  biopsy   WISDOM TOOTH EXTRACTION      Family Psychiatric History: I have reviewed family psychiatric history from progress note on 10/13/2017.  Family History:  Family History  Problem Relation Age of Onset   Diabetes type II Other    Lung cancer Other    Heart disease Other    Lung cancer Other    ADD / ADHD Father    Alcohol abuse Maternal Grandfather     Social History: I have reviewed social history from progress note on 10/13/2017. Social History   Socioeconomic History   Marital status: Married    Spouse name: michael    Number of children: 3   Years of education: Not on file   Highest education level: Associate degree: occupational, Scientist, product/process development, or vocational program  Occupational History   Occupation: [employer]    Comment: not employed  Tobacco Use   Smoking status: Former    Current packs/day: 0.00    Types: Cigarettes    Quit date: 10/13/2017    Years since quitting: 6.4   Smokeless tobacco: Never  Vaping Use   Vaping status: Every Day  Substance and Sexual Activity   Alcohol use: Yes    Alcohol/week: 1.0 standard drink of alcohol    Types: 1 Glasses of wine per week    Comment: occasional    Drug use: No   Sexual activity: Yes    Partners:  Male    Birth control/protection: None, I.U.D.  Other Topics Concern   Not on file  Social History Narrative   Not on file   Social Drivers of Health   Financial Resource Strain: Low Risk  (10/11/2023)   Received from [facility]   Overall Financial Resource Strain (CARDIA)    Difficulty of Paying Living Expenses: Not very hard  Food Insecurity: No Food Insecurity (10/11/2023)   Received from [facility]   Hunger Vital Sign    Within the past 12 months, you worried that your food would run out before you got the money to buy more.: Never true    Within the past 12 months, the food you bought just didn't last and you didn't have money to get more.: Never true  Transportation Needs: No Transportation Needs (10/11/2023)   Received from [facility]   [facility] - Transportation  In the past 12 months, has lack of transportation kept you from medical appointments or from getting medications?: No    Lack of Transportation (Non-Medical): No  Physical Activity: Inactive (10/13/2017)   Exercise Vital Sign    Days of Exercise per Week: 0 days    Minutes of Exercise per Session: 0 min  Stress: Not on file  Social Connections: Moderately Integrated (10/13/2017)   Social Connection and Isolation Panel    Frequency of Communication with Friends and Family: Three times a week    Frequency of Social Gatherings with Friends and Family: Once a week    Attends Religious Services: More than 4 times per year    Active Member of [employer] or Organizations: No    Attends Banker Meetings: Never    Marital Status: Married    Allergies:  Allergies  Allergen Reactions   Iodine Swelling   Shrimp [Shellfish Allergy] Swelling    Metabolic Disorder Labs: No results found for: HGBA1C, MPG No results found for: PROLACTIN No results found for: CHOL, TRIG, HDL, CHOLHDL, VLDL, LDLCALC Lab Results  Component Value Date   TSH  0.676 08/20/2020   TSH 0.423 (L) 09/26/2017    Therapeutic Level Labs: No results found for: LITHIUM No results found for: VALPROATE No results found for: CBMZ  Current Medications: Current Outpatient Medications  Medication Sig Dispense Refill   Vilazodone  HCl (VIIBRYD ) 10 MG TABS Take 1 tablet (10 mg total) by mouth daily for 7 days. 7 tablet 0   Vilazodone  HCl 20 MG TABS Take 1 tablet (20 mg total) by mouth daily. Start taking after stopping the 10 mg after 7 days 30 tablet 0   amphetamine -dextroamphetamine  (ADDERALL) 20 MG tablet Take 1.5 tablets (30 mg total) by mouth daily. Take 1 tablet daily AM and half tablet daily after lunch 135 tablet 0   traMADol  (ULTRAM ) 50 MG tablet Take 1 tablet (50 mg total) by mouth every 8 (eight) hours as needed for severe pain (pain score 7-10). 15 tablet 0   tretinoin (RETIN-A) 0.025 % cream      valACYclovir  (VALTREX ) 500 MG tablet Take 1 tablet (500 mg total) by mouth 2 (two) times daily. 20 tablet 1   No current facility-administered medications for this visit.     Musculoskeletal: Strength & Muscle Tone: UTA Gait & Station: Seated Patient leans: N/A  Psychiatric Specialty Exam: Review of Systems  Psychiatric/Behavioral:  Positive for dysphoric mood. The patient is nervous/anxious.     There were no vitals taken for this visit.There is no height or weight on file to calculate BMI.  General Appearance: Casual  Eye Contact:  Fair  Speech:  Clear and Coherent  Volume:  Normal  Mood:  Anxious and Depressed  Affect:  Congruent  Thought Process:  Goal Directed and Descriptions of Associations: Intact  Orientation:  Full (Time, Place, and Person)  Thought Content: Logical   Suicidal Thoughts:  No  Homicidal Thoughts:  No  Memory:  Immediate;   Fair Recent;   Fair Remote;   Fair  Judgement:  Fair  Insight:  Fair  Psychomotor Activity:  Normal  Concentration:  Concentration: Fair and Attention Span: Fair  Recall:  [employer]  of Knowledge: Fair  Language: Fair  Akathisia:  No  Handed:  Right  AIMS (if indicated): not done  Assets:  Communication Skills Desire for Improvement Housing Social Support Transportation  ADL's:  Intact  Cognition: WNL  Sleep:  Fair   Screenings: GAD-7  Flowsheet Row Office Visit from 02/07/2024 in [facility] Office Visit from 07/31/2023 in [facility] Video Visit from 01/12/2023 in [facility] Office Visit from 09/29/2022 in [facility] Video Visit from 07/21/2022 in [facility]  Total GAD-7 Score 4 13 3 5 1    PHQ2-9    Flowsheet Row Office Visit from 02/07/2024 in [facility] Office Visit from 07/31/2023 in [facility] Video Visit from 05/02/2023 in [facility] Video Visit from 01/12/2023 in [facility] Office Visit from 09/29/2022 in [facility]  PHQ-2 Total Score 1 2 0 0 2  PHQ-9 Total Score -- 9 -- -- 7   Flowsheet Row Video Visit from 04/08/2024 in [facility] Office Visit from 02/07/2024 in [facility] Video Visit from 10/23/2023 in [facility]  C-SSRS RISK CATEGORY No Risk No Risk No Risk     Assessment and Plan:Kimberly Knapp is a 37 year old Caucasian female who has a history of depression, generalized anxiety disorder, ADHD was evaluated by telemedicine today.  Discussed assessment and plan as noted below.  Assessment & Plan Depression-unstable Chronic depression with exacerbation over the summer, presenting with lack of motivation, indecisiveness, and  decreased libido. Previous SSRI trials (Zoloft, Prozac, Celexa, Wellbutrin) were not well tolerated. Lexapro  caused significant dizziness and nausea. Newer medications like Trintellix and Viibryd  have a better side effect profile. Viibryd  works on serotonin receptors and is indicated for depression. Potential side effects include dizziness, weight gain, and sexual side effects, but these are lower compared to older medications. Therapy is important, but financial constraints are a barrier. - Prescribe Viibryd  (vilazodone ) 10 mg daily for 7 days, then increase to 20 mg daily. - Recommend individual therapy and provide information on [facility] for affordable options.  Generalized anxiety disorder- unstable Current ongoing anxiety mostly related to worsening depression, possible side effects to Lexapro . - Start Viibryd  10 mg daily for 7 days and increase to 20 mg daily - Encouraged to establish care with therapist, provided resources.  ADHD - stable Currently does have mood symptoms, depression and anxiety, possible side effects of Lexapro  affecting focus and concentration. - Continue Adderall 30 mg daily - Currently working on managing depression and anxiety symptoms as noted above. - Noncompliant with urine drug screen, a new lab order was printed out patient to pick this up.  Dizziness and Nausea Acute onset of dizziness and nausea likely related to Lexapro  use, beginning after a possible double dose. Symptoms should resolve as Lexapro  is discontinued. - Discontinue Lexapro . - Consult primary care if symptoms persist.  Follow-up Follow-up in clinic in 3 to 4 weeks or sooner if needed.   Collaboration of Care: Collaboration of Care: Primary Care Provider AEB encouraged to follow up with primary care provider if dizziness and nausea headaches does not improve.  Also encouraged to establish care with the therapist  Patient/Guardian was advised Release of Information must be obtained  prior to any record release in order to collaborate their care with an outside provider. Patient/Guardian was advised if they have not already done so to contact the registration department to sign all necessary forms in order for us  to release information regarding their care.   Consent: Patient/Guardian gives verbal  consent for treatment and assignment of benefits for services provided during this visit. Patient/Guardian expressed understanding and agreed to proceed.   This note was generated in part or whole with voice recognition software. Voice recognition is usually quite accurate but there are transcription errors that can and very often do occur. I apologize for any typographical errors that were not detected and corrected.    Shaune Malacara, MD 04/09/2024, 2:41 PM

## 2024-04-15 ENCOUNTER — Ambulatory Visit: Payer: Self-pay | Admitting: Psychiatry

## 2024-04-15 NOTE — Telephone Encounter (Signed)
 Please inform her that the urine test was negative except for amphetamine  and ethylglucuronide. Dr. Jasper will review the results upon her return next week and provide further guidance if needed.

## 2024-04-16 NOTE — Telephone Encounter (Signed)
 Pt.notified

## 2024-04-16 NOTE — Telephone Encounter (Signed)
-----   Message from Katheren Sleet, MD sent at 04/15/2024  6:14 PM EDT -----   ----- Message ----- From: Rebecka Memos Lab Results In Sent: 04/15/2024   7:36 AM EDT To: Saramma Eappen, MD

## 2024-04-17 LAB — DRUG SCREEN 10, SCR W/CONF, UR
6-Acetylmorphine: NEGATIVE ng/mL
Barbiturates: NEGATIVE ng/mL
Benzodiazepines: NEGATIVE ng/mL
Cocaine Metabolite: NEGATIVE ng/mL
Creatinine: 249 mg/dL (ref >=20)
Fentanyl: NEGATIVE ng/mL
Opiates: NEGATIVE ng/mL
Oxycodone: NEGATIVE ng/mL
Phencyclidine (PCP): NEGATIVE ng/mL
Urine pH: 5.6 (ref 4.5–8.9)

## 2024-04-17 LAB — AMPHETAMINES, MS, UR RFX
AMPHETAMINES SCREEN, URINE: POSITIVE — AB
Amphetamine: >4016 ng/mg{creat}
MDA: NOT DETECTED ng/mg{creat}
MDMA: NOT DETECTED ng/mg{creat}
Methamphetamine: NOT DETECTED ng/mg{creat}

## 2024-04-17 LAB — ALCOHOL BIOMARKERS-CONFIRM
ETHANOL BIOMARKERS: POSITIVE — AB
Ethyl Glucuronide: 4290 ng/mg{creat}
Ethyl Sulfate: 1003 ng/mg{creat}

## 2024-04-22 NOTE — Progress Notes (Signed)
 Attempted to contact patient to discuss lab results, had to leave a voicemail.

## 2024-05-06 ENCOUNTER — Ambulatory Visit (INDEPENDENT_AMBULATORY_CARE_PROVIDER_SITE_OTHER): Admitting: Psychiatry

## 2024-05-06 ENCOUNTER — Encounter: Payer: Self-pay | Admitting: Psychiatry

## 2024-05-06 VITALS — BP 122/80 | HR 75 | Temp 98.5°F | Ht 67.0 in | Wt 205.4 lb

## 2024-05-06 DIAGNOSIS — F331 Major depressive disorder, recurrent, moderate: Secondary | ICD-10-CM | POA: Diagnosis not present

## 2024-05-06 DIAGNOSIS — F900 Attention-deficit hyperactivity disorder, predominantly inattentive type: Secondary | ICD-10-CM | POA: Diagnosis not present

## 2024-05-06 DIAGNOSIS — F411 Generalized anxiety disorder: Secondary | ICD-10-CM | POA: Diagnosis not present

## 2024-05-06 MED ORDER — AMPHETAMINE-DEXTROAMPHETAMINE 20 MG PO TABS: 30.0000 mg | ORAL_TABLET | Freq: Every day | ORAL | 0 refills | Status: DC

## 2024-05-06 NOTE — Progress Notes (Signed)
 [facility] MD OP Progress Note  05/06/2024 9:21 AM Kimberly Knapp  MRN:  [id number]  Chief Complaint:  Chief Complaint  Patient presents with   Follow-up   Depression   Anxiety   Insomnia   Discussed the use of AI scribe software for clinical note transcription with the patient, who gave verbal consent to proceed.  History of Present Illness Kimberly Knapp is a 38 year old Caucasian female, married, lives in [place], has a history of generalized anxiety disorder, depression, ADHD was evaluated in office today for a follow-up appointment.    She reports that her mood has generally been good, though she continues to experience irritability and becomes annoyed with others quickly. During the first 2 weeks after her children returned to school, she describes feeling productive, engaging in decluttering and organizing tasks, but notes that she felt burned out and became less active the following week. She finds adjusting to a new routine challenging, and she acknowledges feeling overwhelmed at times during the day. Ongoing stress related to her daughter's mental health serves as a significant psychosocial stressor for her.  She thinks her anxiety is more under control. She identifies some of her stress as stemming from concerns about her 32 year old daughter, who is experiencing anxiety and self-esteem issues. She describes feeling worried and unsure about how best to support her daughter, who has recently started seeing a therapist.   She is on a  new antidepressant, Viibryd , started approximately 3 weeks ago. She began with 10 mg for the first week and has been taking 20 mg daily for the past 2 weeks. She takes the medication in the morning and has not noticed any side effects, which she finds to be an improvement compared to previous antidepressants. She also reports taking Adderall, with her current regimen being 20 mg in the morning and 10 mg at lunch. She denies any appetite suppression from  Adderall.   Regarding sleep, she reports that she is sleeping better overall. Early school schedules for her children have led her to naturally go to bed earlier, though she sometimes stays up late watching TV or using her phone when feeling overwhelmed. She denies difficulty falling asleep and states that her sleep has improved. She is not currently taking any medication for sleep.  She describes her appetite as stable, though she notes that she has not been as strict with her fasting regimen over the summer. She reports that she is not overeating or experiencing poor appetite and plans to resume fasting to support her health goals. She denies any appetite changes related to her medications.  She denies suicidal ideation and denies thoughts of harming herself or others.  She reports occasional alcohol  use, not weekly and not in excess. She denies binge drinking.    Visit Diagnosis:    ICD-10-CM   1. MDD (major depressive disorder), recurrent episode, moderate (HCC)  F33.1     2. GAD (generalized anxiety disorder)  F41.1     3. Attention deficit hyperactivity disorder (ADHD), predominantly inattentive type  F90.0 amphetamine -dextroamphetamine  (ADDERALL) 20 MG tablet      Past Psychiatric History: I have reviewed past psychiatric history from progress note on 10/13/2017.  Past trials of Zoloft, Prozac, Celexa, Wellbutrin, Adderall  Past Medical History:  Past Medical History:  Diagnosis Date   ADHD (attention deficit hyperactivity disorder)    Depression    Goiter    Herpes genitalis    Obesity (BMI 30.0-34.9)    Pregnancy induced hypertension  Past Surgical History:  Procedure Laterality Date   BREAST ENHANCEMENT SURGERY Bilateral    CESAREAN SECTION N/A 05/29/2015   Procedure: CESAREAN SECTION;  Surgeon: Mitzie BROCKS [name], MD;  Location: [facility] ORS;  Service: Obstetrics;  Laterality: N/A;   CESAREAN SECTION N/A 06/25/2018   Procedure: CESAREAN SECTION, repeat;  Surgeon: [name],  Mitzie BROCKS, MD;  Location: [facility] ORS;  Service: Obstetrics;  Laterality: N/A;   DILATION AND CURETTAGE OF UTERUS     THYROID  SURGERY     thyroid  biopsy   WISDOM TOOTH EXTRACTION      Family Psychiatric History: I have reviewed family psychiatric history from progress note on 10/13/2017.  Family History:  Family History  Problem Relation Age of Onset   Diabetes type II Other    Lung cancer Other    Heart disease Other    Lung cancer Other    ADD / ADHD Father    Alcohol  abuse Maternal Grandfather     Social History: I have reviewed social history from progress note on 10/13/2017. Social History   Socioeconomic History   Marital status: Married    Spouse name: michael    Number of children: 3   Years of education: Not on file   Highest education level: Associate degree: occupational, scientist, product/process development, or vocational program  Occupational History   Occupation: [employer]    Comment: not employed  Tobacco Use   Smoking status: Former    Current packs/day: 0.00    Types: Cigarettes    Quit date: 10/13/2017    Years since quitting: 6.5   Smokeless tobacco: Never  Vaping Use   Vaping status: Every Day  Substance and Sexual Activity   Alcohol  use: Yes    Alcohol /week: 1.0 standard drink of alcohol     Types: 1 Glasses of wine per week    Comment: occasional    Drug use: No   Sexual activity: Yes    Partners: Male    Birth control/protection: None, I.U.D.  Other Topics Concern   Not on file  Social History Narrative   Not on file   Social Drivers of Health   Financial Resource Strain: Low Risk  (10/11/2023)   Received from [facility]   Overall Financial Resource Strain (CARDIA)    Difficulty of Paying Living Expenses: Not very hard  Food Insecurity: No Food Insecurity (10/11/2023)   [facility]   Hunger Vital Sign    Within the past 12 months, you worried that your food would run out before you got the money to buy more.: Never true     Within the past 12 months, the food you bought just didn't last and you didn't have money to get more.: Never true  Transportation Needs: No Transportation Needs (10/11/2023)   Received from [facility] - Transportation    In the past 12 months, has lack of transportation kept you from medical appointments or from getting medications?: No    Lack of Transportation (Non-Medical): No  Physical Activity: Inactive (10/13/2017)   Exercise Vital Sign    Days of Exercise per Week: 0 days    Minutes of Exercise per Session: 0 min  Stress: Not on file  Social Connections: Moderately Integrated (10/13/2017)   Social Connection and Isolation Panel    Frequency of Communication with Friends and Family: Three times a week    Frequency of Social Gatherings with Friends and Family: Once a week    Attends  Religious Services: More than 4 times per year    Active Member of Clubs or Organizations: No    Attends Banker Meetings: Never    Marital Status: Married    Allergies:  Allergies  Allergen Reactions   Iodine Swelling   Shrimp [Shellfish Allergy] Swelling    Metabolic Disorder Labs: No results found for: HGBA1C, MPG No results found for: PROLACTIN No results found for: CHOL, TRIG, HDL, CHOLHDL, VLDL, LDLCALC Lab Results  Component Value Date   TSH 0.676 08/20/2020   TSH 0.423 (L) 09/26/2017    Therapeutic Level Labs: No results found for: LITHIUM No results found for: VALPROATE No results found for: CBMZ  Current Medications: Current Outpatient Medications  Medication Sig Dispense Refill   traMADol  (ULTRAM ) 50 MG tablet Take 1 tablet (50 mg total) by mouth every 8 (eight) hours as needed for severe pain (pain score 7-10). 15 tablet 0   tretinoin (RETIN-A) 0.025 % cream      valACYclovir  (VALTREX ) 500 MG tablet Take 1 tablet (500 mg total) by mouth 2 (two) times daily. 20 tablet 1   Vilazodone  HCl 20 MG TABS Take 1  tablet (20 mg total) by mouth daily. Start taking after stopping the 10 mg after 7 days 30 tablet 0   [START ON 05/25/2024] amphetamine -dextroamphetamine  (ADDERALL) 20 MG tablet Take 1.5 tablets (30 mg total) by mouth daily. Take 1 tablet daily AM and half tablet daily after lunch 135 tablet 0   No current facility-administered medications for this visit.     Musculoskeletal: Strength & Muscle Tone: within normal limits Gait & Station: normal Patient leans: N/A  Psychiatric Specialty Exam: Review of Systems  Psychiatric/Behavioral:  Positive for dysphoric mood. The patient is nervous/anxious.     Blood pressure 122/80, pulse 75, temperature 98.5 F (36.9 C), temperature source Temporal, height 5' 7 (1.702 m), weight 205 lb 6.4 oz (93.2 kg), SpO2 99%.Body mass index is 32.17 kg/m.  General Appearance: Casual  Eye Contact:  Fair  Speech:  Clear and Coherent  Volume:  Normal  Mood:  Anxious and Depressed-situational  Affect:  Appropriate  Thought Process:  Goal Directed and Descriptions of Associations: Intact  Orientation:  Full (Time, Place, and Person)  Thought Content: Logical   Suicidal Thoughts:  No  Homicidal Thoughts:  No  Memory:  Immediate;   Fair Recent;   Fair Remote;   Fair  Judgement:  Fair  Insight:  Fair  Psychomotor Activity:  Normal  Concentration:  Concentration: Fair and Attention Span: Fair  Recall:  [employer] of Knowledge: Fair  Language: Fair  Akathisia:  No  Handed:  Right  AIMS (if indicated): not done  Assets:  Communication Skills Desire for Improvement Social Support  ADL's:  Intact  Cognition: WNL  Sleep:  Improving   Screenings: GAD-7    Loss Adjuster, Chartered Office Visit from 05/06/2024 in [facility] Office Visit from 02/07/2024 in [facility] Office Visit from 07/31/2023 in [facility] Video Visit from 01/12/2023 in [facility] Office Visit from 09/29/2022 in [facility]  Total GAD-7 Score 7 4 13 3 5    PHQ2-9    Flowsheet Row Office Visit from 05/06/2024 in [facility] Office Visit from 02/07/2024 in [facility] Office Visit from 07/31/2023 in [facility] Video Visit from  05/02/2023 in [facility] Video Visit from 01/12/2023 in [facility]  PHQ-2 Total Score 2 1 2  0 0  PHQ-9 Total Score 9 -- 9 -- --   Flowsheet Row Office Visit from 05/06/2024 in [facility] Video Visit from 04/08/2024 in [facility] Office Visit from 02/07/2024 in [facility]  C-SSRS RISK CATEGORY No Risk No Risk No Risk     Assessment and Plan: Kimberly Knapp is a 38 year old Caucasian female who has a history of depression, generalized anxiety disorder, ADHD was evaluated in office today.  Discussed assessment and plan as noted below.  Depression-improving Currently ongoing situational stressors which does affect her mood although Viibryd  has been beneficial.  Would like to give it more time before increasing the dosage. Continue Viibryd  20 mg daily  Generalized anxiety disorder-unstable Currently does have situational challenges which does make her anxious.  Referred for therapy previously has been noncompliant. Encouraged to establish care with therapist, provided resources.  ADHD-stable Currently reports Adderall is beneficial. Continue Adderall 30 mg daily Reviewed [place] PMP AWARxE  Follow-up Follow-up in clinic in 2 months or sooner if needed.  Collaboration of Care: Collaboration of Care: Referral or follow-up with  counselor/therapist AEB encouraged to establish care with therapist, provided resources.  Patient/Guardian was advised Release of Information must be obtained prior to any record release in order to collaborate their care with an outside provider. Patient/Guardian was advised if they have not already done so to contact the registration department to sign all necessary forms in order for us  to release information regarding their care.   Consent: Patient/Guardian gives verbal consent for treatment and assignment of benefits for services provided during this visit. Patient/Guardian expressed understanding and agreed to proceed.  This note was generated in part or whole with voice recognition software. Voice recognition is usually quite accurate but there are transcription errors that can and very often do occur. I apologize for any typographical errors that were not detected and corrected.     Tamanna Whitson, MD 05/07/2024, 12:46 PM

## 2024-05-06 NOTE — Patient Instructions (Signed)
  [email or web address]

## 2024-05-12 ENCOUNTER — Other Ambulatory Visit: Payer: Self-pay | Admitting: Psychiatry

## 2024-05-12 DIAGNOSIS — F411 Generalized anxiety disorder: Secondary | ICD-10-CM

## 2024-05-12 DIAGNOSIS — F331 Major depressive disorder, recurrent, moderate: Secondary | ICD-10-CM

## 2024-08-26 ENCOUNTER — Telehealth: Payer: Self-pay | Admitting: Psychiatry

## 2024-08-26 ENCOUNTER — Telehealth: Payer: Self-pay

## 2024-08-26 DIAGNOSIS — F900 Attention-deficit hyperactivity disorder, predominantly inattentive type: Secondary | ICD-10-CM

## 2024-08-26 MED ORDER — AMPHETAMINE-DEXTROAMPHETAMINE 20 MG PO TABS: 30.0000 mg | ORAL_TABLET | Freq: Every day | ORAL | 0 refills | Status: AC

## 2024-08-26 NOTE — Telephone Encounter (Signed)
 Pt asking for refill on amphetamine -dextroamphetamine  (ADDERALL) 20 MG tablet  Sent to pharmacy [id number] [place], [place] - [address]   Last apt 05/06/24 Next apt NO Follow up on file

## 2024-08-26 NOTE — Telephone Encounter (Signed)
 I have sent Adderall 30 mg to pharmacy as requested.

## 2024-08-26 NOTE — Telephone Encounter (Signed)
 Already taken care of

## 2024-09-10 NOTE — Addendum Note (Signed)
 Encounter addended by: Talaya Lamprecht L on: 09/10/2024 9:22 AM  Actions taken: Imaging Exam ended

## 2024-09-30 ENCOUNTER — Ambulatory Visit: Payer: Self-pay

## 2024-09-30 DIAGNOSIS — D2272 Melanocytic nevi of left lower limb, including hip: Secondary | ICD-10-CM

## 2024-09-30 DIAGNOSIS — Z86018 Personal history of other benign neoplasm: Secondary | ICD-10-CM

## 2024-09-30 DIAGNOSIS — L910 Hypertrophic scar: Secondary | ICD-10-CM | POA: Diagnosis not present

## 2024-09-30 DIAGNOSIS — D489 Neoplasm of uncertain behavior, unspecified: Secondary | ICD-10-CM | POA: Diagnosis not present

## 2024-09-30 DIAGNOSIS — L821 Other seborrheic keratosis: Secondary | ICD-10-CM | POA: Diagnosis not present

## 2024-09-30 DIAGNOSIS — D229 Melanocytic nevi, unspecified: Secondary | ICD-10-CM

## 2024-09-30 DIAGNOSIS — W908XXA Exposure to other nonionizing radiation, initial encounter: Secondary | ICD-10-CM

## 2024-09-30 DIAGNOSIS — Z1283 Encounter for screening for malignant neoplasm of skin: Secondary | ICD-10-CM | POA: Diagnosis not present

## 2024-09-30 DIAGNOSIS — L578 Other skin changes due to chronic exposure to nonionizing radiation: Secondary | ICD-10-CM

## 2024-09-30 DIAGNOSIS — L7211 Pilar cyst: Secondary | ICD-10-CM

## 2024-09-30 DIAGNOSIS — L729 Follicular cyst of the skin and subcutaneous tissue, unspecified: Secondary | ICD-10-CM | POA: Diagnosis not present

## 2024-09-30 DIAGNOSIS — L988 Other specified disorders of the skin and subcutaneous tissue: Secondary | ICD-10-CM | POA: Diagnosis not present

## 2024-09-30 DIAGNOSIS — L814 Other melanin hyperpigmentation: Secondary | ICD-10-CM | POA: Diagnosis not present

## 2024-09-30 DIAGNOSIS — D1801 Hemangioma of skin and subcutaneous tissue: Secondary | ICD-10-CM

## 2024-09-30 MED ORDER — TRETINOIN 0.05 % EX CREA: TOPICAL_CREAM | Freq: Every day | CUTANEOUS | 5 refills | Status: AC

## 2024-09-30 NOTE — Progress Notes (Signed)
 "   Subjective   Kimberly Knapp is a 39 y.o. female who presents for the following: Total body skin exam for skin cancer screening and mole check. The patient has spots, moles and lesions to be evaluated, some may be new or changing and the patient may have concern these could be cancer.. Patient is new patient  Today patient reports: LOC left thigh  Review of Systems:    No other skin or systemic complaints except as noted in HPI or Assessment and Plan.  The following portions of the chart were reviewed this encounter and updated as appropriate: medications, allergies, medical history  Relevant Medical History:  Hx of dysplastic nevi  Objective  (SKPE) Well appearing patient in no apparent distress; mood and affect are within normal limits. Examination was performed of the: Full Skin Examination: scalp, head, eyes, ears, nose, lips, neck, chest, axillae, abdomen, back, buttocks, bilateral upper extremities, bilateral lower extremities, hands, feet, fingers, toes, fingernails, and toenails.   Examination notable for: SKIN EXAM, Angioma(s): Scattered red vascular papule(s)  , Lentigo/lentigines: Scattered pigmented macules that are tan to brown in color and are somewhat non-uniform in shape and concentrated in the sun-exposed areas, Nevus/nevi: Scattered well-demarcated, regular, pigmented macule(s) and/or papule(s)  , Seborrheic Keratosis(es): Stuck-on appearing keratotic papule(s) on the trunk, none  irritated with redness, crusting, edema, and/or partial avulsion, Actinic Damage/Elastosis: chronic sun damage: dyspigmentation, telangiectasia, and wrinkling   Keloid L arm  Examination limited by: Undergarments and Patient deferred removal     Left upper lateral thigh 2mm dark brown macule   Assessment & Plan  (SKAP)   SKIN CANCER SCREENING PERFORMED TODAY.  BENIGN SKIN FINDINGS  - Lentigines  - Seborrheic keratoses  - Hemangiomas   - Nevus/Multiple Benign Nevi  - Keloid on the  left upper arm  - Reassurance provided regarding the benign appearance of lesions noted on exam today; no treatment is indicated in the absence of symptoms/changes. - Reinforced importance of photoprotective strategies including liberal and frequent sunscreen use of a broad-spectrum SPF 30 or greater, use of protective clothing, and sun avoidance for prevention of cutaneous malignancy and photoaging.  Counseled patient on the importance of regular self-skin monitoring as well as routine clinical skin examinations as scheduled.   ACTINIC DAMAGE - Chronic condition, secondary to cumulative UV/sun exposure - Recommend daily broad spectrum sunscreen SPF 30+ to sun-exposed areas, reapply every 2 hours as needed.  - Staying in the shade or wearing long sleeves, sun glasses (UVA+UVB protection) and wide brim hats (4-inch brim around the entire circumference of the hat) are also recommended for sun protection.  - Call for new or changing lesions.  Personal history of dysplastic nevi  - Reviewed medical history for full details  - Reviewed sun protective measures as above - Encouraged full body skin exams     Subcutaneous cyst, favor pilar cyst vs other benign cyst  right parietal scalp x 2 - 1cm cyst, 0.5 cm  - Explained to patient this most likely is consistent with a pilar cyst - Benign, patient reassured - Given that the lesion is symptomatic, patient would like to proceed with excision. They will be scheduled for surgical removal.  Facial elastosis start tretinoin  0.05% cream in the evening. Educated patient about proper use and potential side effects, including dryness, irritation, sun sensitivity, and transient worsening of acne.    Was sun protection counseling provided?: Yes   Level of service outlined above   Patient instructions (SKPI)  Procedures, orders, diagnosis for this visit:  NEOPLASM OF UNCERTAIN BEHAVIOR Left upper lateral thigh - Skin / nail biopsy Type of biopsy:  tangential   Informed consent: discussed and consent obtained   Timeout: patient name, date of birth, surgical site, and procedure verified   Procedure prep:  Patient was prepped and draped in usual sterile fashion Prep type:  Isopropyl alcohol  Anesthesia: the lesion was anesthetized in a standard fashion   Anesthetic:  1% lidocaine  w/ epinephrine 1-100,000 buffered w/ 8.4% NaHCO3 Instrument used: DermaBlade   Hemostasis achieved with: pressure and aluminum chloride   Outcome: patient tolerated procedure well   Post-procedure details: sterile dressing applied and wound care instructions given   Dressing type: bandage and petrolatum    Specimen 1 - Surgical pathology Differential Diagnosis: Dysplastic nevus vs Melanoma vs Nevus   Check Margins: No  Neoplasm of uncertain behavior -     Skin / nail biopsy -     Surgical pathology; Standing  Other orders -     Tretinoin ; Apply topically at bedtime. Apply 2-3 times weekly at night to dry skin after cleaning. Increase frequency up to nightly as tolerated.  Dispense: 20 g; Refill: 5    Return to clinic: Return in about 1 year (around 09/30/2025) for TBSE, Surgery for pilar cyst removal .  I, Emerick Ege, CMA am acting as scribe for Lauraine JAYSON Kanaris, MD.  Documentation: I have reviewed the above documentation for accuracy and completeness, and I agree with the above.  Lauraine JAYSON Kanaris, MD  "

## 2024-09-30 NOTE — Patient Instructions (Addendum)
 Wound Care Instructions  Cleanse wound gently with soap and water once a day then pat dry with clean gauze. Apply a thin coat of Petrolatum (petroleum jelly, Vaseline) over the wound (unless you have an allergy to this). We recommend that you use a new, sterile tube of Vaseline. Do not pick or remove scabs. Do not remove the yellow or white healing tissue from the base of the wound.  Cover the wound with fresh, clean, nonstick gauze and secure with paper tape. You may use Band-Aids in place of gauze and tape if the wound is small enough, but would recommend trimming much of the tape off as there is often too much. Sometimes Band-Aids can irritate the skin.  You should call the office for your biopsy report after 1 week if you have not already been contacted.  If you experience any problems, such as abnormal amounts of bleeding, swelling, significant bruising, significant pain, or evidence of infection, please call the office immediately.  FOR ADULT SURGERY PATIENTS: If you need something for pain relief you may take 1 extra strength Tylenol (acetaminophen) AND 2 Ibuprofen  (200mg  each) together every 4 hours as needed for pain. (do not take these if you are allergic to them or if you have a reason you should not take them.) Typically, you may only need pain medication for 1 to 3 days.        Sunscreen  Who needs sunscreen? Everyone. Sunscreen use can help prevent skin cancer by protecting you from the sun's harmful ultraviolet rays. Anyone can get skin cancer, regardless of age, gender or race. In fact, it is estimated that one in five Americans will develop skin cancer in their lifetime.  Sunscreen alone cannot fully protect you. In addition to wearing sunscreen, dermatologists recommend taking the following steps to protect your skin and find skin cancer early:  Seek shade when appropriate, remembering that the sun's rays are strongest between 10 a.m. and 2 p.m. If your shadow is shorter  than you are, seek shade. Dress to protect yourself from the sun by wearing a lightweight long-sleeved shirt, pants, a wide-brimmed hat and sunglasses, when possible.  Use extra caution near water, snow and sand as they reflect the damaging rays of the sun, which can increase your chance of sunburn.  Get vitamin D  safely through a healthy diet that may include vitamin supplements. Don't seek the sun. Avoid tanning beds. Ultraviolet light from the sun and tanning beds can cause skin cancer and wrinkling. If you want to look tan, you may wish to use a self-tanning product, but continue to use sunscreen with it.  When should I use sunscreen? Every day you go outside--even if you're just walking to and from your form of transportation. The sun emits harmful UV rays year-round. Even on cloudy days, up to 80 percent of the sun's harmful UV rays can penetrate your skin. Snow, sand and water increase the need for sunscreen because they reflect the sun's rays.  How much sunscreen should I use, and how often should I apply it? Most people only apply 25-50 percent of the recommended amount of sunscreen. Apply enough sunscreen to cover all exposed skin. Most adults need about 1 ounce -- or enough to fill a shot glass -- to fully cover their body.  Don't forget to apply to the tops of your feet, your neck, your ears and the top of your head. Apply sunscreen to dry skin 15 minutes before going outdoors.  Skin cancer also  can form on the lips. To protect your lips, apply a lip balm or lipstick that contains sunscreen with an SPF of 30 or higher.  When outdoors, reapply sunscreen approximately every two hours, or after swimming or sweating, according to the directions on the bottle.   Broad-spectrum sunscreens protect against both UVA and UVB rays. What is the difference between the rays? Sunlight consists of two types of harmful rays that reach the earth -- UVA rays and UVB rays. Overexposure to either can lead to  skin cancer. In addition to causing skin cancer, here's what each of these rays do:  UVA rays (or aging rays) can prematurely age your skin, causing wrinkles and age spots, and can pass through window glass. UVB rays (or burning rays) are the primary cause of sunburn and are blocked by window glass  There is no safe way to tan. Every time you tan, you damage your skin. As this damage builds, you speed up the aging of your skin and increase your risk for all types of skin cancer.  What is the difference between chemical and physical sunscreens? Chemical sunscreens work like a sponge, absorbing the sun's rays. They contain one or more of the following active ingredients: oxybenzone, avobenzone, octisalate, octocrylene, homosalate and octinoxate. These formulations tend to be easier to rub into the skin without leaving a white residue.   Physical sunscreens work like a shield, sitting sit on the surface of your skin and deflecting the sun's rays. They contain the active ingredients zinc oxide and/or titanium dioxide. Use this sunscreen if you have sensitive skin.   What type of sunscreen should I use? The best type of sunscreen is the one you will use again and again. Just make sure it offers broad-spectrum (UVA and UVB) protection, has an SPF of 30+, and is water-resistant. The kind of sunscreen you use is a matter of personal choice, and may vary depending on the area of the body to be protected. Available sunscreen options include lotions, creams, gels, ointments, wax sticks and sprays.  Recommended physical sunscreens for face: - Neutrogena Sheer Zinc - Aveeno Positively Mineral Sensitive - CeraVe Hydrating Mineral (also has a tinted version) - La Roche-Posay Anthelios Mineral Face (comes as a cream, lotion, light fluid, and there is also a tinted version).  - EltaMD UV Clear (also has a tinted version)  Recommended physical sunscreens for body: - Neutrogena Sheer Zinc Dry-Touch Sunscreen  Sensitive Skin Lotion Broad Spectrum SPF 50 - Aveeno Positively Mineral Sensitive Skin Sunscreen Broad Spectrum SPF 50 - La Roche-Posay Anthelios SPF 50 Mineral Sunscreen - Gentle Lotion - CeraVe Hydrating Mineral Sunscreen SPF 50  Recommended chemical sunscreens for face: - Anthelios UV Correct Face Sunscreen SPF 70 with Niacinamide - Neutrogena Clear Face Oil-Free SPF 50 with Helioplex - Neutrogena Sport Face Oil-Free SPF 70+ with Helioplex - Aveeno Protect + Hydrate Sunscreen For Face SPF 70 - La Roche-Posay Anthelios Light Fluid Sunscreen for Face SPF 60  Recommended chemical sunscreens for body: - Neutrogena Ultra Sheer Dry-Touch Sunscreen SPF 70 - Aveeno Protect + Hydrate Broad Spectrum All-Day Hydration SPF 60 (comes in a big pump) - La Roche-Posay Anthelios Melt-In Milk Sunscreen SPF 60   Melanoma ABCDEs  Melanoma is the most dangerous type of skin cancer, and is the leading cause of death from skin disease.  You are more likely to develop melanoma if you: Have light-colored skin, light-colored eyes, or red or blond hair Spend a lot of time in the sun  Tan regularly, either outdoors or in a tanning bed Have had blistering sunburns, especially during childhood Have a close family member who has had a melanoma Have atypical moles or large birthmarks  Early detection of melanoma is key since treatment is typically straightforward and cure rates are extremely high if we catch it early.   The first sign of melanoma is often a change in a mole or a new dark spot.  The ABCDE system is a way of remembering the signs of melanoma.  A for asymmetry:  The two halves do not match. B for border:  The edges of the growth are irregular. C for color:  A mixture of colors are present instead of an even brown color. D for diameter:  Melanomas are usually (but not always) greater than 6mm - the size of a pencil eraser. E for evolution:  The spot keeps changing in size, shape, and  color.  Please check your skin once per month between visits. You can use a small mirror in front and a large mirror behind you to keep an eye on the back side or your body.   If you see any new or changing lesions before your next follow-up, please call to schedule a visit.  Please continue daily skin protection including broad spectrum sunscreen SPF 30+ to sun-exposed areas, reapplying every 2 hours as needed when you're outdoors.     Due to recent changes in healthcare laws, you may see results of your pathology and/or laboratory studies on MyChart before the doctors have had a chance to review them. We understand that in some cases there may be results that are confusing or concerning to you. Please understand that not all results are received at the same time and often the doctors may need to interpret multiple results in order to provide you with the best plan of care or course of treatment. Therefore, we ask that you please give us  2 business days to thoroughly review all your results before contacting the office for clarification. Should we see a critical lab result, you will be contacted sooner.   If You Need Anything After Your Visit  If you have any questions or concerns for your doctor, please call our main line at [phone] and press option 4 to reach your doctor's medical assistant. If no one answers, please leave a voicemail as directed and we will return your call as soon as possible. Messages left after 4 pm will be answered the following business day.   You may also send us  a message via MyChart. We typically respond to MyChart messages within 1-2 business days.  For prescription refills, please ask your pharmacy to contact our office. Our fax number is [phone].  If you have an urgent issue when the clinic is closed that cannot wait until the next business day, you can page your doctor at the number below.    Please note that while we do our best to be available for  urgent issues outside of office hours, we are not available 24/7.   If you have an urgent issue and are unable to reach us , you may choose to seek medical care at your doctor's office, retail clinic, urgent care center, or emergency room.  If you have a medical emergency, please immediately call 911 or go to the emergency department.  Pager Numbers  - Dr. Hester: [phone]  - Dr. Jackquline: [phone]  - Dr. Claudene: [phone]   - Dr. Raymund: [phone]  In the  event of inclement weather, please call our main line at [phone] for an update on the status of any delays or closures.  Dermatology Medication Tips: Please keep the boxes that topical medications come in in order to help keep track of the instructions about where and how to use these. Pharmacies typically print the medication instructions only on the boxes and not directly on the medication tubes.   If your medication is too expensive, please contact our office at [phone] option 4 or send us  a message through MyChart.   We are unable to tell what your co-pay for medications will be in advance as this is different depending on your insurance coverage. However, we may be able to find a substitute medication at lower cost or fill out paperwork to get insurance to cover a needed medication.   If a prior authorization is required to get your medication covered by your insurance company, please allow us  1-2 business days to complete this process.  Drug prices often vary depending on where the prescription is filled and some pharmacies may offer cheaper prices.  The website www.goodrx.com contains coupons for medications through different pharmacies. The prices here do not account for what the cost may be with help from insurance (it may be cheaper with your insurance), but the website can give you the price if you did not use any insurance.  - You can print the associated coupon and take it with your prescription to  the pharmacy.  - You may also stop by our office during regular business hours and pick up a GoodRx coupon card.  - If you need your prescription sent electronically to a different pharmacy, notify our office through [facility] or by phone at [phone] option 4.     Si Usted Necesita Algo Despus de Su Visita  Tambin puede enviarnos un mensaje a travs de Clinical cytogeneticist. Por lo general respondemos a los mensajes de MyChart en el transcurso de 1 a 2 das hbiles.  Para renovar recetas, por favor pida a su farmacia que se ponga en contacto con nuestra oficina. Randi lakes de fax es [place] [phone].  Si tiene un asunto urgente cuando la clnica est cerrada y que no puede esperar hasta el siguiente da hbil, puede llamar/localizar a su doctor(a) al nmero que aparece a continuacin.   Por favor, tenga en cuenta que aunque hacemos todo lo posible para estar disponibles para asuntos urgentes fuera del horario de [place], no estamos disponibles las 24 horas del da, los 7 [address]  [address] de la [place].   Si tiene un problema urgente y no puede comunicarse con nosotros, puede optar por buscar atencin mdica  en el consultorio de su doctor(a), en una clnica privada, en un centro de atencin urgente o en una sala de emergencias.  Si tiene Engineer, drilling, por favor llame inmediatamente al 911 o vaya a la sala de emergencias.  Nmeros de bper  - Dr. Hester: [phone]  - Dra. Jackquline: [phone]  - Dr. Claudene: [phone]  - Dra. Kitts: [phone]  En caso de inclemencias del [place], por favor llame a nuestra lnea principal al [phone] para una actualizacin sobre el estado de cualquier retraso o cierre.  Consejos para la medicacin en dermatologa: Por favor, guarde las cajas en las que vienen los medicamentos de uso tpico para ayudarle a seguir las instrucciones sobre dnde y cmo usarlos. Las farmacias generalmente imprimen las instrucciones del medicamento slo en las  cajas y no directamente en los tubos del  medicamento.   Si su medicamento es muy caro, por favor, pngase en contacto con landry rieger llamando al [phone] y presione la opcin 4 o envenos un mensaje a travs de Clinical cytogeneticist.   No podemos decirle cul ser su copago por los medicamentos por adelantado ya que esto es diferente dependiendo de la cobertura de su seguro. Sin embargo, es posible que podamos encontrar un medicamento sustituto a Audiological scientist un formulario para que el seguro cubra el medicamento que se considera necesario.   Si se requiere una autorizacin previa para que su compaa de seguros malta su medicamento, por favor permtanos de 1 a 2 das hbiles para completar este proceso.  Los precios de los medicamentos varan con frecuencia dependiendo del Environmental consultant de dnde se surte la receta y alguna farmacias pueden ofrecer precios ms baratos.  El sitio web www.goodrx.com tiene cupones para medicamentos de Health and safety inspector. Los precios aqu no tienen en cuenta lo que podra costar con la ayuda del seguro (puede ser ms barato con su seguro), pero el sitio web puede darle el precio si no utiliz Tourist information centre manager.  - Puede imprimir el cupn correspondiente y llevarlo con su receta a la farmacia.  - Tambin puede pasar por nuestra oficina durante el horario de atencin regular y Education officer, museum una tarjeta de cupones de GoodRx.  - Si necesita que su receta se enve electrnicamente a una farmacia diferente, informe a nuestra oficina a travs de MyChart de [place] o por telfono llamando al [phone] y presione la opcin 4.

## 2024-10-01 ENCOUNTER — Telehealth: Payer: Self-pay

## 2024-10-01 NOTE — Telephone Encounter (Signed)
-----   Message from Lauraine Kanaris, MD sent at 09/30/2024  1:28 PM EST ----- Regarding: RE: Surgery Appt Hm, I said on a surgery clinic day but maybe she heard surgery center - she can do it here! ----- Message ----- From: Marcine Lewis T Sent: 09/30/2024   1:26 PM EST To: Alan JONELLE Pizza, CMA; Lauraine JAYSON Kanaris, MD; Moll# Subject: Surgery Appt                                   When checking out I went to schedule pt for pilar cyst removal surgery and she stated that it was mentioned to in the room to do it at a surgery center? Told pt I would have a nurse call her regarding specific details.

## 2024-10-01 NOTE — Telephone Encounter (Signed)
Called patient and left VM to return my call. aw 

## 2024-10-03 LAB — SURGICAL PATHOLOGY

## 2024-10-08 ENCOUNTER — Ambulatory Visit: Payer: Self-pay

## 2024-10-08 NOTE — Progress Notes (Signed)
 LMTRC

## 2024-10-09 NOTE — Telephone Encounter (Signed)
-----   Message from Lauraine Kanaris, MD sent at 10/08/2024 10:21 AM EST ----- Please notify patient with below plan: . Skin, left upper lateral thigh :       DYSPLASTIC JUNCTIONAL LENTIGINOUS NEVUS WITH MODERATE ATYPIA, PERIPHERAL MARGIN       INVOLVED    Your biopsy result showed an abnormal mole called a dysplastic nevus.  It is not abnormal enough that we need to do any further treatment, however if the mole were to return (even a little bit) then  we would need to treat it further with another biopsy or surgery.  Please let us  know if you notice this and we will also follow you during your regular skin check visits.  This mole in particular is  not necessarily a precursor to melanoma, but if you have a lot of these abnormal moles it does mean you have a higher risk of melanoma.    Below is some information from the [employer]: ATYPICAL MOLES are unusual-looking benign (noncancerous) moles, also known as dysplastic nevi (the plural of nevus or mole). Atypical moles may resemble melanoma, and people who have them are at  increased risk of developing melanoma in a mole or elsewhere on the body. The higher the number of these moles someone has, the higher the risk. Those who have 10 or more have 12 times the risk of  developing melanoma compared with the general population.   Heredity appears to play a part in the formation of atypical moles. They tend to run in families, especially in Caucasians; about 2 to 8 percent of Caucasians have these moles. Those who have  atypical moles plus a family history of melanoma (two or more close blood relatives with the disease) have a very high risk of developing melanoma. People who have atypical moles, but no family  history of melanoma, are also at higher risk of developing melanoma compared with the general population. So are those with 50 or more normal moles. All of these high-risk individuals should practice  rigorous daily sun protection, perform  a monthly skin self-examination head to toe and seek regular professional skin exams.

## 2024-10-09 NOTE — Telephone Encounter (Signed)
Left message on voicemail to return my call.  

## 2024-10-10 ENCOUNTER — Ambulatory Visit (INDEPENDENT_AMBULATORY_CARE_PROVIDER_SITE_OTHER): Payer: Self-pay | Admitting: Psychiatry

## 2024-10-10 ENCOUNTER — Encounter: Payer: Self-pay | Admitting: Psychiatry

## 2024-10-10 ENCOUNTER — Other Ambulatory Visit: Payer: Self-pay

## 2024-10-10 VITALS — BP 124/78 | HR 80 | Temp 97.6°F | Ht 67.0 in | Wt 204.4 lb

## 2024-10-10 DIAGNOSIS — F900 Attention-deficit hyperactivity disorder, predominantly inattentive type: Secondary | ICD-10-CM

## 2024-10-10 DIAGNOSIS — F411 Generalized anxiety disorder: Secondary | ICD-10-CM | POA: Diagnosis not present

## 2024-10-10 DIAGNOSIS — F3342 Major depressive disorder, recurrent, in full remission: Secondary | ICD-10-CM

## 2024-10-10 NOTE — Telephone Encounter (Signed)
-----   Message from Lauraine Kanaris, MD sent at 10/08/2024 10:21 AM EST ----- Please notify patient with below plan: . Skin, left upper lateral thigh :       DYSPLASTIC JUNCTIONAL LENTIGINOUS NEVUS WITH MODERATE ATYPIA, PERIPHERAL MARGIN       INVOLVED    Your biopsy result showed an abnormal mole called a dysplastic nevus.  It is not abnormal enough that we need to do any further treatment, however if the mole were to return (even a little bit) then  we would need to treat it further with another biopsy or surgery.  Please let us  know if you notice this and we will also follow you during your regular skin check visits.  This mole in particular is  not necessarily a precursor to melanoma, but if you have a lot of these abnormal moles it does mean you have a higher risk of melanoma.    Below is some information from the [employer]: ATYPICAL MOLES are unusual-looking benign (noncancerous) moles, also known as dysplastic nevi (the plural of nevus or mole). Atypical moles may resemble melanoma, and people who have them are at  increased risk of developing melanoma in a mole or elsewhere on the body. The higher the number of these moles someone has, the higher the risk. Those who have 10 or more have 12 times the risk of  developing melanoma compared with the general population.   Heredity appears to play a part in the formation of atypical moles. They tend to run in families, especially in Caucasians; about 2 to 8 percent of Caucasians have these moles. Those who have  atypical moles plus a family history of melanoma (two or more close blood relatives with the disease) have a very high risk of developing melanoma. People who have atypical moles, but no family  history of melanoma, are also at higher risk of developing melanoma compared with the general population. So are those with 50 or more normal moles. All of these high-risk individuals should practice  rigorous daily sun protection, perform  a monthly skin self-examination head to toe and seek regular professional skin exams.

## 2024-10-10 NOTE — Telephone Encounter (Signed)
Left message on voicemail to return my call. Letter sent.  ?

## 2024-10-10 NOTE — Progress Notes (Signed)
 [facility] MD OP Progress Note  10/10/2024 4:36 PM Kimberly Knapp  MRN:  [id number]  Chief Complaint:  Chief Complaint  Patient presents with   Follow-up   Anxiety   Depression   ADD   Medication Refill   Discussed the use of AI scribe software for clinical note transcription with the patient, who gave verbal consent to proceed.  History of Present Illness Kimberly Knapp is a 39 year old Caucasian female, married, lives in [place], has a history of GAD, depression, ADHD was evaluated in office today for a follow-up appointment.  Over the past several months, she has experienced significant overwhelm and difficulty making decisions, which she reports relate to stress and a sense of being out of control. Planning and organizing, activities she previously enjoyed, now feel burdensome, and she feels hesitant and uncomfortable when facing commitments or decisions. Routine tasks, such as preparing her children to go outside, have become overstimulating and contribute to her feeling locked down and unable to relax.  She continues to experience anxiety symptoms, including feeling nervous, on edge, and having trouble relaxing. Worry about household responsibilities and her children's well-being occurs, though she notes this worry is not constant throughout the day. She reports that her relationship with her husband is a significant source of stress, as she feels she carries the mental load for the family and often feels unsupported in decision-making and household management. She describes past relationship dynamics, including feeling unimportant and hurt by her husband's actions, and notes that he has become more involved and supportive recently.  She denies feeling depressed and reports that she did not experience depression in response to her daughter's recent emotional difficulties .  Reports sleep is overall good.  She reports appetite is fair.  She denies any suicidality, homicidality or perceptual  disturbances.  She stopped taking Viibryd  several weeks ago.  She reports she tried going back on it and did not feel good when she tried that.  She had stopped taking it.  She continues to be compliant on Adderall 30 mg daily.  She believes it helps with her ADHD symptoms and she is worried about increasing the dosage since she is worried it may be overstimulating and likely affect her anxiety more.  She would like to hold off making further medication changes at this time.  She is interested in pursuing psychotherapy sessions and is agreeable to referral to in-house therapist.       Visit Diagnosis:    ICD-10-CM   1. Recurrent major depressive disorder, in full remission  F33.42     2. GAD (generalized anxiety disorder)  F41.1     3. Attention deficit hyperactivity disorder (ADHD), predominantly inattentive type  F90.0       Past Psychiatric History: I have reviewed past psychiatric history from progress note on 10/13/2017.  Past trials of Zoloft, Prozac, Celexa, Wellbutrin, Adderall, Viibryd   Past Medical History:  Past Medical History:  Diagnosis Date   ADHD (attention deficit hyperactivity disorder)    Depression    Goiter    Herpes genitalis    Obesity (BMI 30.0-34.9)    Pregnancy induced hypertension     Past Surgical History:  Procedure Laterality Date   BREAST ENHANCEMENT SURGERY Bilateral    CESAREAN SECTION N/A 05/29/2015   Procedure: CESAREAN SECTION;  Surgeon: Mitzie BROCKS [name], MD;  Location: [facility] ORS;  Service: Obstetrics;  Laterality: N/A;   CESAREAN SECTION N/A 06/25/2018   Procedure: CESAREAN SECTION, repeat;  Surgeon: Neomi Mitzie BROCKS, MD;  Location: [facility] ORS;  Service: Obstetrics;  Laterality: N/A;   DILATION AND CURETTAGE OF UTERUS     THYROID  SURGERY     thyroid  biopsy   WISDOM TOOTH EXTRACTION      Family Psychiatric History: Reviewed family psychiatric history from progress note on 10/13/2017.  Family History:  Family History  Problem Relation Age of  Onset   Diabetes type II Other    Lung cancer Other    Heart disease Other    Lung cancer Other    ADD / ADHD Father    Alcohol  abuse Maternal Grandfather     Social History: I have reviewed social history from progress note on 10/13/2017. Social History   Socioeconomic History   Marital status: Married    Spouse name: michael    Number of children: 3   Years of education: Not on file   Highest education level: Associate degree: occupational, scientist, product/process development, or vocational program  Occupational History   Occupation: [employer]    Comment: not employed  Tobacco Use   Smoking status: Former    Current packs/day: 0.00    Types: Cigarettes    Quit date: 10/13/2017    Years since quitting: 7.0   Smokeless tobacco: Never  Vaping Use   Vaping status: Every Day  Substance and Sexual Activity   Alcohol  use: Yes    Alcohol /week: 1.0 standard drink of alcohol     Types: 1 Glasses of wine per week    Comment: occasional    Drug use: No   Sexual activity: Yes    Partners: Male    Birth control/protection: None, I.U.D.  Other Topics Concern   Not on file  Social History Narrative   Not on file   Social Drivers of Health   Tobacco Use: Medium Risk (10/10/2024)   Patient History    Smoking Tobacco Use: Former    Smokeless Tobacco Use: Never    Passive Exposure: Not on file  Financial Resource Strain: Low Risk  (10/11/2023)   Received from [facility]   Overall Financial Resource Strain (CARDIA)    Difficulty of Paying Living Expenses: Not very hard  Food Insecurity: No Food Insecurity (10/11/2023)   Received from [facility]   Epic    Within the past 12 months, you worried that your food would run out before you got the money to buy more.: Never true    Within the past 12 months, the food you bought just didn't last and you didn't have money to get more.: Never true  Transportation Needs: No Transportation Needs (10/11/2023)   Received from [facility] - Transportation    In the past 12 months, has lack of transportation kept you from medical appointments or from getting medications?: No    Lack of Transportation (Non-Medical): No  Physical Activity: Not on file  Stress: Not on file  Social Connections: Not on file  Depression (PHQ2-9): Low Risk (10/10/2024)   Depression (PHQ2-9)    PHQ-2 Score: 1  Alcohol  Screen: Not on file  Housing: Low Risk  (10/11/2023)   Received [facility]   Epic    In the last 12 months, was there a time when you were not able to pay the mortgage or rent on time?: No    In the past 12 months, how many times have you moved where you were living?: 0    At any time in the past 12 months,  were you homeless or living in a shelter (including now)?: No  Utilities: Not At Risk (10/11/2023)   Received from [facility] Utilities    Threatened with loss of utilities: No  Health Literacy: Not on file    Allergies: Allergies[1]  Metabolic Disorder Labs: No results found for: HGBA1C, MPG No results found for: PROLACTIN No results found for: CHOL, TRIG, HDL, CHOLHDL, VLDL, LDLCALC Lab Results  Component Value Date   TSH 0.676 08/20/2020   TSH 0.423 (L) 09/26/2017    Therapeutic Level Labs: No results found for: LITHIUM No results found for: VALPROATE No results found for: CBMZ  Current Medications: Current Outpatient Medications  Medication Sig Dispense Refill   amphetamine -dextroamphetamine  (ADDERALL) 20 MG tablet Take 1.5 tablets (30 mg total) by mouth daily. Take 1 tablet daily AM and half tablet daily after lunch 135 tablet 0   traMADol  (ULTRAM ) 50 MG tablet Take 1 tablet (50 mg total) by mouth every 8 (eight) hours as needed for severe pain (pain score 7-10). 15 tablet 0   tretinoin  (RETIN-A ) 0.025 % cream      tretinoin  (RETIN-A ) 0.05 % cream Apply topically at bedtime. Apply 2-3 times weekly at  night to dry skin after cleaning. Increase frequency up to nightly as tolerated. 20 g 5   valACYclovir  (VALTREX ) 500 MG tablet Take 1 tablet (500 mg total) by mouth 2 (two) times daily. 20 tablet 1   No current facility-administered medications for this visit.     Musculoskeletal: Strength & Muscle Tone: within normal limits Gait & Station: normal Patient leans: N/A  Psychiatric Specialty Exam: Review of Systems  Psychiatric/Behavioral:  The patient is nervous/anxious.     Blood pressure 124/78, pulse 80, temperature 97.6 F (36.4 C), temperature source Temporal, height 5' 7 (1.702 m), weight 204 lb 6.4 oz (92.7 kg).Body mass index is 32.01 kg/m.  General Appearance: Fairly Groomed  Eye Contact:  Fair  Speech:  Normal Rate  Volume:  Normal  Mood:  Anxious  Affect:  Appropriate  Thought Process:  Goal Directed and Descriptions of Associations: Intact  Orientation:  Full (Time, Place, and Person)  Thought Content: Logical   Suicidal Thoughts:  No  Homicidal Thoughts:  No  Memory:  Immediate;   Fair Recent;   Fair Remote;   Fair  Judgement:  Fair  Insight:  Fair  Psychomotor Activity:  Normal  Concentration:  Concentration: Fair and Attention Span: Fair  Recall:  [employer] of Knowledge: Fair  Language: Fair  Akathisia:  No  Handed:  Right  AIMS (if indicated): not done  Assets:  Manufacturing Systems Engineer Desire for Improvement Housing Social Support Transportation Vocational/Educational  ADL's:  Intact  Cognition: WNL  Sleep:  Fair   Screenings: GAD-7    Garment/textile Technologist Visit from 10/10/2024 in [facility] Office Visit from 05/06/2024 in [facility] Office Visit from 02/07/2024 in [facility] Office Visit from 07/31/2023 in [facility] Video Visit from 01/12/2023 in [facility]  Total GAD-7 Score 3 7 4 13 3    PHQ2-9    Flowsheet Row Office Visit from 10/10/2024 in [facility] Office Visit from 05/06/2024 in [facility] Office Visit from 02/07/2024 in [facility] Office Visit from 07/31/2023 in [facility] Video Visit  from 05/02/2023 in [facility]  PHQ-2 Total Score 1 2 1 2  0  PHQ-9 Total Score -- 9 -- 9 --   Flowsheet Row Office Visit from 10/10/2024 in [facility] Office Visit from 05/06/2024 in [facility] Video Visit from 04/08/2024 in [facility]  C-SSRS RISK CATEGORY No Risk No Risk No Risk     Assessment and Plan: BRUNETTE LAVALLE is a 39 year old Caucasian female who presented for a follow-up appointment, discussed assessment and plan as noted below.  1. Recurrent major depressive disorder, in full remission Currently denies any significant depression symptoms Noncompliant on Viibryd  and is not interested in going back on it at this time. Referral for CBT. Will reevaluate in future session for initiation of an antidepressant.  2. GAD (generalized anxiety disorder)-unstable On going anxiety symptoms likely multifactorial including relationship challenges. Will refer to in-house psychotherapist. Patient declines initiation of an SSRI or SNRI.  Will reevaluate in future sessions.  3. Attention deficit hyperactivity disorder (ADHD), predominantly inattentive type-improving Does report problems with initiating task however attributes that to likely being overwhelmed all the time.  Not interested in making any changes with her Adderall dosage or switching to another stimulant at this time. Referral for in-house  therapy Continue Adderall 30 mg daily Patient advised she could reduce the Adderall dosage to half the dose for 2 weeks and monitor her results to see if that does help with her anxiety better. Reviewed [place]MP AWARxE   Follow-up Follow-up in clinic in 4 to 5 months or sooner in person.  Patient advised to pursue psychotherapy in the meantime.    Collaboration of Care: Collaboration of Care: Referral or follow-up with counselor/therapist AEB patient referred for in-house therapy.  Patient/Guardian was advised Release of Information must be obtained prior to any record release in order to collaborate their care with an outside provider. Patient/Guardian was advised if they have not already done so to contact the registration department to sign all necessary forms in order for us  to release information regarding their care.   Consent: Patient/Guardian gives verbal consent for treatment and assignment of benefits for services provided during this visit. Patient/Guardian expressed understanding and agreed to proceed.  This note was generated in part or whole with voice recognition software. Voice recognition is usually quite accurate but there are transcription errors that can and very often do occur. I apologize for any typographical errors that were not detected and corrected.     Nafis Farnan, MD 10/11/2024, 12:26 PM     [1]  Allergies Allergen Reactions   Iodine Swelling   Shrimp [Shellfish Allergy] Swelling

## 2025-02-06 ENCOUNTER — Ambulatory Visit: Admitting: Psychiatry

## 2025-10-01 ENCOUNTER — Ambulatory Visit
